# Patient Record
Sex: Female | Born: 1945 | Race: White | Hispanic: No | Marital: Married | Smoking: Never smoker
Health system: Southern US, Community
[De-identification: ages and names within clinical notes are randomized; demographics above are authoritative.]

## PROBLEM LIST (undated history)

## (undated) DIAGNOSIS — F32A Depression, unspecified: Secondary | ICD-10-CM

## (undated) DIAGNOSIS — IMO0002 Reserved for concepts with insufficient information to code with codable children: Secondary | ICD-10-CM

## (undated) DIAGNOSIS — N3289 Other specified disorders of bladder: Secondary | ICD-10-CM

## (undated) DIAGNOSIS — G4730 Sleep apnea, unspecified: Secondary | ICD-10-CM

## (undated) DIAGNOSIS — E7800 Pure hypercholesterolemia, unspecified: Secondary | ICD-10-CM

## (undated) DIAGNOSIS — K219 Gastro-esophageal reflux disease without esophagitis: Secondary | ICD-10-CM

## (undated) DIAGNOSIS — F419 Anxiety disorder, unspecified: Secondary | ICD-10-CM

## (undated) DIAGNOSIS — G5700 Lesion of sciatic nerve, unspecified lower limb: Secondary | ICD-10-CM

## (undated) DIAGNOSIS — M1990 Unspecified osteoarthritis, unspecified site: Secondary | ICD-10-CM

## (undated) DIAGNOSIS — I10 Essential (primary) hypertension: Secondary | ICD-10-CM

## (undated) DIAGNOSIS — Z87442 Personal history of urinary calculi: Secondary | ICD-10-CM

## (undated) HISTORY — DX: Reserved for concepts with insufficient information to code with codable children: IMO0002

## (undated) HISTORY — DX: Gastro-esophageal reflux disease without esophagitis: K21.9

## (undated) HISTORY — PX: UPPER GI ENDOSCOPY: SHX6162

## (undated) HISTORY — PX: ABDOMINAL HYSTERECTOMY: SHX81

## (undated) HISTORY — PX: COLONOSCOPY: SHX174

## (undated) HISTORY — PX: TONSILLECTOMY: SUR1361

## (undated) HISTORY — PX: BREAST BIOPSY: SHX20

## (undated) HISTORY — DX: Lesion of sciatic nerve, unspecified lower limb: G57.00

## (undated) HISTORY — PX: TUBAL LIGATION: SHX77

---

## 1998-01-27 HISTORY — PX: OTHER SURGICAL HISTORY: SHX169

## 1998-08-21 ENCOUNTER — Inpatient Hospital Stay (HOSPITAL_COMMUNITY): Admission: RE | Admit: 1998-08-21 | Discharge: 1998-08-23 | Payer: Self-pay | Admitting: Obstetrics and Gynecology

## 1999-12-10 ENCOUNTER — Other Ambulatory Visit: Admission: RE | Admit: 1999-12-10 | Discharge: 1999-12-10 | Payer: Self-pay | Admitting: Obstetrics and Gynecology

## 2000-12-11 ENCOUNTER — Other Ambulatory Visit: Admission: RE | Admit: 2000-12-11 | Discharge: 2000-12-11 | Payer: Self-pay | Admitting: Obstetrics and Gynecology

## 2003-10-27 ENCOUNTER — Ambulatory Visit (HOSPITAL_COMMUNITY): Admission: RE | Admit: 2003-10-27 | Discharge: 2003-10-27 | Payer: Self-pay | Admitting: Family Medicine

## 2010-10-29 ENCOUNTER — Ambulatory Visit: Payer: Self-pay | Admitting: Gastroenterology

## 2010-10-30 ENCOUNTER — Encounter: Payer: Self-pay | Admitting: Gastroenterology

## 2010-10-30 ENCOUNTER — Other Ambulatory Visit: Payer: Self-pay | Admitting: Gastroenterology

## 2010-10-30 ENCOUNTER — Ambulatory Visit (INDEPENDENT_AMBULATORY_CARE_PROVIDER_SITE_OTHER): Payer: BC Managed Care – PPO | Admitting: Gastroenterology

## 2010-10-30 VITALS — BP 125/75 | HR 70 | Temp 97.0°F | Ht 60.0 in | Wt 225.6 lb

## 2010-10-30 DIAGNOSIS — Z860100 Personal history of colon polyps, unspecified: Secondary | ICD-10-CM

## 2010-10-30 DIAGNOSIS — K5900 Constipation, unspecified: Secondary | ICD-10-CM | POA: Insufficient documentation

## 2010-10-30 DIAGNOSIS — K219 Gastro-esophageal reflux disease without esophagitis: Secondary | ICD-10-CM

## 2010-10-30 MED ORDER — POLYETHYLENE GLYCOL 3350 GRAN: 17.0000 g | GRANULES | Freq: Every day | Status: DC | PRN

## 2010-10-30 NOTE — Progress Notes (Signed)
Cc to PCP 

## 2010-10-30 NOTE — Patient Instructions (Addendum)
We have scheduled you for an upper endoscopy and colonoscopy with Dr. Jena Gauss. Please see separate instructions. Miralax prn constipation.

## 2010-10-30 NOTE — Assessment & Plan Note (Signed)
Intermittent constipation. May take MiraLax 17 g daily as needed. Prescription sent to [facility].

## 2010-10-30 NOTE — Assessment & Plan Note (Signed)
Chronic GERD. No alarm symptoms. She's never had an EGD. She is to have one to rule out Barrett's esophagus.  I have discussed the risks, alternatives, benefits with regards to but not limited to the risk of reaction to medication, bleeding, infection, perforation and the patient is agreeable to proceed. Written consent to be obtained.  Continue omeprazole once daily. May use prn TUMS. May need to change PPI if abnormal EGD.

## 2010-10-30 NOTE — Progress Notes (Signed)
Primary Care Physician:  Criss Rosales, MD  Primary Gastroenterologist:  Roetta Sessions, MD   Chief Complaint  Patient presents with  . Colonoscopy    HPI:  LAHARI SUTTLES is a 65 y.o. female here to schedule colonoscopy. She states it's been more than 12 years since her last colonoscopy with Dr. Jena Gauss. She thinks she had polyps.  Unfortunately her records are not available. She has a history of chronic heartburn. Some breakthrough symptoms on omeprazole. Nexium worked better but cannot afford. No prior EGD. GERD for over 10 years. Brother with bad GERD. Uses TUMS with it. More trouble with bladder than anything, frequency, spasms, nocturnal urination.  No dysphagia.   Current Outpatient Prescriptions  Medication Sig Dispense Refill  . albuterol (PROVENTIL HFA;VENTOLIN HFA) 108 (90 BASE) MCG/ACT inhaler Inhale 2 puffs into the lungs every 6 (six) hours as needed.        Marland Kitchen aspirin 81 MG tablet Take 81 mg by mouth daily.        Marland Kitchen atorvastatin (LIPITOR) 40 MG tablet Take 40 mg by mouth daily.        . celecoxib (CELEBREX) 200 MG capsule Take 200 mg by mouth daily.       . cyclobenzaprine (FLEXERIL) 10 MG tablet Take 10 mg by mouth 3 (three) times daily as needed.       Marland Kitchen estrogens, conjugated, (PREMARIN) 0.625 MG tablet Take 0.625 mg by mouth daily. Takes twice weekly      . Fluticasone-Salmeterol (ADVAIR) 100-50 MCG/DOSE AEPB Inhale 1 puff into the lungs every 12 (twelve) hours.        Marland Kitchen omeprazole (PRILOSEC) 20 MG capsule Take 20 mg by mouth daily.        Marland Kitchen tolterodine (DETROL LA) 4 MG 24 hr capsule Take 4 mg by mouth daily.        Marland Kitchen venlafaxine (EFFEXOR-XR) 150 MG 24 hr capsule Take 150 mg by mouth daily.          Allergies as of 10/30/2010 - Review Complete 10/30/2010  Allergen Reaction Noted  . Penicillins Rash 10/30/2010    Past Medical History  Diagnosis Date  . GERD (gastroesophageal reflux disease)   . DDD (degenerative disc disease)   . Sciatic nerve disease   . Asthma       Past Surgical History  Procedure Date  . Colonoscopy     12 years ago, Dr. Jena Gauss, polyp  . Tonsillectomy   . Complete hysterectomy 2000    fibroids  . Breast biopsy     "precancer"    Family History  Problem Relation Age of Onset  . Colon cancer Maternal Grandmother   . Breast cancer Mother   . Breast cancer Maternal Aunt   . Ovarian cancer Neg Hx   . Heart disease      father, brother  . Lung cancer Maternal Grandfather   . Inflammatory bowel disease Son     ?crohn's, [facility] on Remicade  . GI problems Brother     Bowel perforation    History   Social History  . Marital Status: Single    Spouse Name: N/A    Number of Children: 3  . Years of Education: N/A   Occupational History  . retired     Geologist, engineering   Social History Main Topics  . Smoking status: Never Smoker   . Smokeless tobacco: Not on file  . Alcohol Use: No  . Drug Use: No  . Sexually Active:  Not on file   Other Topics Concern  . Not on file   Social History Narrative  . No narrative on file      ROS:  General: Negative for anorexia, weight loss, fever, chills, fatigue, weakness. Eyes: Negative for vision changes.  ENT: Negative for hoarseness, difficulty swallowing , nasal congestion. CV: Negative for chest pain, angina, palpitations, dyspnea on exertion, peripheral edema.  Respiratory: Negative for dyspnea at rest, dyspnea on exertion, cough, sputum, wheezing.  GI: See history of present illness. GU:  Negative for dysuria, hematuria, urinary incontinence, urinary frequency, nocturnal urination.  MS: Negative for joint pain, low back pain.  Derm: Negative for rash or itching.  Neuro: Negative for weakness, abnormal sensation, seizure, frequent headaches, memory loss, confusion.  Psych: Negative for anxiety, depression, suicidal ideation, hallucinations.  Endo: Negative for unusual weight change.  Heme: Negative for bruising or bleeding. Allergy: Negative for rash or  hives.    Physical Examination:  BP 125/75  Pulse 70  Temp(Src) 97 F (36.1 C) (Temporal)  Ht 5' (1.524 m)  Wt 225 lb 9.6 oz (102.331 kg)  BMI 44.06 kg/m2   General: Well-nourished, well-developed in no acute distress.  Head: Normocephalic, atraumatic.   Eyes: Conjunctiva pink, no icterus. Mouth: Oropharyngeal mucosa moist and pink , no lesions erythema or exudate. Neck: Supple without thyromegaly, masses, or lymphadenopathy.  Lungs: Clear to auscultation bilaterally.  Heart: Regular rate and rhythm, no murmurs rubs or gallops.  Abdomen: Bowel sounds are normal, nontender, nondistended, no hepatosplenomegaly or masses, no abdominal bruits or    hernia , no rebound or guarding.   Rectal: Not performed. Deferred to time of colonoscopy. Extremities: No lower extremity edema. No clubbing or deformities.  Neuro: Alert and oriented x 4 , grossly normal neurologically.  Skin: Warm and dry, no rash or jaundice.   Psych: Alert and cooperative, normal mood and affect.

## 2010-10-30 NOTE — Assessment & Plan Note (Signed)
Due for surveillance colonoscopy.  I have discussed the risks, alternatives, benefits with regards to but not limited to the risk of reaction to medication, bleeding, infection, perforation and the patient is agreeable to proceed. Written consent to be obtained.  

## 2010-11-22 MED ORDER — SODIUM CHLORIDE 0.45 % IV SOLN: Freq: Once | INTRAVENOUS | Status: DC

## 2010-11-25 ENCOUNTER — Encounter (HOSPITAL_COMMUNITY): Payer: Self-pay

## 2010-11-25 ENCOUNTER — Ambulatory Visit (HOSPITAL_COMMUNITY)
Admission: RE | Admit: 2010-11-25 | Discharge: 2010-11-25 | Disposition: A | Discharge status: Home / Self Care | Payer: BC Managed Care – PPO | Source: Ambulatory Visit | Attending: Internal Medicine | Admitting: Internal Medicine

## 2010-11-25 ENCOUNTER — Encounter (HOSPITAL_COMMUNITY)
Admission: RE | Disposition: A | Discharge status: Home / Self Care | Payer: Self-pay | Source: Ambulatory Visit | Attending: Internal Medicine

## 2010-11-25 DIAGNOSIS — K219 Gastro-esophageal reflux disease without esophagitis: Secondary | ICD-10-CM

## 2010-11-25 DIAGNOSIS — Z860100 Personal history of colon polyps, unspecified: Secondary | ICD-10-CM | POA: Insufficient documentation

## 2010-11-25 DIAGNOSIS — Z1211 Encounter for screening for malignant neoplasm of colon: Secondary | ICD-10-CM

## 2010-11-25 HISTORY — DX: Anxiety disorder, unspecified: F41.9

## 2010-11-25 HISTORY — DX: Other specified disorders of bladder: N32.89

## 2010-11-25 HISTORY — DX: Pure hypercholesterolemia, unspecified: E78.00

## 2010-11-25 HISTORY — DX: Sleep apnea, unspecified: G47.30

## 2010-11-25 SURGERY — COLONOSCOPY WITH ESOPHAGOGASTRODUODENOSCOPY (EGD)
Anesthesia: Moderate Sedation

## 2010-11-25 MED ORDER — STERILE WATER FOR IRRIGATION IR SOLN
Status: DC | PRN
  Administered 2010-11-25: 08:00:00

## 2010-11-25 MED ORDER — MIDAZOLAM HCL 5 MG/5ML IJ SOLN
INTRAMUSCULAR | Status: DC | PRN
  Administered 2010-11-25: 1 mg via INTRAVENOUS
  Administered 2010-11-25 (×2): 2 mg via INTRAVENOUS

## 2010-11-25 MED ORDER — MEPERIDINE HCL 100 MG/ML IJ SOLN
INTRAMUSCULAR | Status: DC | PRN
  Administered 2010-11-25 (×3): 25 mg

## 2010-11-25 MED ORDER — BUTAMBEN-TETRACAINE-BENZOCAINE 2-2-14 % EX AERO
INHALATION_SPRAY | CUTANEOUS | Status: DC | PRN
  Administered 2010-11-25: 2 via TOPICAL

## 2010-11-25 MED ORDER — MIDAZOLAM HCL 5 MG/5ML IJ SOLN
INTRAMUSCULAR | Status: AC
  Filled 2010-11-25: qty 10

## 2010-11-25 MED ORDER — LIDOCAINE HCL 2 % EX GEL
CUTANEOUS | Status: DC | PRN
  Administered 2010-11-25: 1 via TOPICAL

## 2010-11-25 MED ORDER — MEPERIDINE HCL 100 MG/ML IJ SOLN
INTRAMUSCULAR | Status: AC
  Filled 2010-11-25: qty 2

## 2010-11-25 MED ORDER — LIDOCAINE HCL 2 % EX GEL
CUTANEOUS | Status: AC
  Filled 2010-11-25: qty 30

## 2010-11-25 NOTE — H&P (Signed)
  I have seen & examined the patient prior to the procedure(s) today and reviewed the history and physical/consultation.  There have been no changes.  After consideration of the risks, benefits, alternatives and imponderables, the patient has consented to the procedure(s).   

## 2010-12-26 NOTE — Progress Notes (Signed)
REVIEWED.  

## 2013-11-07 ENCOUNTER — Encounter: Payer: Self-pay | Admitting: Internal Medicine

## 2016-07-28 NOTE — Patient Instructions (Signed)
Your procedure is scheduled on: 08/04/2016  Report to [facility] at  900  AM.  Call this number if you have problems the morning of surgery: [phone]   Do not eat food or drink liquids :After Midnight.      Take these medicines the morning of surgery with A SIP OF WATER: celexa, flexaril, prilosec, detrol LA, effexor.   Do not wear jewelry, make-up or nail polish.  Do not wear lotions, powders, or perfumes. You may wear deodorant.  Do not shave 48 hours prior to surgery.  Do not bring valuables to the hospital.  Contacts, dentures or bridgework may not be worn into surgery.  Leave suitcase in the car. After surgery it may be brought to your room.  For patients admitted to the hospital, checkout time is 11:00 AM the day of discharge.   Patients discharged the day of surgery will not be allowed to drive home.  :     Please read over the following fact sheets that you were given: Coughing and Deep Breathing, Surgical Site Infection Prevention, Anesthesia Post-op Instructions and Care and Recovery After Surgery    Cataract A cataract is a clouding of the lens of the eye. When a lens becomes cloudy, vision is reduced based on the degree and nature of the clouding. Many cataracts reduce vision to some degree. Some cataracts make people more near-sighted as they develop. Other cataracts increase glare. Cataracts that are ignored and become worse can sometimes look white. The white color can be seen through the pupil. CAUSES   Aging. However, cataracts may occur at any age, even in newborns.   Certain drugs.   Trauma to the eye.   Certain diseases such as diabetes.   Specific eye diseases such as chronic inflammation inside the eye or a sudden attack of a rare form of glaucoma.   Inherited or acquired medical problems.  SYMPTOMS   Gradual, progressive drop in vision in the affected eye.   Severe, rapid visual loss. This most often happens when trauma is the cause.  DIAGNOSIS  To  detect a cataract, an eye doctor examines the lens. Cataracts are best diagnosed with an exam of the eyes with the pupils enlarged (dilated) by drops.  TREATMENT  For an early cataract, vision may improve by using different eyeglasses or stronger lighting. If that does not help your vision, surgery is the only effective treatment. A cataract needs to be surgically removed when vision loss interferes with your everyday activities, such as driving, reading, or watching TV. A cataract may also have to be removed if it prevents examination or treatment of another eye problem. Surgery removes the cloudy lens and usually replaces it with a substitute lens (intraocular lens, IOL).  At a time when both you and your doctor agree, the cataract will be surgically removed. If you have cataracts in both eyes, only one is usually removed at a time. This allows the operated eye to heal and be out of danger from any possible problems after surgery (such as infection or poor wound healing). In rare cases, a cataract may be doing damage to your eye. In these cases, your caregiver may advise surgical removal right away. The vast majority of people who have cataract surgery have better vision afterward. HOME CARE INSTRUCTIONS  If you are not planning surgery, you may be asked to do the following:  Use different eyeglasses.   Use stronger or brighter lighting.   Ask your eye doctor about  reducing your medicine dose or changing medicines if it is thought that a medicine caused your cataract. Changing medicines does not make the cataract go away on its own.   Become familiar with your surroundings. Poor vision can lead to injury. Avoid bumping into things on the affected side. You are at a higher risk for tripping or falling.   Exercise extreme care when driving or operating machinery.   Wear sunglasses if you are sensitive to bright light or experiencing problems with glare.  SEEK IMMEDIATE MEDICAL CARE IF:   You have  a worsening or sudden vision loss.   You notice redness, swelling, or increasing pain in the eye.   You have a fever.  Document Released: 01/13/2005 Document Revised: 01/02/2011 Document Reviewed: 09/06/2010 [facility] Patient Information 2012 [place].PATIENT INSTRUCTIONS POST-ANESTHESIA  IMMEDIATELY FOLLOWING SURGERY:  Do not drive or operate machinery for the first twenty four hours after surgery.  Do not make any important decisions for twenty four hours after surgery or while taking narcotic pain medications or sedatives.  If you develop intractable nausea and vomiting or a severe headache please notify your doctor immediately.  FOLLOW-UP:  Please make an appointment with your surgeon as instructed. You do not need to follow up with anesthesia unless specifically instructed to do so.  WOUND CARE INSTRUCTIONS (if applicable):  Keep a dry clean dressing on the anesthesia/puncture wound site if there is drainage.  Once the wound has quit draining you may leave it open to air.  Generally you should leave the bandage intact for twenty four hours unless there is drainage.  If the epidural site drains for more than 36-48 hours please call the anesthesia department.  QUESTIONS?:  Please feel free to call your physician or the hospital operator if you have any questions, and they will be happy to assist you.

## 2016-07-29 ENCOUNTER — Encounter (HOSPITAL_COMMUNITY)
Admission: RE | Admit: 2016-07-29 | Discharge: 2016-07-29 | Disposition: A | Discharge status: Home / Self Care | Payer: Medicare Other | Source: Ambulatory Visit | Attending: Ophthalmology | Admitting: Ophthalmology

## 2016-07-29 ENCOUNTER — Encounter (HOSPITAL_COMMUNITY): Payer: Self-pay

## 2016-07-29 DIAGNOSIS — E7800 Pure hypercholesterolemia, unspecified: Secondary | ICD-10-CM | POA: Insufficient documentation

## 2016-07-29 DIAGNOSIS — N3289 Other specified disorders of bladder: Secondary | ICD-10-CM | POA: Diagnosis not present

## 2016-07-29 DIAGNOSIS — K5900 Constipation, unspecified: Secondary | ICD-10-CM | POA: Insufficient documentation

## 2016-07-29 DIAGNOSIS — Z01812 Encounter for preprocedural laboratory examination: Secondary | ICD-10-CM | POA: Insufficient documentation

## 2016-07-29 DIAGNOSIS — F419 Anxiety disorder, unspecified: Secondary | ICD-10-CM | POA: Insufficient documentation

## 2016-07-29 DIAGNOSIS — Z01810 Encounter for preprocedural cardiovascular examination: Secondary | ICD-10-CM | POA: Insufficient documentation

## 2016-07-29 DIAGNOSIS — Z86010 Personal history of colonic polyps: Secondary | ICD-10-CM | POA: Insufficient documentation

## 2016-07-29 DIAGNOSIS — K219 Gastro-esophageal reflux disease without esophagitis: Secondary | ICD-10-CM | POA: Insufficient documentation

## 2016-07-29 DIAGNOSIS — I252 Old myocardial infarction: Secondary | ICD-10-CM | POA: Diagnosis not present

## 2016-07-29 LAB — CBC
HCT: 44.9 % (ref 36.0–46.0)
Hemoglobin: 14.8 g/dL (ref 12.0–15.0)
MCH: 32 pg (ref 26.0–34.0)
MCHC: 33 g/dL (ref 30.0–36.0)
MCV: 97 fL (ref 78.0–100.0)
PLATELETS: 233 10*3/uL (ref 150–400)
RBC: 4.63 MIL/uL (ref 3.87–5.11)
RDW: 13.8 % (ref 11.5–15.5)
WBC: 6.4 10*3/uL (ref 4.0–10.5)

## 2016-07-29 LAB — BASIC METABOLIC PANEL
Anion gap: 12 (ref 5–15)
BUN: 16 mg/dL (ref 6–20)
CALCIUM: 10.2 mg/dL (ref 8.9–10.3)
CO2: 24 mmol/L (ref 22–32)
CREATININE: 0.57 mg/dL (ref 0.44–1.00)
Chloride: 105 mmol/L (ref 101–111)
GFR calc Af Amer: >60 mL/min (ref >60)
Glucose, Bld: 94 mg/dL (ref 65–99)
POTASSIUM: 3.7 mmol/L (ref 3.5–5.1)
SODIUM: 141 mmol/L (ref 135–145)

## 2016-08-04 ENCOUNTER — Ambulatory Visit (HOSPITAL_COMMUNITY): Payer: Medicare Other | Admitting: Anesthesiology

## 2016-08-04 ENCOUNTER — Ambulatory Visit (HOSPITAL_COMMUNITY)
Admission: RE | Admit: 2016-08-04 | Discharge: 2016-08-04 | Disposition: A | Discharge status: Home / Self Care | Payer: Medicare Other | Source: Ambulatory Visit | Attending: Ophthalmology | Admitting: Ophthalmology

## 2016-08-04 ENCOUNTER — Encounter (HOSPITAL_COMMUNITY)
Admission: RE | Disposition: A | Discharge status: Home / Self Care | Payer: Self-pay | Source: Ambulatory Visit | Attending: Ophthalmology

## 2016-08-04 ENCOUNTER — Encounter (HOSPITAL_COMMUNITY): Payer: Self-pay

## 2016-08-04 DIAGNOSIS — K219 Gastro-esophageal reflux disease without esophagitis: Secondary | ICD-10-CM | POA: Insufficient documentation

## 2016-08-04 DIAGNOSIS — F17200 Nicotine dependence, unspecified, uncomplicated: Secondary | ICD-10-CM | POA: Diagnosis not present

## 2016-08-04 DIAGNOSIS — G4730 Sleep apnea, unspecified: Secondary | ICD-10-CM | POA: Insufficient documentation

## 2016-08-04 DIAGNOSIS — H25042 Posterior subcapsular polar age-related cataract, left eye: Secondary | ICD-10-CM | POA: Diagnosis present

## 2016-08-04 DIAGNOSIS — F419 Anxiety disorder, unspecified: Secondary | ICD-10-CM | POA: Insufficient documentation

## 2016-08-04 DIAGNOSIS — J45909 Unspecified asthma, uncomplicated: Secondary | ICD-10-CM | POA: Insufficient documentation

## 2016-08-04 DIAGNOSIS — M1990 Unspecified osteoarthritis, unspecified site: Secondary | ICD-10-CM | POA: Insufficient documentation

## 2016-08-04 HISTORY — PX: CATARACT EXTRACTION W/PHACO: SHX586

## 2016-08-04 SURGERY — PHACOEMULSIFICATION, CATARACT, WITH IOL INSERTION
Anesthesia: Monitor Anesthesia Care | Site: Eye | Laterality: Left

## 2016-08-04 MED ORDER — LIDOCAINE HCL 3.5 % OP GEL
1.0000 "application " | Freq: Once | OPHTHALMIC | Status: AC
  Administered 2016-08-04: 1 via OPHTHALMIC

## 2016-08-04 MED ORDER — NEOMYCIN-POLYMYXIN-DEXAMETH 3.5-10000-0.1 OP SUSP
OPHTHALMIC | Status: DC | PRN
  Administered 2016-08-04: 2 [drp] via OPHTHALMIC

## 2016-08-04 MED ORDER — PHENYLEPHRINE HCL 2.5 % OP SOLN
1.0000 [drp] | OPHTHALMIC | Status: AC
Start: 2016-08-04 — End: 2016-08-04
  Administered 2016-08-04 (×3): 1 [drp] via OPHTHALMIC

## 2016-08-04 MED ORDER — PROVISC 10 MG/ML IO SOLN
INTRAOCULAR | Status: DC | PRN
  Administered 2016-08-04: 0.85 mL via INTRAOCULAR

## 2016-08-04 MED ORDER — FENTANYL CITRATE (PF) 100 MCG/2ML IJ SOLN
25.0000 ug | Freq: Once | INTRAMUSCULAR | Status: AC
  Administered 2016-08-04: 25 ug via INTRAVENOUS
  Filled 2016-08-04: qty 2

## 2016-08-04 MED ORDER — LIDOCAINE HCL (PF) 1 % IJ SOLN
INTRAMUSCULAR | Status: DC | PRN
  Administered 2016-08-04: .7 mL

## 2016-08-04 MED ORDER — CYCLOPENTOLATE-PHENYLEPHRINE 0.2-1 % OP SOLN
1.0000 [drp] | OPHTHALMIC | Status: AC
  Administered 2016-08-04 (×3): 1 [drp] via OPHTHALMIC

## 2016-08-04 MED ORDER — MIDAZOLAM HCL 2 MG/2ML IJ SOLN
1.0000 mg | INTRAMUSCULAR | Status: AC
  Administered 2016-08-04 (×2): 2 mg via INTRAVENOUS
  Filled 2016-08-04 (×2): qty 2

## 2016-08-04 MED ORDER — LACTATED RINGERS IV SOLN
INTRAVENOUS | Status: DC
  Administered 2016-08-04: 10:00:00 via INTRAVENOUS

## 2016-08-04 MED ORDER — TETRACAINE HCL 0.5 % OP SOLN
1.0000 [drp] | OPHTHALMIC | Status: AC
Start: 2016-08-04 — End: 2016-08-04
  Administered 2016-08-04 (×3): 1 [drp] via OPHTHALMIC

## 2016-08-04 MED ORDER — EPINEPHRINE PF 1 MG/ML IJ SOLN
INTRAOCULAR | Status: DC | PRN
  Administered 2016-08-04: 500 mL

## 2016-08-04 MED ORDER — BSS IO SOLN
INTRAOCULAR | Status: DC | PRN
  Administered 2016-08-04: 15 mL

## 2016-08-04 MED ORDER — POVIDONE-IODINE 5 % OP SOLN
OPHTHALMIC | Status: DC | PRN
  Administered 2016-08-04: 1 via OPHTHALMIC

## 2016-08-04 SURGICAL SUPPLY — 10 items

## 2016-08-04 NOTE — Discharge Instructions (Signed)

## 2016-08-04 NOTE — Anesthesia Preprocedure Evaluation (Signed)
Anesthesia Evaluation  Patient identified by MRN, date of birth, ID band Patient awake    Reviewed: Allergy & Precautions, NPO status , Patient's Chart, lab work & pertinent test results  Airway Mallampati: II  TM Distance: >3 FB Neck ROM: Full    Dental  (+) Teeth Intact   Pulmonary asthma , sleep apnea , Current Smoker,    breath sounds clear to auscultation       Cardiovascular negative cardio ROS   Rhythm:Regular Rate:Normal     Neuro/Psych PSYCHIATRIC DISORDERS Anxiety  Neuromuscular disease    GI/Hepatic GERD  Controlled and Medicated,  Endo/Other  Morbid obesity  Renal/GU      Musculoskeletal  (+) Arthritis ,   Abdominal   Peds  Hematology   Anesthesia Other Findings   Reproductive/Obstetrics                             Anesthesia Physical Anesthesia Plan  ASA: III  Anesthesia Plan: MAC   Post-op Pain Management:    Induction: Intravenous  PONV Risk Score and Plan:   Airway Management Planned: Nasal Cannula  Additional Equipment:   Intra-op Plan:   Post-operative Plan:   Informed Consent: I have reviewed the patients History and Physical, chart, labs and discussed the procedure including the risks, benefits and alternatives for the proposed anesthesia with the patient or authorized representative who has indicated his/her understanding and acceptance.     Plan Discussed with:   Anesthesia Plan Comments:         Anesthesia Quick Evaluation  

## 2016-08-04 NOTE — Op Note (Signed)
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  Assistants: None  Anesthesia: Topical with MAC  Indications: Painless, progressive loss of vision with compromise of daily activities.  Surgery: Cataract Extraction with Intraocular lens Implant Left Eye  Discription: The patient had dilating drops and viscous lidocaine placed into the Left eye in the pre-op holding area. After transfer to the operating room, a time out was performed. The patient was then prepped and draped. Beginning with a 39m blade a paracentesis port was made at the surgeon's 2 o'clock position. The anterior chamber was then filled with 1% non-preserved lidocaine. This was followed by filling the anterior chamber with Provisc.  A 2.434mkeratome blade was used to make a clear corneal incision at the temporal limbus.  A bent cystatome needle was used to create a continuous tear capsulotomy. Hydrodissection was performed with balanced salt solution on a Fine canula. The lens nucleus was then removed using the phacoemulsification handpiece. Residual cortex was removed with the I&A handpiece. The anterior chamber and capsular bag were refilled with Provisc. A posterior chamber intraocular lens was placed into the capsular bag with it's injector. The implant was positioned with the Kuglan hook. The Provisc was then removed from the anterior chamber and capsular bag with the I&A handpiece. Stromal hydration of the main incision and paracentesis port was performed with BSS on a Fine canula. The wounds were tested for leak which was negative. The patient tolerated the procedure well. There were no operative complications. The patient was then transferred to the recovery room in stable condition.  Complications: None  Specimen: None  EBL: None  Prosthetic device: Abbott Technis, PCB00,  power 24.0, SN [id number]

## 2016-08-04 NOTE — Transfer of Care (Signed)
Immediate Anesthesia Transfer of Care Note  Patient: Barbara [name]  Procedure(s) Performed: Procedure(s) (LRB): CATARACT EXTRACTION PHACO AND INTRAOCULAR LENS PLACEMENT (IOC) (Left)  Patient Location: Shortstay  Anesthesia Type: MAC  Level of Consciousness: awake  Airway & Oxygen Therapy: Patient Spontanous Breathing   Post-op Assessment: Report given to PACU RN, Post -op Vital signs reviewed and stable and Patient moving all extremities  Post vital signs: Reviewed and stable  Complications: No apparent anesthesia complications

## 2016-08-04 NOTE — Anesthesia Procedure Notes (Signed)
Procedure Name: MAC Date/Time: 08/04/2016 10:23 AM Performed by: Vista Deck Pre-anesthesia Checklist: Patient identified, Emergency Drugs available, Suction available, Timeout performed and Patient being monitored Patient Re-evaluated:Patient Re-evaluated prior to inductionOxygen Delivery Method: Nasal Cannula

## 2016-08-04 NOTE — H&P (Signed)
I have reviewed the H&P, the patient was re-examined, and I have identified no interval changes in medical condition and plan of care since the history and physical of record  

## 2016-08-04 NOTE — Anesthesia Postprocedure Evaluation (Signed)
Anesthesia Post Note  Patient: Barbara [name]  Procedure(s) Performed: Procedure(s) (LRB): CATARACT EXTRACTION PHACO AND INTRAOCULAR LENS PLACEMENT (IOC) (Left)  Patient location during evaluation: Short Stay Anesthesia Type: MAC Level of consciousness: awake and alert Pain management: pain level controlled Vital Signs Assessment: post-procedure vital signs reviewed and stable Respiratory status: spontaneous breathing Cardiovascular status: stable Postop Assessment: no signs of nausea or vomiting Anesthetic complications: no     Last Vitals:  Vitals:   08/04/16 1010 08/04/16 1040  BP: 135/71 133/67  Pulse:  63  Resp: (!) 60 (!) 62  Temp:  37.1 C    Last Pain:  Vitals:   08/04/16 1040  TempSrc: Oral                 Bethannie Iglehart

## 2016-08-05 ENCOUNTER — Encounter (HOSPITAL_COMMUNITY): Payer: Self-pay | Admitting: Ophthalmology

## 2016-08-13 ENCOUNTER — Encounter (HOSPITAL_COMMUNITY): Payer: Self-pay

## 2016-08-14 ENCOUNTER — Encounter (HOSPITAL_COMMUNITY)
Admission: RE | Admit: 2016-08-14 | Discharge: 2016-08-14 | Disposition: A | Discharge status: Home / Self Care | Payer: Medicare Other | Source: Ambulatory Visit | Attending: Ophthalmology | Admitting: Ophthalmology

## 2016-08-15 ENCOUNTER — Encounter (HOSPITAL_COMMUNITY): Payer: Self-pay | Admitting: *Deleted

## 2016-08-18 ENCOUNTER — Ambulatory Visit (HOSPITAL_COMMUNITY): Payer: Medicare Other | Admitting: Anesthesiology

## 2016-08-18 ENCOUNTER — Ambulatory Visit (HOSPITAL_COMMUNITY)
Admission: RE | Admit: 2016-08-18 | Discharge: 2016-08-18 | Disposition: A | Discharge status: Home / Self Care | Payer: Medicare Other | Source: Ambulatory Visit | Attending: Ophthalmology | Admitting: Ophthalmology

## 2016-08-18 ENCOUNTER — Encounter (HOSPITAL_COMMUNITY)
Admission: RE | Disposition: A | Discharge status: Home / Self Care | Payer: Self-pay | Source: Ambulatory Visit | Attending: Ophthalmology

## 2016-08-18 ENCOUNTER — Encounter (HOSPITAL_COMMUNITY): Payer: Self-pay | Admitting: *Deleted

## 2016-08-18 DIAGNOSIS — J45909 Unspecified asthma, uncomplicated: Secondary | ICD-10-CM | POA: Insufficient documentation

## 2016-08-18 DIAGNOSIS — M1990 Unspecified osteoarthritis, unspecified site: Secondary | ICD-10-CM | POA: Diagnosis not present

## 2016-08-18 DIAGNOSIS — Z880 Allergy status to penicillin: Secondary | ICD-10-CM | POA: Insufficient documentation

## 2016-08-18 DIAGNOSIS — K219 Gastro-esophageal reflux disease without esophagitis: Secondary | ICD-10-CM | POA: Diagnosis not present

## 2016-08-18 DIAGNOSIS — H25811 Combined forms of age-related cataract, right eye: Secondary | ICD-10-CM | POA: Insufficient documentation

## 2016-08-18 DIAGNOSIS — G4730 Sleep apnea, unspecified: Secondary | ICD-10-CM | POA: Diagnosis not present

## 2016-08-18 DIAGNOSIS — F419 Anxiety disorder, unspecified: Secondary | ICD-10-CM | POA: Insufficient documentation

## 2016-08-18 HISTORY — PX: CATARACT EXTRACTION W/PHACO: SHX586

## 2016-08-18 SURGERY — PHACOEMULSIFICATION, CATARACT, WITH IOL INSERTION
Anesthesia: Monitor Anesthesia Care | Site: Eye | Laterality: Right

## 2016-08-18 MED ORDER — PHENYLEPHRINE HCL 2.5 % OP SOLN
1.0000 [drp] | OPHTHALMIC | Status: AC
  Administered 2016-08-18 (×3): 1 [drp] via OPHTHALMIC

## 2016-08-18 MED ORDER — MIDAZOLAM HCL 2 MG/2ML IJ SOLN
1.0000 mg | INTRAMUSCULAR | Status: AC
  Administered 2016-08-18: 2 mg via INTRAVENOUS
  Filled 2016-08-18: qty 2

## 2016-08-18 MED ORDER — FENTANYL CITRATE (PF) 100 MCG/2ML IJ SOLN
25.0000 ug | Freq: Once | INTRAMUSCULAR | Status: AC
  Administered 2016-08-18: 25 ug via INTRAVENOUS
  Filled 2016-08-18: qty 2

## 2016-08-18 MED ORDER — LIDOCAINE HCL (PF) 1 % IJ SOLN
INTRAMUSCULAR | Status: DC | PRN
  Administered 2016-08-18: .5 mL

## 2016-08-18 MED ORDER — BSS IO SOLN
INTRAOCULAR | Status: DC | PRN
  Administered 2016-08-18: 15 mL via INTRAOCULAR

## 2016-08-18 MED ORDER — CYCLOPENTOLATE-PHENYLEPHRINE 0.2-1 % OP SOLN
1.0000 [drp] | OPHTHALMIC | Status: AC
  Administered 2016-08-18 (×3): 1 [drp] via OPHTHALMIC

## 2016-08-18 MED ORDER — LIDOCAINE HCL 3.5 % OP GEL
1.0000 "application " | Freq: Once | OPHTHALMIC | Status: AC
  Administered 2016-08-18: 1 via OPHTHALMIC

## 2016-08-18 MED ORDER — EPINEPHRINE PF 1 MG/ML IJ SOLN
INTRAMUSCULAR | Status: AC
  Filled 2016-08-18: qty 1

## 2016-08-18 MED ORDER — TETRACAINE HCL 0.5 % OP SOLN
1.0000 [drp] | OPHTHALMIC | Status: AC
  Administered 2016-08-18 (×3): 1 [drp] via OPHTHALMIC

## 2016-08-18 MED ORDER — NEOMYCIN-POLYMYXIN-DEXAMETH 3.5-10000-0.1 OP SUSP
OPHTHALMIC | Status: DC | PRN
  Administered 2016-08-18: 2 [drp] via OPHTHALMIC

## 2016-08-18 MED ORDER — LIDOCAINE HCL (PF) 1 % IJ SOLN
INTRAMUSCULAR | Status: AC
  Filled 2016-08-18: qty 2

## 2016-08-18 MED ORDER — EPINEPHRINE PF 1 MG/ML IJ SOLN
INTRAOCULAR | Status: DC | PRN
  Administered 2016-08-18: 500 mL

## 2016-08-18 MED ORDER — LACTATED RINGERS IV SOLN
INTRAVENOUS | Status: DC
  Administered 2016-08-18: 1000 mL via INTRAVENOUS

## 2016-08-18 MED ORDER — POVIDONE-IODINE 5 % OP SOLN
OPHTHALMIC | Status: DC | PRN
  Administered 2016-08-18: 1 via OPHTHALMIC

## 2016-08-18 MED ORDER — PROVISC 10 MG/ML IO SOLN
INTRAOCULAR | Status: DC | PRN
  Administered 2016-08-18: 0.85 mL via INTRAOCULAR

## 2016-08-18 SURGICAL SUPPLY — 18 items
CAPSULAR TENSION RING-AMO (OPHTHALMIC RELATED) IMPLANT
CLOTH BEACON ORANGE TIMEOUT ST (SAFETY) ×3 IMPLANT
EYE SHIELD UNIVERSAL CLEAR (GAUZE/BANDAGES/DRESSINGS) ×3 IMPLANT
GLOVE BIOGEL PI IND STRL 6.5 (GLOVE) ×1 IMPLANT
GLOVE BIOGEL PI IND STRL 7.0 (GLOVE) ×1 IMPLANT
GLOVE BIOGEL PI INDICATOR 6.5 (GLOVE) ×2
GLOVE BIOGEL PI INDICATOR 7.0 (GLOVE) ×2
PAD ARMBOARD 7.5X6 YLW CONV (MISCELLANEOUS) ×3 IMPLANT
PROC W SPEC LENS (INTRAOCULAR LENS)
PROCESS W SPEC LENS (INTRAOCULAR LENS) IMPLANT
RETRACTOR IRIS SIGHTPATH (OPHTHALMIC RELATED) IMPLANT
RING MALYGIN (MISCELLANEOUS) IMPLANT
SIGHTPATH CAT PROC W REG LENS (Ophthalmic Related) ×3 IMPLANT
SYRINGE LUER LOK 1CC (MISCELLANEOUS) ×3 IMPLANT
TAPE SURG TRANSPORE 1 IN (GAUZE/BANDAGES/DRESSINGS) ×1 IMPLANT
TAPE SURGICAL TRANSPORE 1 IN (GAUZE/BANDAGES/DRESSINGS) ×2
VISCOELASTIC ADDITIONAL (OPHTHALMIC RELATED) IMPLANT
WATER STERILE IRR 250ML POUR (IV SOLUTION) ×3 IMPLANT

## 2016-08-18 NOTE — H&P (Signed)
I have reviewed the H&P, the patient was re-examined, and I have identified no interval changes in medical condition and plan of care since the history and physical of record  

## 2016-08-18 NOTE — Op Note (Signed)
Date of Admission: 08/18/2016  Date of Surgery: 08/18/2016  Pre-Op Dx: Cataract Right  Eye  Post-Op Dx: Senile Combined Cataract  Right  Eye,  Dx Code X54.008  Surgeon: Tonny Branch, M.D.  Assistants: None  Anesthesia: Topical with MAC  Indications: Painless, progressive loss of vision with compromise of daily activities.  Surgery: Cataract Extraction with Intraocular lens Implant Right Eye  Discription: The patient had dilating drops and viscous lidocaine placed into the Right eye in the pre-op holding area. After transfer to the operating room, a time out was performed. The patient was then prepped and draped. Beginning with a 32m blade a paracentesis port was made at the surgeon's 2 o'clock position. The anterior chamber was then filled with 1% non-preserved lidocaine. This was followed by filling the anterior chamber with Provisc.  A 2.459mkeratome blade was used to make a clear corneal incision at the temporal limbus.  A bent cystatome needle was used to create a continuous tear capsulotomy. Hydrodissection was performed with balanced salt solution on a Fine canula. The lens nucleus was then removed using the phacoemulsification handpiece. Residual cortex was removed with the I&A handpiece. The anterior chamber and capsular bag were refilled with Provisc. A posterior chamber intraocular lens was placed into the capsular bag with it's injector. The implant was positioned with the Kuglan hook. The Provisc was then removed from the anterior chamber and capsular bag with the I&A handpiece. Stromal hydration of the main incision and paracentesis port was performed with BSS on a Fine canula. The wounds were tested for leak which was negative. The patient tolerated the procedure well. There were no operative complications. The patient was then transferred to the recovery room in stable condition.  Complications: None  Specimen: None  EBL: None  Prosthetic device: Abbott Technis, PCB00, power  24.0, SN [id number]

## 2016-08-18 NOTE — Anesthesia Postprocedure Evaluation (Signed)
Anesthesia Post Note  Patient: Barbara [name]  Procedure(s) Performed: Procedure(s) (LRB): CATARACT EXTRACTION PHACO AND INTRAOCULAR LENS PLACEMENT RIGHT EYE (Right)  Patient location during evaluation: Short Stay Anesthesia Type: MAC Level of consciousness: awake and alert and oriented Pain management: pain level controlled Vital Signs Assessment: post-procedure vital signs reviewed and stable Respiratory status: spontaneous breathing Cardiovascular status: stable Postop Assessment: no signs of nausea or vomiting Anesthetic complications: no     Last Vitals:  Vitals:   08/18/16 1115 08/18/16 1120  BP: (!) 105/59   Resp: (!) 25 16  Temp:      Last Pain:  Vitals:   08/18/16 1049  TempSrc: Oral                 ADAMS, AMY A

## 2016-08-18 NOTE — Transfer of Care (Signed)
Immediate Anesthesia Transfer of Care Note  Patient: Barbara [name]  Procedure(s) Performed: Procedure(s) with comments: CATARACT EXTRACTION PHACO AND INTRAOCULAR LENS PLACEMENT RIGHT EYE (Right) - CDE: 8.30   Patient Location: Short Stay  Anesthesia Type:MAC  Level of Consciousness: awake, alert  and oriented  Airway & Oxygen Therapy: Patient Spontanous Breathing  Post-op Assessment: Report given to RN and Post -op Vital signs reviewed and stable  Post vital signs: Reviewed and stable  Last Vitals:  Vitals:   08/18/16 1115 08/18/16 1120  BP: (!) 105/59   Resp: (!) 25 16  Temp:      Last Pain:  Vitals:   08/18/16 1049  TempSrc: Oral      Patients Stated Pain Goal: 6 (12/12/50 0802)  Complications: No apparent anesthesia complications

## 2016-08-18 NOTE — Discharge Instructions (Signed)
Anesthesia, Adult, Care After °These instructions provide you with information about caring for yourself after your procedure. Your health care provider may also give you more specific instructions. Your treatment has been planned according to current medical practices, but problems sometimes occur. Call your health care provider if you have any problems or questions after your procedure. °What can I expect after the procedure? °After the procedure, it is common to have: °· Vomiting. °· A sore throat. °· Mental slowness. ° °It is common to feel: °· Nauseous. °· Cold or shivery. °· Sleepy. °· Tired. °· Sore or achy, even in parts of your body where you did not have surgery. ° °Follow these instructions at home: °For at least 24 hours after the procedure: °· Do not: °? Participate in activities where you could fall or become injured. °? Drive. °? Use heavy machinery. °? Drink alcohol. °? Take sleeping pills or medicines that cause drowsiness. °? Make important decisions or sign legal documents. °? Take care of children on your own. °· Rest. °Eating and drinking °· If you vomit, drink water, juice, or soup when you can drink without vomiting. °· Drink enough fluid to keep your urine clear or pale yellow. °· Make sure you have little or no nausea before eating solid foods. °· Follow the diet recommended by your health care provider. °General instructions °· Have a responsible adult stay with you until you are awake and alert. °· Return to your normal activities as told by your health care provider. Ask your health care provider what activities are safe for you. °· Take over-the-counter and prescription medicines only as told by your health care provider. °· If you smoke, do not smoke without supervision. °· Keep all follow-up visits as told by your health care provider. This is important. °Contact a health care provider if: °· You continue to have nausea or vomiting at home, and medicines are not helpful. °· You cannot  drink fluids or start eating again. °· You cannot urinate after 8-12 hours. °· You develop a skin rash. °· You have fever. °· You have increasing redness at the site of your procedure. °Get help right away if: °· You have difficulty breathing. °· You have chest pain. °· You have unexpected bleeding. °· You feel that you are having a life-threatening or urgent problem. °This information is not intended to replace advice given to you by your health care provider. Make sure you discuss any questions you have with your health care provider. °Document Released: 04/21/2000 Document Revised: 06/18/2015 Document Reviewed: 12/28/2014 °Elsevier Interactive Patient Education © 2018 [employer] ° °

## 2016-08-18 NOTE — Anesthesia Procedure Notes (Signed)
Procedure Name: MAC Date/Time: 08/18/2016 11:21 AM Performed by: Andree Elk, Mickelle Goupil A Pre-anesthesia Checklist: Patient identified, Timeout performed, Emergency Drugs available, Suction available and Patient being monitored Oxygen Delivery Method: Nasal cannula

## 2016-08-18 NOTE — Anesthesia Preprocedure Evaluation (Signed)
Anesthesia Evaluation  Patient identified by MRN, date of birth, ID band Patient awake    Reviewed: Allergy & Precautions, NPO status , Patient's Chart, lab work & pertinent test results  Airway Mallampati: II  TM Distance: >3 FB Neck ROM: Full    Dental  (+) Teeth Intact   Pulmonary asthma , sleep apnea , Current Smoker,    breath sounds clear to auscultation       Cardiovascular negative cardio ROS   Rhythm:Regular Rate:Normal     Neuro/Psych PSYCHIATRIC DISORDERS Anxiety  Neuromuscular disease    GI/Hepatic GERD  Controlled and Medicated,  Endo/Other  Morbid obesity  Renal/GU      Musculoskeletal  (+) Arthritis ,   Abdominal   Peds  Hematology   Anesthesia Other Findings   Reproductive/Obstetrics                             Anesthesia Physical Anesthesia Plan  ASA: III  Anesthesia Plan: MAC   Post-op Pain Management:    Induction: Intravenous  PONV Risk Score and Plan:   Airway Management Planned: Nasal Cannula  Additional Equipment:   Intra-op Plan:   Post-operative Plan:   Informed Consent: I have reviewed the patients History and Physical, chart, labs and discussed the procedure including the risks, benefits and alternatives for the proposed anesthesia with the patient or authorized representative who has indicated his/her understanding and acceptance.     Plan Discussed with:   Anesthesia Plan Comments:         Anesthesia Quick Evaluation

## 2016-08-19 ENCOUNTER — Encounter (HOSPITAL_COMMUNITY): Payer: Self-pay | Admitting: Ophthalmology

## 2017-02-04 ENCOUNTER — Ambulatory Visit (HOSPITAL_COMMUNITY): Payer: Medicare Other | Attending: Orthopedic Surgery

## 2017-02-04 ENCOUNTER — Encounter (HOSPITAL_COMMUNITY): Payer: Self-pay

## 2017-02-04 ENCOUNTER — Other Ambulatory Visit: Payer: Self-pay

## 2017-02-04 DIAGNOSIS — M25675 Stiffness of left foot, not elsewhere classified: Secondary | ICD-10-CM

## 2017-02-04 DIAGNOSIS — R29898 Other symptoms and signs involving the musculoskeletal system: Secondary | ICD-10-CM | POA: Diagnosis present

## 2017-02-04 DIAGNOSIS — M79672 Pain in left foot: Secondary | ICD-10-CM | POA: Insufficient documentation

## 2017-02-04 NOTE — Therapy (Signed)
[facility] [name] [place], [place], [address] Phone: [phone]   Fax:  [phone]  Physical Therapy Evaluation  Patient Details  Name: Barbara [name] MRN: [id number] Date of Birth: 10-May-1945 Referring Provider: Toni Arthurs   Encounter Date: 02/04/2017  PT End of Session - 02/04/17 0926    Visit Number  1    Number of Visits  19    Date for PT Re-Evaluation  02/25/17    Authorization Type  [employer]    Authorization Time Period  02/04/17 - 03/18/17    PT Start Time  0821    PT Stop Time  0908    PT Time Calculation (min)  47 min    Activity Tolerance  Patient tolerated treatment well    Behavior During Therapy  [facility] for tasks assessed/performed       Past Medical History:  Diagnosis Date  . Anxiety   . Asthma   . Bladder spasms   . DDD (degenerative disc disease)   . GERD (gastroesophageal reflux disease)   . Hypercholesteremia   . Sciatic nerve disease   . Sleep apnea     Past Surgical History:  Procedure Laterality Date  . ABDOMINAL HYSTERECTOMY    . BREAST BIOPSY     "precancer"-left  . CATARACT EXTRACTION W/PHACO Left 08/04/2016   Procedure: CATARACT EXTRACTION PHACO AND INTRAOCULAR LENS PLACEMENT (IOC);  Surgeon: Gemma Payor, MD;  Location: [facility];  Service: Ophthalmology;  Laterality: Left;  CDE: 5.48  . CATARACT EXTRACTION W/PHACO Right 08/18/2016   Procedure: CATARACT EXTRACTION PHACO AND INTRAOCULAR LENS PLACEMENT RIGHT EYE;  Surgeon: Gemma Payor, MD;  Location: [facility];  Service: Ophthalmology;  Laterality: Right;  CDE: 8.30   . COLONOSCOPY     12 years ago, Dr. Jena Gauss, polyp  . complete hysterectomy  2000   fibroids  . TONSILLECTOMY    . TUBAL LIGATION      There were no vitals filed for this visit.   Subjective Assessment - 02/04/17 0828    Subjective  Patient denies any injuries to her foot, and reports he pain starting getting worse in September around the back of her heel. She  reports when she was walking around hospital for her husband's stem cell appointment she started having severe foot pain after ~ 5 minutes of walking and could not put weight on it for ~ 2 day. She reports this happened again on December 1st after walking about 200 yards total to go to her brothers house. She reports after 200 yards she had to walk very slowly putting only a small amount of weight on her left foot. When she arrived to his house she felt sick from the pain and had to stay off it for about 1.5 days. She states when it gets that painful she feels sick to her stomach and rates it a 10/10. She reports going to her doctor and having x-rays taken. They informed her she has calcifications on her heel and that the doctor said her bone spur on the back/bottom of her heal might be causing the pain.  She denies numbness and tingling in feet and denies falls. She describes the 10/10 pain as shooting sharp pain in the bottom of her heel and indicates the area of tibial nerve innervation.  She reports when her pain is bad it wakes her up at night but she can go back to sleep after several minutes.    Limitations  Walking;Standing;House hold  activities    How long can you sit comfortably?  unlimited    How long can you stand comfortably?  30 minutes at most    How long can you walk comfortably?  5 minutes, 200 yards    Patient Stated Goals  waling further with no pain, at least 10 minues pain free    Currently in Pain?  No/denies    Multiple Pain Sites  No         [facility] PT Assessment - 02/04/17 0001      Assessment   Medical Diagnosis  Heel/Achilles Pain    Referring Provider  Toni ArthursHewitt, John    Onset Date/Surgical Date  09/27/16 approximate    Hand Dominance  Left    Next MD Visit  2 months from now    Prior Therapy  shoulder and low back pain, ~ 2 years ago      Precautions   Precautions  None      Restrictions   Weight Bearing Restrictions  No      Balance Screen   Has the patient  fallen in the past 6 months  No    Has the patient had a decrease in activity level because of a fear of falling?   Yes    Is the patient reluctant to leave their home because of a fear of falling?   No      Home Environment   Living Environment  Private residence    Living Arrangements  Spouse/significant other    Available Help at Discharge  Family    Type of Home  House    Home Access  Stairs to enter    Entrance Stairs-Number of Steps  5    Entrance Stairs-Rails  Right    Home Layout  Two level    Alternate Level Stairs-Number of Steps  3    Alternate Level Stairs-Rails  None    Home Equipment  None      Prior Function   Level of Independence  Independent;Independent with basic ADLs    Vocation  Retired    Leisure  throw the ball with dog, walking her dog, swimming in summer time      Cognition   Overall Cognitive Status  Within Functional Limits for tasks assessed      Observation/Other Assessments   Focus on Therapeutic Outcomes (FOTO)   52% limited      Sensation   Light Touch  Appears Intact      Functional Tests   Functional tests  Squat;Single leg stance;Other      Squat   Comments  patient with decreased depth and early heel ris, feet externally rotated with pes cavus in standing      Single Leg Stance   Comments  RLE: 3-5 seconds; LLE: 2-3 seconds      Other:   Other/ Comments  patient walking with feet externally rotated, denies pain with/without shoes on, feet pronated bilaterally heel/toe walking      Posture/Postural Control   Posture/Postural Control  No significant limitations      AROM   Right Ankle Dorsiflexion  10    Right Ankle Plantar Flexion  40    Right Ankle Inversion  30    Right Ankle Eversion  22    Left Ankle Dorsiflexion  3    Left Ankle Plantar Flexion  38    Left Ankle Inversion  28    Left Ankle Eversion  18  Strength   Overall Strength Comments  painfree with resistance throughout    Right Hip Flexion  5/5    Right Hip  Extension  4+/5    Right Hip ABduction  4/5    Left Hip Flexion  5/5    Left Hip Extension  4/5    Left Hip ABduction  4/5    Right Knee Flexion  5/5    Right Knee Extension  5/5    Left Knee Flexion  5/5    Left Knee Extension  5/5    Right Ankle Dorsiflexion  5/5    Right Ankle Plantar Flexion  5/5    Right Ankle Inversion  5/5    Right Ankle Eversion  5/5    Left Ankle Dorsiflexion  5/5    Left Ankle Plantar Flexion  5/5    Left Ankle Inversion  5/5    Left Ankle Eversion  5/5      Flexibility   Soft Tissue Assessment /Muscle Length  yes      Palpation   Palpation comment  tenderness to palpation with visible/audible wincing secondary to pain with palpation of the tarsel tunnel around teh tibial nerve      Special Tests    Special Tests  Foot Alignment;Ankle/Foot Special Tests    Ankle/Foot Special Tests   Thompson's Test;Provocative Tinel's Test;Tinel's Test - post tibialis      Thompson's Test   Findings  Negative    Side  Left      Tinel's test - Post Tibialis    Findings  Positive    Side  Left    Comments  along tibial nerve tract      Provocative Tinel's test    Findings  Positive    Side  Left    Comments  along tibial nerve tract; may be related to flexor hallicus longus as well      Ambulation/Gait   Ambulation Distance (Feet)  638 Feet    Ambulation Surface  Level    Gait velocity  1.06 m/s    Stairs  Yes    Stairs Assistance  7: Independent    Stair Management Technique  One rail Right;Alternating pattern;Forwards    Number of Stairs  8    Height of Stairs  6    Gait Comments  patient wearing heel cups       Objective measurements completed on examination: See above findings.    PT Education - 02/04/17 0925    Education provided  Yes    Education Details  Exam findings and benefit of therapy. Discussed POC and likely interventions as well as encouraged patient to continue icing for 10-20 minutes at a time, with protective barrier for skin if  using a gel pack. Educated patient on signs and symptoms of CHF due to family history and resent onset of BLE swelling. She reports her PCP is aware of the swelling and instructed her to monitor it. I educated her to monitor her weight and swelling and if she notices an increase of 6-10 pounds in one day with and increase in swelling or SOB to contact her PCP and make them aware of this.     Person(s) Educated  Patient    Methods  Explanation    Comprehension  Verbalized understanding       PT Short Term Goals - 02/04/17 0931      PT SHORT TERM GOAL #1   Title  Patient will be independent with HEP and  be abel to demonstrate appropriate form/technique to be able to manage independently when appropriate for discharge.    Time  2    Period  Weeks    Status  New    Target Date  02/18/17      PT SHORT TERM GOAL #2   Title  Patient will improve MMT for limited muscle groups by 1/2 grade or more to demonstrate improved functional strength to support lower chain and decrease pain.    Time  3    Period  Weeks    Status  New    Target Date  02/25/17      PT SHORT TERM GOAL #3   Title  Patient will report being able to perform 10 minutes of walking with her dog without an onset/increase in pain to improve mobiliyt and QOL.    Time  3    Period  Weeks    Status  New        PT Long Term Goals - 02/04/17 0935      PT LONG TERM GOAL #1   Title  Patient will improve MMT for limited muscle groups by 1 grade or more to demonstrate improved functional strength to support lower chain and decrease pain.    Time  6    Period  Weeks    Status  New    Target Date  03/18/17      PT LONG TERM GOAL #2   Title  Patient will report being able to perform 15 minutes of upright weight bearing activity such as: cooking, cleaning, walking her dog without an onset/increase in pain to improve mobiliyt and QOL.    Time  6    Period  Weeks    Status  New      PT LONG TERM GOAL #3   Title  Patient will have  no reports of sharp/shooting pain radiating into plantar surface of foot with weight bearing activities with a maximum pain of 2/10 to demonstrate decreased tibial nerve irritation and improved activity tolerance to weight bearing activities.    Time  6    Period  Weeks    Status  New        Plan - 02/04/17 0939    Clinical Impression Statement  Ms. Laramee presents to physical therapy for initial evaluation of hell/foot pain. She presents with proximal hip muscle weakness, hypomobility of ankle joint, postural/alignment impairments of foot/ankle, and pain. She has negative findings with Maisie Fus test and resisted isometrics to lower extremity muscles. She describes pain radiating into the plantar surface of her foot along the tibial nerve innervation and has a positive tinnels test. Ms. Wanless is presenting with signs and symptoms of possible tibial nerve irritation along the tarsal tunnel and will benefit from skilled PT services to address current impairments, achieve goals, and improve QOL.    Clinical Presentation  Stable    Clinical Presentation due to:  FOTO, 3MWT< MMT, tinnel's test, clinical judgement    Clinical Decision Making  Low    Rehab Potential  Good    Clinical Impairments Affecting Rehab Potential  (+) motivated, (+) supportive spouse, (+) positive prior experience with therapy    PT Frequency  3x / week    PT Duration  6 weeks    PT Treatment/Interventions  ADLs/Self Care Home Management;Cryotherapy;Moist Heat;Gait training;Stair training;Functional mobility training;Therapeutic activities;Therapeutic exercise;Balance training;Neuromuscular re-education;Patient/family education;Manual techniques;Passive range of motion;Taping    PT Next Visit Plan  Review eval and goals. Initiate  HEP: see below. Test resisted flexor hallicus of left foot. Initiate heel raises, bridges, clamshell, side step with theraband, gastroc/soleus stretch. Initiate tibial nerve glides and joint mobilization  for DF of left ankle.     PT Home Exercise Plan  Initiate next session: towel curl with toes, ankle inversion/eversion with towel, gastroc/soleus stretch    Consulted and Agree with Plan of Care  Patient       Patient will benefit from skilled therapeutic intervention in order to improve the following deficits and impairments:  Decreased activity tolerance, Decreased balance, Abnormal gait, Decreased range of motion, Decreased strength, Hypomobility, Impaired sensation, Pain, Difficulty walking, Increased edema, Postural dysfunction  Visit Diagnosis: Pain in left foot  Stiffness of left foot, not elsewhere classified  Other symptoms and signs involving the musculoskeletal system     Problem List Patient Active Problem List   Diagnosis Date Noted  . Hx of colonic polyps 10/30/2010  . GERD (gastroesophageal reflux disease) 10/30/2010  . Constipation 10/30/2010    Valentino Saxon, PT, DPT Physical Therapist with [facility]  02/04/2017 10:07 AM    [facility] [address] [place], [place], [address] Phone: [phone]   Fax:  [phone]  Name: SAPIR LAVEY MRN: [id number] Date of Birth: Dec 19, 1945

## 2017-02-10 ENCOUNTER — Encounter (HOSPITAL_COMMUNITY): Payer: Self-pay

## 2017-02-10 ENCOUNTER — Ambulatory Visit (HOSPITAL_COMMUNITY): Payer: Medicare Other

## 2017-02-10 ENCOUNTER — Other Ambulatory Visit: Payer: Self-pay

## 2017-02-10 DIAGNOSIS — M79672 Pain in left foot: Secondary | ICD-10-CM

## 2017-02-10 DIAGNOSIS — M25675 Stiffness of left foot, not elsewhere classified: Secondary | ICD-10-CM

## 2017-02-10 DIAGNOSIS — R29898 Other symptoms and signs involving the musculoskeletal system: Secondary | ICD-10-CM

## 2017-02-10 NOTE — Patient Instructions (Signed)
   Towel Crunch: 2 sets of 10 reptitions  Place a towel on the floor. Crunch your toes around the towel and attempt to pick it up with your toes.    TOWEL SLIDES - INVERSION: 2x 1 minute  While seated, use a towel and slide it with your foot across the floor in an inward direction.    Be sure to keep your heel in contact with the floor the entire time.    TOWEL SLIDES - EVERSION: 2x 1 minute  While seated, use a towel and slide it with your foot across the floor in an outward direction.    Be sure to keep your heel in contact with the floor the entire time.     STANDING CALF STRETCH  - GASTROCNEMIUS: 1-2 time 30 seconds  Start by standing in front of a wall or other sturdy object. Step forward with one foot and maintain your toes on both feet to be pointed straight forward. Keep the leg behind you with a straight knee during the stretch.   Lean forward towards the wall and support yourself with your arms as you allow your front knee to bend until a gentle stretch is felt along the back of your leg that is most behind you.   Move closer or further away from the wall to control the stretch of the back leg. Also you can adjust the bend of the front knee to control the stretch as well.     STANDING CALF STRETCH  - SOLEUS: 1-2 time 30 seconds  Start by standing in front of a wall or other sturdy object. Step forward with one foot and maintain your toes on both feet to be pointed straight forward. Keep the leg behind you with a bent knee during the stretch.   Lean forward towards the wall and support yourself with your arms as you allow your front knee to bend until a gentle stretch is felt along the back of your leg that is most behind you.   Move closer or further away from the wall to control the stretch of the back leg. Also you can adjust the bend of the front knee to control the stretch as well.

## 2017-02-10 NOTE — Therapy (Signed)
[facility] [address] [place], [place], [address] Phone: [phone]   Fax:  [phone]  Physical Therapy Treatment  Patient Details  Name: Barbara [name] MRN: [id number] Date of Birth: 05-Jan-1946 Referring Provider: Toni Arthurs   Encounter Date: 02/10/2017  PT End of Session - 02/10/17 0859    Visit Number  2    Number of Visits  19    Date for PT Re-Evaluation  02/25/17    Authorization Type  [employer] Medicare    Authorization Time Period  02/04/17 - 03/18/17    PT Start Time  0901    PT Stop Time  0947    PT Time Calculation (min)  46 min    Activity Tolerance  Patient tolerated treatment well    Behavior During Therapy  [facility] for tasks assessed/performed       Past Medical History:  Diagnosis Date  . Anxiety   . Asthma   . Bladder spasms   . DDD (degenerative disc disease)   . GERD (gastroesophageal reflux disease)   . Hypercholesteremia   . Sciatic nerve disease   . Sleep apnea     Past Surgical History:  Procedure Laterality Date  . ABDOMINAL HYSTERECTOMY    . BREAST BIOPSY     "precancer"-left  . CATARACT EXTRACTION W/PHACO Left 08/04/2016   Procedure: CATARACT EXTRACTION PHACO AND INTRAOCULAR LENS PLACEMENT (IOC);  Surgeon: Gemma Payor, MD;  Location: [facility];  Service: Ophthalmology;  Laterality: Left;  CDE: 5.48  . CATARACT EXTRACTION W/PHACO Right 08/18/2016   Procedure: CATARACT EXTRACTION PHACO AND INTRAOCULAR LENS PLACEMENT RIGHT EYE;  Surgeon: Gemma Payor, MD;  Location: [facility];  Service: Ophthalmology;  Laterality: Right;  CDE: 8.30   . COLONOSCOPY     12 years ago, Dr. Jena Gauss, polyp  . complete hysterectomy  2000   fibroids  . TONSILLECTOMY    . TUBAL LIGATION      There were no vitals filed for this visit.  Subjective Assessment - 02/10/17 0907    Subjective  Patient reports he doctor instructed her to drink more water to help with swelling in her legs. She reprots she only has pain while  weight bearing.     Limitations  Walking;Standing;House hold activities    How long can you sit comfortably?  unlimited    How long can you stand comfortably?  30 minutes at most    How long can you walk comfortably?  5 minutes, 200 yards    Patient Stated Goals  waling further with no pain, at least 10 minues pain free    Currently in Pain?  No/denies        [facility] Adult PT Treatment/Exercise - 02/10/17 0001      Exercises   Exercises  Ankle      Modalities   Modalities  Cryotherapy      Cryotherapy   Number Minutes Cryotherapy  4 Minutes    Cryotherapy Location  Ankle foot, plantar surface    Type of Cryotherapy  Ice massage      Manual Therapy   Manual Therapy  Joint mobilization;Neural Stretch    Manual therapy comments  performed seperate from all other interventions    Joint Mobilization  AP grade III to TCJ for dorsiflexion on left, 3x 45 seconds, Grade III medial/lateral glide for inversion eversion to subtalar joint    Neural Stretch  1x 10 reps for tibila nerve stretch      Ankle Exercises:  Standing   Heel Raises  15 reps;Limitations    Heel Raises Limitations  2 sets: 1st with ball for posterior tib facilitation, 2nd set with towel under great toe for windlass maneuver      Ankle Exercises: Seated   Towel Crunch  2 reps;Limitations    Towel Crunch Limitations  2 sets 15 reps     Towel Inversion/Eversion  Limitations    Towel Inversion/Eversion Limitations  2x 1 minute each direction    Other Seated Ankle Exercises  Rockerboard: DF/PF 10 reps both directions; inversion/eversion 10 reps each       PT Education - 02/10/17 1215    Education provided  Yes    Education Details  Educated on exercise form/technique throughout sesion. Educated to pay attention to pain in left foot following tibial nerve glides. Educated on frequency and repetition for HEP.    Person(s) Educated  Patient    Methods  Explanation;Demonstration;Handout    Comprehension  Verbalized  understanding;Returned demonstration       PT Short Term Goals - 02/04/17 0931      PT SHORT TERM GOAL #1   Title  Patient will be independent with HEP and be abel to demonstrate appropriate form/technique to be able to manage independently when appropriate for discharge.    Time  2    Period  Weeks    Status  New    Target Date  02/18/17      PT SHORT TERM GOAL #2   Title  Patient will improve MMT for limited muscle groups by 1/2 grade or more to demonstrate improved functional strength to support lower chain and decrease pain.    Time  3    Period  Weeks    Status  New    Target Date  02/25/17      PT SHORT TERM GOAL #3   Title  Patient will report being able to perform 10 minutes of walking with her dog without an onset/increase in pain to improve mobiliyt and QOL.    Time  3    Period  Weeks    Status  New        PT Long Term Goals - 02/04/17 0935      PT LONG TERM GOAL #1   Title  Patient will improve MMT for limited muscle groups by 1 grade or more to demonstrate improved functional strength to support lower chain and decrease pain.    Time  6    Period  Weeks    Status  New    Target Date  03/18/17      PT LONG TERM GOAL #2   Title  Patient will report being able to perform 15 minutes of upright weight bearing activity such as: cooking, cleaning, walking her dog without an onset/increase in pain to improve mobiliyt and QOL.    Time  6    Period  Weeks    Status  New      PT LONG TERM GOAL #3   Title  Patient will have no reports of sharp/shooting pain radiating into plantar surface of foot with weight bearing activities with a maximum pain of 2/10 to demonstrate decreased tibial nerve irritation and improved activity tolerance to weight bearing activities.    Time  6    Period  Weeks    Status  New        Plan - 02/10/17 1218    Clinical Impression Statement  Patient initiated treatment today for left  foot/heel pain. She is limited in weight bearing by  medial/plantar heel pain and performed primarily seated exercises. She tolerated bilateral heel raises today and will be able to advance exercises as pain improves. Nest session she will benefit from proximal hip strengthening. She will benefit from continued skilled PT services to address current impairments to decrease pain and improve functional mobility.    Rehab Potential  Good    Clinical Impairments Affecting Rehab Potential  (+) motivated, (+) supportive spouse, (+) positive prior experience with therapy    PT Frequency  3x / week    PT Duration  6 weeks    PT Treatment/Interventions  ADLs/Self Care Home Management;Cryotherapy;Moist Heat;Gait training;Stair training;Functional mobility training;Therapeutic activities;Therapeutic exercise;Balance training;Neuromuscular re-education;Patient/family education;Manual techniques;Passive range of motion;Taping    PT Next Visit Plan  Continue heel raises, bridges, clamshell, side step with theraband, gastroc/soleus stretch. Initiate tibial nerve glides and joint mobilization for DF of left ankle. Initiate BAPS training and proximal hip flexor strengthening. Side step, bridge, clamshell, step down/up.    PT Home Exercise Plan  02/10/17 - towel curl with toes, ankle inversion/eversion with towel, gastroc/soleus stretch;     Consulted and Agree with Plan of Care  Patient       Patient will benefit from skilled therapeutic intervention in order to improve the following deficits and impairments:  Decreased activity tolerance, Decreased balance, Abnormal gait, Decreased range of motion, Decreased strength, Hypomobility, Impaired sensation, Pain, Difficulty walking, Increased edema, Postural dysfunction  Visit Diagnosis: Pain in left foot  Stiffness of left foot, not elsewhere classified  Other symptoms and signs involving the musculoskeletal system     Problem List Patient Active Problem List   Diagnosis Date Noted  . Hx of colonic polyps  10/30/2010  . GERD (gastroesophageal reflux disease) 10/30/2010  . Constipation 10/30/2010    Valentino Saxonachel Quinn-Brown, PT, DPT Physical Therapist with [facility]  02/10/2017 12:25 PM    [facility] [address] , [place], [address] Phone: [phone]   Fax:  [phone]  Name: Barbara [name] MRN: [id number] Date of Birth: [birth year]

## 2017-02-13 ENCOUNTER — Other Ambulatory Visit: Payer: Self-pay

## 2017-02-13 ENCOUNTER — Ambulatory Visit (HOSPITAL_COMMUNITY): Payer: Medicare Other | Admitting: Physical Therapy

## 2017-02-13 ENCOUNTER — Encounter (HOSPITAL_COMMUNITY): Payer: Self-pay | Admitting: Physical Therapy

## 2017-02-13 DIAGNOSIS — R29898 Other symptoms and signs involving the musculoskeletal system: Secondary | ICD-10-CM

## 2017-02-13 DIAGNOSIS — M25675 Stiffness of left foot, not elsewhere classified: Secondary | ICD-10-CM

## 2017-02-13 DIAGNOSIS — M79672 Pain in left foot: Secondary | ICD-10-CM | POA: Diagnosis not present

## 2017-02-13 NOTE — Therapy (Signed)
[facility] [address] [place] [place], [place], [address] Phone: [phone]   Fax:  [phone]  Physical Therapy Treatment  Patient Details  Name: Barbara [name] MRN: [id number] Date of Birth: 01-Mar-1945 Referring Provider: Toni ArthursHewitt, John   Encounter Date: 02/13/2017  PT End of Session - 02/13/17 1258    Visit Number  3    Number of Visits  19    Date for PT Re-Evaluation  02/25/17    Authorization Type  [employer] Medicare    Authorization Time Period  02/04/17 - 03/18/17    PT Start Time  0830 Patient arrived late to therapy today    PT Stop Time  0901    PT Time Calculation (min)  31 min    Activity Tolerance  Patient tolerated treatment well    Behavior During Therapy  [facility] for tasks assessed/performed       Past Medical History:  Diagnosis Date  . Anxiety   . Asthma   . Bladder spasms   . DDD (degenerative disc disease)   . GERD (gastroesophageal reflux disease)   . Hypercholesteremia   . Sciatic nerve disease   . Sleep apnea     Past Surgical History:  Procedure Laterality Date  . ABDOMINAL HYSTERECTOMY    . BREAST BIOPSY     "precancer"-left  . CATARACT EXTRACTION W/PHACO Left 08/04/2016   Procedure: CATARACT EXTRACTION PHACO AND INTRAOCULAR LENS PLACEMENT (IOC);  Surgeon: Gemma PayorHunt, Kerry, MD;  Location: [facility];  Service: Ophthalmology;  Laterality: Left;  CDE: 5.48  . CATARACT EXTRACTION W/PHACO Right 08/18/2016   Procedure: CATARACT EXTRACTION PHACO AND INTRAOCULAR LENS PLACEMENT RIGHT EYE;  Surgeon: Gemma PayorHunt, Kerry, MD;  Location: [facility];  Service: Ophthalmology;  Laterality: Right;  CDE: 8.30   . COLONOSCOPY     12 years ago, Dr. Jena Gaussourk, polyp  . complete hysterectomy  2000   fibroids  . TONSILLECTOMY    . TUBAL LIGATION      There were no vitals filed for this visit.  Subjective Assessment - 02/13/17 0845    Subjective  Patient reported again that her doctor instructed her to drink more water to help with swelling  in her legs. She reprots she only has pain while weight bearing and that her pain is a 5/10 this session.    Limitations  Walking;Standing;House hold activities    How long can you sit comfortably?  unlimited    How long can you stand comfortably?  30 minutes at most    How long can you walk comfortably?  5 minutes, 200 yards    Patient Stated Goals  waling further with no pain, at least 10 minues pain free    Currently in Pain?  Yes    Pain Score  5     Pain Location  Ankle    Pain Orientation  Left    Pain Descriptors / Indicators  Aching    Pain Onset  More than a month ago                      [facility] Adult [facility] Treatment/Exercise - 02/13/17 0001      Manual Therapy   Manual Therapy  Joint mobilization;Neural Stretch    Manual therapy comments  performed seperate from all other interventions    Joint Mobilization  AP grade III to TCJ for dorsiflexion on left, 3x 45 seconds    Neural Stretch  1x 10 reps for tibial nerve stretch  Ankle Exercises: Standing   Heel Raises  15 reps;Limitations    Heel Raises Limitations  2 sets: 1st with ball for posterior tib facilitation, 2nd set with towel under great toe for windlass maneuver    Other Standing Ankle Exercises  Sidestepping with red theraband 10 feet x 2 each direction Strengthening hips to assist ankle VCs toes forward    Other Standing Ankle Exercises  Hip flexion march with single upper extremity support 2 x 10 each lower extremity       Ankle Exercises: Seated   Towel Crunch  2 reps;Limitations    Towel Inversion/Eversion  Limitations    Towel Inversion/Eversion Limitations  2x 1 minute each direction    Other Seated Ankle Exercises  Rockerboard: DF/PF 10 reps both directions; inversion/eversion 10 reps each      Additional Ankle Exercises DO NOT USE   Towel Crunch Limitations  2 sets 15 reps       Ankle Exercises: Supine   Other Supine Ankle Exercises  Bridges with both legs x 10             PT  Education - 02/13/17 1257    Education provided  Yes    Education Details  Educated patient on exercise form/technique throughout session educated patient on purpose of exercises throughout session.     Person(s) Educated  Patient    Methods  Explanation    Comprehension  Verbalized understanding;Returned demonstration       PT Short Term Goals - 02/04/17 0931      PT SHORT TERM GOAL #1   Title  Patient will be independent with HEP and be abel to demonstrate appropriate form/technique to be able to manage independently when appropriate for discharge.    Time  2    Period  Weeks    Status  New    Target Date  02/18/17      PT SHORT TERM GOAL #2   Title  Patient will improve MMT for limited muscle groups by 1/2 grade or more to demonstrate improved functional strength to support lower chain and decrease pain.    Time  3    Period  Weeks    Status  New    Target Date  02/25/17      PT SHORT TERM GOAL #3   Title  Patient will report being able to perform 10 minutes of walking with her dog without an onset/increase in pain to improve mobiliyt and QOL.    Time  3    Period  Weeks    Status  New        PT Long Term Goals - 02/04/17 0935      PT LONG TERM GOAL #1   Title  Patient will improve MMT for limited muscle groups by 1 grade or more to demonstrate improved functional strength to support lower chain and decrease pain.    Time  6    Period  Weeks    Status  New    Target Date  03/18/17      PT LONG TERM GOAL #2   Title  Patient will report being able to perform 15 minutes of upright weight bearing activity such as: cooking, cleaning, walking her dog without an onset/increase in pain to improve mobiliyt and QOL.    Time  6    Period  Weeks    Status  New      PT LONG TERM GOAL #3   Title  Patient will  have no reports of sharp/shooting pain radiating into plantar surface of foot with weight bearing activities with a maximum pain of 2/10 to demonstrate decreased tibial  nerve irritation and improved activity tolerance to weight bearing activities.    Time  6    Period  Weeks    Status  New            Plan - 02/13/17 1312    Clinical Impression Statement  This session initially focused on ankle mobility. Therapist performed posterior glides to talocrural joint as listed above in order to assist with increasing dorsiflexion. Patient also performed supine stretch of tibial nerve.  Patient performed supine bridges to strengthen glutes and help improve mechanics at the ankle. Then patient performed seated ankle exercises for ankle strengthening and mobility.  Finally patient progressed to performing standing strengthening for the lower extremities and to improve functional mobility. Patient required cueing for proper technique on sidestepping in order to keep toes facing forward. Patient would continue to benefit from skilled physical therapy in order to address patient's current deficits in strength, mobility and overall functional mobility.     Rehab Potential  Good    Clinical Impairments Affecting Rehab Potential  (+) motivated, (+) supportive spouse, (+) positive prior experience with therapy    PT Frequency  3x / week    PT Duration  6 weeks    PT Treatment/Interventions  ADLs/Self Care Home Management;Cryotherapy;Moist Heat;Gait training;Stair training;Functional mobility training;Therapeutic activities;Therapeutic exercise;Balance training;Neuromuscular re-education;Patient/family education;Manual techniques;Passive range of motion;Taping    PT Next Visit Plan  Continue heel raises, bridges, clamshell, side step with theraband, gastroc/soleus stretch. Continue tibial nerve stretch and move to glides and joint mobilization for DF of left ankle. Initiate BAPS training and continue proximal hip flexor strengthening. Side step, bridge, clamshell, step down/up.    PT Home Exercise Plan  02/10/17 - towel curl with toes, ankle inversion/eversion with towel,  gastroc/soleus stretch;     Consulted and Agree with Plan of Care  Patient       Patient will benefit from skilled therapeutic intervention in order to improve the following deficits and impairments:  Decreased activity tolerance, Decreased balance, Abnormal gait, Decreased range of motion, Decreased strength, Hypomobility, Impaired sensation, Pain, Difficulty walking, Increased edema, Postural dysfunction  Visit Diagnosis: Pain in left foot  Stiffness of left foot, not elsewhere classified  Other symptoms and signs involving the musculoskeletal system     Problem List Patient Active Problem List   Diagnosis Date Noted  . Hx of colonic polyps 10/30/2010  . GERD (gastroesophageal reflux disease) 10/30/2010  . Constipation 10/30/2010    Verne Carrow PT, DPT 5:03 PM, 02/13/17 [phone]  [facility] [address] [place], [place], [address] Phone: [phone]   Fax:  [phone]  Name: Barbara [name] MRN: [id number] Date of Birth: 1945/10/27

## 2017-02-17 ENCOUNTER — Ambulatory Visit (HOSPITAL_COMMUNITY): Payer: Medicare Other

## 2017-02-17 ENCOUNTER — Encounter (HOSPITAL_COMMUNITY): Payer: Self-pay

## 2017-02-17 ENCOUNTER — Other Ambulatory Visit: Payer: Self-pay

## 2017-02-17 DIAGNOSIS — M25675 Stiffness of left foot, not elsewhere classified: Secondary | ICD-10-CM

## 2017-02-17 DIAGNOSIS — M79672 Pain in left foot: Secondary | ICD-10-CM

## 2017-02-17 DIAGNOSIS — R29898 Other symptoms and signs involving the musculoskeletal system: Secondary | ICD-10-CM

## 2017-02-17 NOTE — Patient Instructions (Signed)
   ELASTIC BAND LATERAL WALKS: 1-2 sets of 10-15 steps each direction  **Toes forward and pick your feet up**  With an elastic band around your thighs, take steps to the side while keeping your feet spread apart. Keep your knees bent the entire time.

## 2017-02-17 NOTE — Therapy (Signed)
[facility] [address] [place], [place], [address] Phone: [phone]   Fax:  [phone]  Physical Therapy Treatment  Patient Details  Name: Barbara [name] MRN: [id number] Date of Birth: [birth year] Referring Provider: Toni Arthurs   Encounter Date: 02/17/2017  PT End of Session - 02/17/17 0820    Visit Number  4    Number of Visits  19    Date for PT Re-Evaluation  02/25/17    Authorization Type  [employer] Medicare    Authorization Time Period  02/04/17 - 03/18/17    PT Start Time  0818    PT Stop Time  0900    PT Time Calculation (min)  42 min    Activity Tolerance  Patient tolerated treatment well    Behavior During Therapy  [facility] for tasks assessed/performed       Past Medical History:  Diagnosis Date  . Anxiety   . Asthma   . Bladder spasms   . DDD (degenerative disc disease)   . GERD (gastroesophageal reflux disease)   . Hypercholesteremia   . Sciatic nerve disease   . Sleep apnea     Past Surgical History:  Procedure Laterality Date  . ABDOMINAL HYSTERECTOMY    . BREAST BIOPSY     "precancer"-left  . CATARACT EXTRACTION W/PHACO Left 08/04/2016   Procedure: CATARACT EXTRACTION PHACO AND INTRAOCULAR LENS PLACEMENT (IOC);  Surgeon: Gemma Payor, MD;  Location: [facility];  Service: Ophthalmology;  Laterality: Left;  CDE: 5.48  . CATARACT EXTRACTION W/PHACO Right 08/18/2016   Procedure: CATARACT EXTRACTION PHACO AND INTRAOCULAR LENS PLACEMENT RIGHT EYE;  Surgeon: Gemma Payor, MD;  Location: [facility];  Service: Ophthalmology;  Laterality: Right;  CDE: 8.30   . COLONOSCOPY     12 years ago, Dr. Jena Gauss, polyp  . complete hysterectomy  2000   fibroids  . TONSILLECTOMY    . TUBAL LIGATION      There were no vitals filed for this visit.  Subjective Assessment - 02/17/17 0841    Subjective  Patient reports she had a good weekend overall. She states on Sunday she spent the entire day on her feet and she had a lot more pain  sunday night than she had been having. Today she reports only haing 2/10 pain. She reports her HEP is going well and she has been able to walk on her treadmill for 10 mins each day except Sunday.    Limitations  Walking;Standing;House hold activities    How long can you sit comfortably?  unlimited    How long can you stand comfortably?  30 minutes at most    How long can you walk comfortably?  5 minutes, 200 yards    Patient Stated Goals  waling further with no pain, at least 10 minues pain free    Currently in Pain?  Yes    Pain Score  2     Pain Location  Foot    Pain Orientation  Left    Pain Descriptors / Indicators  Aching;Tender    Pain Onset  More than a month ago    Pain Frequency  Constant    Multiple Pain Sites  No        [facility] Adult PT Treatment/Exercise - 02/17/17 0001      Manual Therapy   Manual Therapy  Joint mobilization;Neural Stretch    Manual therapy comments  performed seperate from all other interventions    Joint Mobilization  AP grade  III to TCJ for dorsiflexion on left, 3x 45 seconds    Neural Stretch  1x 10 reps for tibial nerve stretch      Ankle Exercises: Seated   Towel Crunch  2 reps;Limitations 1 minute each    Towel Inversion/Eversion Limitations  2x 1 minute each direction    BAPS  Level 2;Sitting;10 reps;Limitations    BAPS Limitations  10 reps counterclockwise and closckwise each    Other Seated Ankle Exercises  15 reps with red theraband: PF/inversion/eversion      Ankle Exercises: Standing   Heel Raises  20 reps;Limitations    Heel Raises Limitations  2 sets: 1st with ball for posterior tib facilitation, 2nd set with towel under great toe for windlass maneuver    Other Standing Ankle Exercises  Sidestepping with red theraband 15 feet x 3 each direction cues to lift feet and deter toing out      Ankle Exercises: Stretches   Gastroc Stretch  3 reps;30 seconds;Limitations    Gastroc Stretch Limitations  slant board        PT Education -  02/17/17 0840    Education provided  Yes    Education Details  Educated on exercise form throughout session. Updated HEP.    Person(s) Educated  Patient    Methods  Explanation;Handout    Comprehension  Verbalized understanding;Returned demonstration       PT Short Term Goals - 02/04/17 0931      PT SHORT TERM GOAL #1   Title  Patient will be independent with HEP and be abel to demonstrate appropriate form/technique to be able to manage independently when appropriate for discharge.    Time  2    Period  Weeks    Status  New    Target Date  02/18/17      PT SHORT TERM GOAL #2   Title  Patient will improve MMT for limited muscle groups by 1/2 grade or more to demonstrate improved functional strength to support lower chain and decrease pain.    Time  3    Period  Weeks    Status  New    Target Date  02/25/17      PT SHORT TERM GOAL #3   Title  Patient will report being able to perform 10 minutes of walking with her dog without an onset/increase in pain to improve mobiliyt and QOL.    Time  3    Period  Weeks    Status  New        PT Long Term Goals - 02/04/17 0935      PT LONG TERM GOAL #1   Title  Patient will improve MMT for limited muscle groups by 1 grade or more to demonstrate improved functional strength to support lower chain and decrease pain.    Time  6    Period  Weeks    Status  New    Target Date  03/18/17      PT LONG TERM GOAL #2   Title  Patient will report being able to perform 15 minutes of upright weight bearing activity such as: cooking, cleaning, walking her dog without an onset/increase in pain to improve mobiliyt and QOL.    Time  6    Period  Weeks    Status  New      PT LONG TERM GOAL #3   Title  Patient will have no reports of sharp/shooting pain radiating into plantar surface of foot with weight  bearing activities with a maximum pain of 2/10 to demonstrate decreased tibial nerve irritation and improved activity tolerance to weight bearing  activities.    Time  6    Period  Weeks    Status  New        Plan - 02/17/17 0846    Clinical Impression Statement  Patient is progressing well in therapy and has had a good response to tibial nerve glides each session. She was able to advance ankle/foot exercises with BAPS board for proprioceptive training on level 2. She also has progressed proximal hip strengthening. She remains limited in weight bearing by medial/plantar heel pain with extended activity but reports overall improved activity tolerance with being able to walk more frequently on her treadmill. She will benefit from continued skilled PT services to address current impairments to decrease pain and improve functional mobility.    Rehab Potential  Good    Clinical Impairments Affecting Rehab Potential  (+) motivated, (+) supportive spouse, (+) positive prior experience with therapy    PT Frequency  3x / week    PT Duration  6 weeks    PT Treatment/Interventions  ADLs/Self Care Home Management;Cryotherapy;Moist Heat;Gait training;Stair training;Functional mobility training;Therapeutic activities;Therapeutic exercise;Balance training;Neuromuscular re-education;Patient/family education;Manual techniques;Passive range of motion;Taping    PT Next Visit Plan  Continue heel raises, bridges, clamshell, side step with theraband, gastroc/soleus stretch. Continue tibial nerve stretch and move to glides and joint mobilization for DF of left ankle. Initiate BAPS training and continue proximal hip flexor strengthening. Side step, bridge, clamshell, step down/up.    PT Home Exercise Plan  02/10/17 - towel curl with toes, ankle inversion/eversion with towel, gastroc/soleus stretch; 02/17/17 - side step with theraband    Consulted and Agree with Plan of Care  Patient       Patient will benefit from skilled therapeutic intervention in order to improve the following deficits and impairments:  Decreased activity tolerance, Decreased balance, Abnormal  gait, Decreased range of motion, Decreased strength, Hypomobility, Impaired sensation, Pain, Difficulty walking, Increased edema, Postural dysfunction  Visit Diagnosis: Pain in left foot  Stiffness of left foot, not elsewhere classified  Other symptoms and signs involving the musculoskeletal system     Problem List Patient Active Problem List   Diagnosis Date Noted  . Hx of colonic polyps 10/30/2010  . GERD (gastroesophageal reflux disease) 10/30/2010  . Constipation 10/30/2010    Valentino Saxon, PT, DPT Physical Therapist with [facility]  02/17/2017 9:04 AM    [facility] [address] [place], [place], [address] Phone: [phone]   Fax:  [phone]  Name: DARCEY CARDY MRN: [id number] Date of Birth: February 24, 1945

## 2017-02-18 ENCOUNTER — Encounter (HOSPITAL_COMMUNITY): Payer: Medicare Other

## 2017-02-20 ENCOUNTER — Ambulatory Visit (HOSPITAL_COMMUNITY): Payer: Medicare Other | Admitting: Physical Therapy

## 2017-02-20 ENCOUNTER — Other Ambulatory Visit: Payer: Self-pay

## 2017-02-20 ENCOUNTER — Encounter (HOSPITAL_COMMUNITY): Payer: Self-pay | Admitting: Physical Therapy

## 2017-02-20 DIAGNOSIS — M79672 Pain in left foot: Secondary | ICD-10-CM | POA: Diagnosis not present

## 2017-02-20 DIAGNOSIS — M25675 Stiffness of left foot, not elsewhere classified: Secondary | ICD-10-CM

## 2017-02-20 DIAGNOSIS — R29898 Other symptoms and signs involving the musculoskeletal system: Secondary | ICD-10-CM

## 2017-02-20 NOTE — Therapy (Signed)
[facility] [address] [place], [place], [address] Phone: [phone]   Fax:  [phone]  Physical Therapy Treatment  Patient Details  Name: Barbara [name] MRN: [id number] Date of Birth: 1945/04/03 Referring Provider: Toni Arthurs   Encounter Date: 02/20/2017  PT End of Session - 02/20/17 0915    Visit Number  5    Number of Visits  19    Date for PT Re-Evaluation  02/25/17    Authorization Type  [employer] Medicare    Authorization Time Period  02/04/17 - 03/18/17    PT Start Time  0819    PT Stop Time  0902    PT Time Calculation (min)  43 min    Equipment Utilized During Treatment  Gait belt Gait belt during balance exercise    Activity Tolerance  Patient tolerated treatment well    Behavior During Therapy  [facility] for tasks assessed/performed       Past Medical History:  Diagnosis Date  . Anxiety   . Asthma   . Bladder spasms   . DDD (degenerative disc disease)   . GERD (gastroesophageal reflux disease)   . Hypercholesteremia   . Sciatic nerve disease   . Sleep apnea     Past Surgical History:  Procedure Laterality Date  . ABDOMINAL HYSTERECTOMY    . BREAST BIOPSY     "precancer"-left  . CATARACT EXTRACTION W/PHACO Left 08/04/2016   Procedure: CATARACT EXTRACTION PHACO AND INTRAOCULAR LENS PLACEMENT (IOC);  Surgeon: Gemma Payor, MD;  Location: [facility];  Service: Ophthalmology;  Laterality: Left;  CDE: 5.48  . CATARACT EXTRACTION W/PHACO Right 08/18/2016   Procedure: CATARACT EXTRACTION PHACO AND INTRAOCULAR LENS PLACEMENT RIGHT EYE;  Surgeon: Gemma Payor, MD;  Location: [facility];  Service: Ophthalmology;  Laterality: Right;  CDE: 8.30   . COLONOSCOPY     12 years ago, Dr. Jena Gauss, polyp  . complete hysterectomy  2000   fibroids  . TONSILLECTOMY    . TUBAL LIGATION      There were no vitals filed for this visit.  Subjective Assessment - 02/20/17 0836    Subjective  Patient reports she had a good weekend overall.  She states on SUnday she spent the entire day on her feet and she had a lot more pain sunday night than she had been having. Today she reports only haing 2/10 pain. She reports her HEP is going well and she has been able to walk on her treadmill for 10 mins each day except Sunday.    Limitations  Walking;Standing;House hold activities    How long can you sit comfortably?  unlimited    How long can you stand comfortably?  30 minutes at most     How long can you walk comfortably?  10 minutes currently (was 5 minutes, 200 yards)    Patient Stated Goals  waling further with no pain, at least 10 minues pain free    Currently in Pain?  No/denies    Multiple Pain Sites  No                      [facility] Adult PT Treatment/Exercise - 02/20/17 0001      Manual Therapy   Manual Therapy  Joint mobilization;Neural Stretch    Manual therapy comments  performed separately from all other skilled interventions    Joint Mobilization  AP grade III glide to talocrural joint to improve dorsiflexion on the left foot; 5 x  45 seconds    Neural Stretch  1x10 reps tibial nerve glide with therapist assist       Ankle Exercises: Seated   Towel Crunch  2 reps;Limitations 2 x 1 minute    Towel Inversion/Eversion  Limitations    Towel Inversion/Eversion Limitations  2x1 minute each     BAPS  Sitting;Level 2;10 reps;Limitations    BAPS Limitations  10 x counterclockwise; 10 x  clockwise    Other Seated Ankle Exercises  15 reps with red theraband: plantarflexion, inversion, eversion       Ankle Exercises: Standing   Heel Raises  20 reps;Limitations    Heel Raises Limitations  2 sets: 1st with small ball for posterior tibialis facilitation, second set with towel under great toe for windlass maneuver    Other Standing Ankle Exercises  Sidestepping with red theraband 15 feet x 2 each direction Strengthening hips to assist ankle VCs toes forward    Other Standing Ankle Exercises  Tandem stance on compliant foam  for ankle stability strengthening 3x each lower extremity      Ankle Exercises: Stretches   Gastroc Stretch  3 reps;30 seconds;Limitations    Gastroc Stretch Limitations  slant board      Ankle Exercises: Supine   Other Supine Ankle Exercises  Bridges with both legs x 10             PT Education - 02/20/17 0837    Education provided  Yes    Education Details  Patient was educated on purpose and technique of interventions throughout.    Person(s) Educated  Patient    Methods  Explanation;Handout    Comprehension  Verbalized understanding;Returned demonstration       PT Short Term Goals - 02/04/17 0931      PT SHORT TERM GOAL #1   Title  Patient will be independent with HEP and be abel to demonstrate appropriate form/technique to be able to manage independently when appropriate for discharge.    Time  2    Period  Weeks    Status  New    Target Date  02/18/17      PT SHORT TERM GOAL #2   Title  Patient will improve MMT for limited muscle groups by 1/2 grade or more to demonstrate improved functional strength to support lower chain and decrease pain.    Time  3    Period  Weeks    Status  New    Target Date  02/25/17      PT SHORT TERM GOAL #3   Title  Patient will report being able to perform 10 minutes of walking with her dog without an onset/increase in pain to improve mobiliyt and QOL.    Time  3    Period  Weeks    Status  New        PT Long Term Goals - 02/04/17 0935      PT LONG TERM GOAL #1   Title  Patient will improve MMT for limited muscle groups by 1 grade or more to demonstrate improved functional strength to support lower chain and decrease pain.    Time  6    Period  Weeks    Status  New    Target Date  03/18/17      PT LONG TERM GOAL #2   Title  Patient will report being able to perform 15 minutes of upright weight bearing activity such as: cooking, cleaning, walking her dog without an onset/increase  in pain to improve mobiliyt and QOL.     Time  6    Period  Weeks    Status  New      PT LONG TERM GOAL #3   Title  Patient will have no reports of sharp/shooting pain radiating into plantar surface of foot with weight bearing activities with a maximum pain of 2/10 to demonstrate decreased tibial nerve irritation and improved activity tolerance to weight bearing activities.    Time  6    Period  Weeks    Status  New            Plan - 02/20/17 0916    Clinical Impression Statement  Patient is continuing to progress well with therapy. Patient reported no pain today. Continued manual therapy with joint mobilization to left ankle and with nerve glides to tibial nerve. Continued progression of ankle strengthening and mobility exercises as well as hip strengthening and mobility exercises. This session incorporated tandem balance on foam surface to challenge patient's ankle muscle stabilization. Patient tolerated well. Patient continued to require cueing to keep toes facing forward with sidestepping exercises this session. Patient also demonstrated some difficulty wth performing the seated BAPS exercises today. Patient would benefit from continued skilled physical therapy in order to continue addressing patient's deficits in ankle mobility, strength, hip strength, and overall functional mobility.     Rehab Potential  Good    Clinical Impairments Affecting Rehab Potential  (+) motivated, (+) supportive spouse, (+) positive prior experience with therapy    PT Frequency  3x / week    PT Duration  6 weeks    PT Treatment/Interventions  ADLs/Self Care Home Management;Cryotherapy;Moist Heat;Gait training;Stair training;Functional mobility training;Therapeutic activities;Therapeutic exercise;Balance training;Neuromuscular re-education;Patient/family education;Manual techniques;Passive range of motion;Taping    PT Next Visit Plan  Continue heel raises, bridges, clamshell, side step with theraband, gastroc/soleus stretch. Continue tibial nerve  stretch and move to glides and joint mobilization for DF of left ankle. Continue BAPS training and continue proximal hip flexor strengthening. Side step, bridge, clamshell, consider adding step down/up.    PT Home Exercise Plan  02/10/17 - towel curl with toes, ankle inversion/eversion with towel, gastroc/soleus stretch; 02/17/17 - side step with theraband    Consulted and Agree with Plan of Care  Patient       Patient will benefit from skilled therapeutic intervention in order to improve the following deficits and impairments:  Decreased activity tolerance, Decreased balance, Abnormal gait, Decreased range of motion, Decreased strength, Hypomobility, Impaired sensation, Pain, Difficulty walking, Increased edema, Postural dysfunction  Visit Diagnosis: Pain in left foot  Stiffness of left foot, not elsewhere classified  Other symptoms and signs involving the musculoskeletal system     Problem List Patient Active Problem List   Diagnosis Date Noted  . Hx of colonic polyps 10/30/2010  . GERD (gastroesophageal reflux disease) 10/30/2010  . Constipation 10/30/2010    Verne CarrowMacy Jorma Tassinari PT, DPT 9:26 AM, 02/20/17 [phone]  [facility] [address] [place], [place], [address] Phone: [phone]   Fax:  [phone]  Name: Barbara [name] MRN: [id number] Date of Birth: 03/22/1945

## 2017-02-24 ENCOUNTER — Encounter (HOSPITAL_COMMUNITY): Payer: Medicare Other

## 2017-02-25 ENCOUNTER — Ambulatory Visit (HOSPITAL_COMMUNITY): Payer: Medicare Other

## 2017-02-26 ENCOUNTER — Ambulatory Visit (HOSPITAL_COMMUNITY): Payer: Medicare Other

## 2017-02-26 ENCOUNTER — Other Ambulatory Visit: Payer: Self-pay

## 2017-02-26 DIAGNOSIS — M25675 Stiffness of left foot, not elsewhere classified: Secondary | ICD-10-CM

## 2017-02-26 DIAGNOSIS — R29898 Other symptoms and signs involving the musculoskeletal system: Secondary | ICD-10-CM

## 2017-02-26 DIAGNOSIS — M79672 Pain in left foot: Secondary | ICD-10-CM | POA: Diagnosis not present

## 2017-02-26 NOTE — Therapy (Signed)
[place], [place], [address] Phone: [phone]   Fax:  [phone]  Physical Therapy Treatment/Re-Assessment  Patient Details  Name: ABIGAIL TEALL MRN: [id number] Date of Birth: 1945/07/17 Referring Provider: Wylene Simmer   Encounter Date: 02/26/2017  PT End of Session - 02/26/17 0820    Visit Number  6    Number of Visits  19    Date for PT Re-Evaluation  03/18/17    Authorization Type  [employer]    Authorization Time Period  02/04/17 - 03/18/17    PT Start Time  0820    PT Stop Time  0900    PT Time Calculation (min)  40 min    Equipment Utilized During Treatment  Gait belt Gait belt during balance exercise    Activity Tolerance  Patient tolerated treatment well    Behavior During Therapy  WFL for tasks assessed/performed       Past Medical History:  Diagnosis Date  . Anxiety   . Asthma   . Bladder spasms   . DDD (degenerative disc disease)   . GERD (gastroesophageal reflux disease)   . Hypercholesteremia   . Sciatic nerve disease   . Sleep apnea     Past Surgical History:  Procedure Laterality Date  . ABDOMINAL HYSTERECTOMY    . BREAST BIOPSY     "precancer"-left  . CATARACT EXTRACTION W/PHACO Left 08/04/2016   Procedure: CATARACT EXTRACTION PHACO AND INTRAOCULAR LENS PLACEMENT (IOC);  Surgeon: Tonny Branch, MD;  Location: [facility];  Service: Ophthalmology;  Laterality: Left;  CDE: 5.48  . CATARACT EXTRACTION W/PHACO Right 08/18/2016   Procedure: CATARACT EXTRACTION PHACO AND INTRAOCULAR LENS PLACEMENT RIGHT EYE;  Surgeon: Tonny Branch, MD;  Location: [facility];  Service: Ophthalmology;  Laterality: Right;  CDE: 8.30   . COLONOSCOPY     12 years ago, Dr. Gala Romney, polyp  . complete hysterectomy  2000   fibroids  . TONSILLECTOMY    . TUBAL LIGATION      There were no vitals filed for this visit.  Subjective Assessment - 02/26/17 0822    Subjective  Patietn went to [facility]  yesterday for husband and reports her foot began hurting immediately while walking up-hill. She reports the highest her pain reached was a 4/10 and it lingered there for the remainder of the day. By late evening and this mornign she is only experiencing a 2/10. She reports good compliance with her HEP.    Limitations  Walking;Standing;House hold activities    How long can you sit comfortably?  unlimited    How long can you stand comfortably?  at least 15 minutes    How long can you walk comfortably?  about 2 minutes    Patient Stated Goals  waling further with no pain, at least 10 minues pain free    Currently in Pain?  Yes    Pain Score  2     Pain Location  Foot    Pain Orientation  Left    Pain Descriptors / Indicators  Aching;Tender    Pain Onset  More than a month ago    Pain Frequency  Constant    Aggravating Factors   walking, uphill    Pain Relieving Factors  rest    Effect of Pain on Daily Activities  difficulty with walking or standing for extended periods       [facility] PT Assessment - 02/26/17 0001  Assessment   Medical Diagnosis  Heel/Achilles Pain    Referring Provider  Wylene Simmer    Onset Date/Surgical Date  09/27/16    Hand Dominance  Left    Next MD Visit  about a month, unsure    Prior Therapy  shoulder and low back pain, ~ 2 years ago      Observation/Other Assessments   Focus on Therapeutic Outcomes (FOTO)   -- 52% lmited on 02/04/17      Squat   Comments  patient continues to have decreased depth, bilatearl knee valgus, and externally rotated       Single Leg Stance   Comments  RLE: 5 seconds; LLE: 10 seconds      AROM   Right Ankle Dorsiflexion  14    Right Ankle Plantar Flexion  50    Right Ankle Inversion  35    Right Ankle Eversion  25    Left Ankle Dorsiflexion  5    Left Ankle Plantar Flexion  50    Left Ankle Inversion  32    Left Ankle Eversion  22      Strength   Right Hip Flexion  5/5    Right Hip Extension  5/5    Right Hip ABduction   4+/5    Left Hip Flexion  5/5    Left Hip Extension  4+/5    Left Hip ABduction  4+/5    Right Knee Flexion  5/5    Right Knee Extension  5/5    Left Knee Flexion  5/5    Left Knee Extension  5/5    Right Ankle Dorsiflexion  5/5    Right Ankle Plantar Flexion  5/5    Right Ankle Inversion  5/5    Right Ankle Eversion  5/5    Left Ankle Dorsiflexion  5/5    Left Ankle Plantar Flexion  5/5    Left Ankle Inversion  5/5    Left Ankle Eversion  5/5        [facility] Adult PT Treatment/Exercise - 02/26/17 0001      Knee/Hip Exercises: Standing   Lateral Step Up  Both;1 set;15 reps;Step Height: 4"      Manual Therapy   Manual Therapy  Joint mobilization;Neural Stretch    Manual therapy comments  performed separately from all other skilled interventions    Joint Mobilization  AP grade III glide to talocrural joint to improve dorsiflexion on the left f      Ankle Exercises: Stretches   Gastroc Stretch  3 reps;30 seconds;Limitations    Gastroc Stretch Limitations  slant board      Ankle Exercises: Standing   Heel Raises  20 reps;Limitations    Heel Raises Limitations  2 sets: with small ball for posterior tibialis facilitation      Ankle Exercises: Seated   BAPS  Sitting;Level 2;10 reps;Limitations    BAPS Limitations  10 x counterclockwise; 10 x  clockwise    Other Seated Ankle Exercises  Seated Tibial Nerve glide: 10 reps with 3-5 second holds in knee flexion/dorsiflexion position        PT Education - 02/26/17 0848    Education provided  Yes    Education Details  Educatdon exercise and form throughout. Discussed progress towards goals and updated HEP with tibial nerve glide.    Person(s) Educated  Patient    Methods  Explanation;Handout    Comprehension  Verbalized understanding       PT Short Term  Goals - 02/26/17 0903      PT SHORT TERM GOAL #1   Title  Patient will be independent with HEP and be abel to demonstrate appropriate form/technique to be able to manage  independently when appropriate for discharge.    Baseline  02/26/17 - consistent compliance    Time  2    Period  Weeks    Status  Achieved      PT SHORT TERM GOAL #2   Title  Patient will improve MMT for limited muscle groups by 1/2 grade or more to demonstrate improved functional strength to support lower chain and decrease pain.    Baseline  02/26/17 - all improved by 1/2 grade for limited groups    Time  3    Period  Weeks    Status  Achieved      PT SHORT TERM GOAL #3   Title  Patient will report being able to perform 10 minutes of walking with her dog without an onset/increase in pain to improve mobiliyt and QOL.    Baseline  02/26/17 - patient walking on treadmill daily for 10 minutes with no incline and no increase in left foot pain    Time  3    Period  Weeks    Status  Achieved        PT Long Term Goals - 02/26/17 4944      PT LONG TERM GOAL #1   Title  Patient will improve MMT for limited muscle groups by 1 grade or more to demonstrate improved functional strength to support lower chain and decrease pain.    Baseline  02/26/17 - 1/2 grade improvement for groups tested    Time  6    Period  Weeks    Status  On-going      PT LONG TERM GOAL #2   Title  Patient will report being able to perform 15 minutes of upright weight bearing activity such as: cooking, cleaning, walking her dog without an onset/increase in pain to improve mobiliyt and QOL.    Baseline  02/26/17 - walkign for 10 mins daily on treadmill    Time  6    Period  Weeks    Status  On-going      PT LONG TERM GOAL #3   Title  Patient will have no reports of sharp/shooting pain radiating into plantar surface of foot with weight bearing activities with a maximum pain of 2/10 to demonstrate decreased tibial nerve irritation and improved activity tolerance to weight bearing activities.    Baseline  02/26/17 - pain primarily in posterior heel sometimes continues to move forward and plantar heel surface    Time  6     Period  Weeks    Status  On-going         Plan - 02/26/17 0854    Clinical Impression Statement  Re-assessment performed today and patient has met 3/3 short term goals and is progressing well towards long-term goals. She continues to have pain with activity however it has been trending down in severity and maximum pain being 4/10 rather than 6-7 /10 with patient being unable to bear weight due to pain. She has been able to advance proprioceptive exercises for her left foot/ankle and bilateral hip strengthening. She will continue to benefit from skilled physical therapy to address patient's deficits in ankle mobility, strength, hip strength, and overall functional mobility    Rehab Potential  Good    Clinical Impairments Affecting Rehab Potential  (+)  motivated, (+) supportive spouse, (+) positive prior experience with therapy    PT Frequency  3x / week    PT Duration  6 weeks    PT Treatment/Interventions  ADLs/Self Care Home Management;Cryotherapy;Moist Heat;Gait training;Stair training;Functional mobility training;Therapeutic activities;Therapeutic exercise;Balance training;Neuromuscular re-education;Patient/family education;Manual techniques;Passive range of motion;Taping    PT Next Visit Plan  progress BAPS level 2 to st anding, advance to step down on compliant surface for ankle proprioceptive training. Continue heel raises, bridges, clamshell, side step with theraband, gastroc/soleus stretch. Continue tibial nerve stretch and move to glides and joint mobilization for DF of left ankle. Continue BAPS training and continue proximal hip flexor strengthening. Side step, bridge, clamshell, consider adding step down/up.    PT Home Exercise Plan  02/10/17 - towel curl with toes, ankle inversion/eversion with towel, gastroc/soleus stretch; 02/17/17 - side step with theraband; 02/26/17 - tibial nerve glide    Consulted and Agree with Plan of Care  Patient       Patient will benefit from skilled  therapeutic intervention in order to improve the following deficits and impairments:  Decreased activity tolerance, Decreased balance, Abnormal gait, Decreased range of motion, Decreased strength, Hypomobility, Impaired sensation, Pain, Difficulty walking, Increased edema, Postural dysfunction  Visit Diagnosis: Pain in left foot  Stiffness of left foot, not elsewhere classified  Other symptoms and signs involving the musculoskeletal system     Problem List Patient Active Problem List   Diagnosis Date Noted  . Hx of colonic polyps 10/30/2010  . GERD (gastroesophageal reflux disease) 10/30/2010  . Constipation 10/30/2010    Kipp Brood, PT, DPT Physical Therapist with [facility]  02/26/2017 9:15 AM    [place] [address] [place], [place], [address] Phone: [phone]   Fax:  [phone]  Name: IFEOMA VALLIN MRN: [id number] Date of Birth: Oct 15, 1945

## 2017-02-26 NOTE — Patient Instructions (Addendum)
     Sitting Tibial Nerve Glide: 10 repetitions, hold position for 3-5 seconds  Start with the involved leg straight and toes pointed away from your head. Pull your knee and toes toward your chest.  Repeat the movement the prescribed amount.

## 2017-02-27 ENCOUNTER — Encounter (HOSPITAL_COMMUNITY): Payer: Self-pay | Admitting: Physical Therapy

## 2017-02-27 ENCOUNTER — Ambulatory Visit (HOSPITAL_COMMUNITY): Payer: Medicare Other | Attending: Orthopedic Surgery | Admitting: Physical Therapy

## 2017-02-27 ENCOUNTER — Other Ambulatory Visit: Payer: Self-pay

## 2017-02-27 DIAGNOSIS — R29898 Other symptoms and signs involving the musculoskeletal system: Secondary | ICD-10-CM | POA: Diagnosis present

## 2017-02-27 DIAGNOSIS — M79672 Pain in left foot: Secondary | ICD-10-CM | POA: Diagnosis not present

## 2017-02-27 DIAGNOSIS — M25675 Stiffness of left foot, not elsewhere classified: Secondary | ICD-10-CM | POA: Diagnosis present

## 2017-02-27 NOTE — Therapy (Addendum)
[facility] [address] [place], [place], [address] Phone: [phone]   Fax:  [phone]  Physical Therapy Treatment  Patient Details  Name: Barbara [name] MRN: [id number] Date of Birth: 1945-10-05 Referring Provider: Toni ArthursHewitt, John   Encounter Date: 02/27/2017  PT End of Session - 02/27/17 1058    Visit Number  7    Number of Visits  19    Date for PT Re-Evaluation  03/18/17    Authorization Type  [employer] Medicare    Authorization Time Period  02/04/17 - 03/18/17    PT Start Time  0819    PT Stop Time  0900    PT Time Calculation (min)  41 min    Equipment Utilized During Treatment  Gait belt Gait belt during balance exercise    Activity Tolerance  Patient tolerated treatment well    Behavior During Therapy  [facility] for tasks assessed/performed       Past Medical History:  Diagnosis Date  . Anxiety   . Asthma   . Bladder spasms   . DDD (degenerative disc disease)   . GERD (gastroesophageal reflux disease)   . Hypercholesteremia   . Sciatic nerve disease   . Sleep apnea     Past Surgical History:  Procedure Laterality Date  . ABDOMINAL HYSTERECTOMY    . BREAST BIOPSY     "precancer"-left  . CATARACT EXTRACTION W/PHACO Left 08/04/2016   Procedure: CATARACT EXTRACTION PHACO AND INTRAOCULAR LENS PLACEMENT (IOC);  Surgeon: Gemma PayorHunt, Kerry, MD;  Location: [facility];  Service: Ophthalmology;  Laterality: Left;  CDE: 5.48  . CATARACT EXTRACTION W/PHACO Right 08/18/2016   Procedure: CATARACT EXTRACTION PHACO AND INTRAOCULAR LENS PLACEMENT RIGHT EYE;  Surgeon: Gemma PayorHunt, Kerry, MD;  Location: [facility];  Service: Ophthalmology;  Laterality: Right;  CDE: 8.30   . COLONOSCOPY     12 years ago, Dr. Jena Gaussourk, polyp  . complete hysterectomy  2000   fibroids  . TONSILLECTOMY    . TUBAL LIGATION      There were no vitals filed for this visit.  Subjective Assessment - 02/27/17 1054    Subjective  Patient reported that she isn't having very much  pain today, maybe a 2/10 pain and she stated that she feels like therapy is helping her a lot.     Limitations  Walking;Standing;House hold activities    How long can you sit comfortably?  unlimited    How long can you stand comfortably?  at least 15 minutes    How long can you walk comfortably?  about 2 minutes    Patient Stated Goals  waling further with no pain, at least 10 minues pain free    Currently in Pain?  Yes    Pain Score  2     Pain Location  Ankle    Pain Orientation  Left    Pain Descriptors / Indicators  Aching    Pain Onset  More than a month ago    Multiple Pain Sites  No                      [facility] Adult PT Treatment/Exercise - 02/27/17 0001      Knee/Hip Exercises: Standing   Forward Step Up  Left;Right;1 set;10 reps;Hand Hold: 2 Step up onto BOSU    Step Down  Left;Right;1 set;10 reps;Hand Hold: 2 BOSU step down      Manual Therapy   Manual Therapy  Joint mobilization  Manual therapy comments  performed separately from all other skilled interventions    Joint Mobilization  AP grade III glide to talocrural joint to improve dorsiflexion on the left 3 x 45 seconds      Ankle Exercises: Supine   Other Supine Ankle Exercises  Bridges with both legs x 15; holding for 10 seconds    Other Supine Ankle Exercises  Clamshells with red theraband around both lower extremities 2 x 10       Ankle Exercises: Stretches   Gastroc Stretch  3 reps;30 seconds;Limitations    Gastroc Stretch Limitations  slant board      Ankle Exercises: Standing   BAPS  Level 2;Standing;10 reps;Limitations Forward/back; left/right; Clockwise; counterclockwise    Heel Raises  20 reps  2 sets 10 reps    Heel Raises Limitations  2 sets: with small ball for posterior tibialis facilitation    Other Standing Ankle Exercises  Sidestepping with red theraband 15 feet x 3 each direction      Ankle Exercises: Seated   Other Seated Ankle Exercises  Seated Tibial Nerve glide: 10 reps with  3-5 second holds in knee flexion/dorsiflexion position    Other Seated Ankle Exercises  Ankle strengthening 15 reps with red theraband: plantarflexion, dorsiflexion, inversion, eversion              PT Education - 02/27/17 1057    Education provided  Yes    Education Details  Patient was educated on exercise technique and form throughout session, added step ups and downs on BOSU this session and patient was educated with demonstration and verbal cues on this.     Person(s) Educated  Patient    Methods  Explanation;Demonstration    Comprehension  Verbalized understanding;Returned demonstration;Verbal cues required       PT Short Term Goals - 02/26/17 0903      PT SHORT TERM GOAL #1   Title  Patient will be independent with HEP and be abel to demonstrate appropriate form/technique to be able to manage independently when appropriate for discharge.    Baseline  02/26/17 - consistent compliance    Time  2    Period  Weeks    Status  Achieved      PT SHORT TERM GOAL #2   Title  Patient will improve MMT for limited muscle groups by 1/2 grade or more to demonstrate improved functional strength to support lower chain and decrease pain.    Baseline  02/26/17 - all improved by 1/2 grade for limited groups    Time  3    Period  Weeks    Status  Achieved      PT SHORT TERM GOAL #3   Title  Patient will report being able to perform 10 minutes of walking with her dog without an onset/increase in pain to improve mobiliyt and QOL.    Baseline  02/26/17 - patient walking on treadmill daily for 10 minutes with no incline and no increase in left foot pain    Time  3    Period  Weeks    Status  Achieved        PT Long Term Goals - 02/26/17 6045      PT LONG TERM GOAL #1   Title  Patient will improve MMT for limited muscle groups by 1 grade or more to demonstrate improved functional strength to support lower chain and decrease pain.    Baseline  02/26/17 - 1/2 grade improvement for groups  tested    Time  6    Period  Weeks    Status  On-going      PT LONG TERM GOAL #2   Title  Patient will report being able to perform 15 minutes of upright weight bearing activity such as: cooking, cleaning, walking her dog without an onset/increase in pain to improve mobiliyt and QOL.    Baseline  02/26/17 - walkign for 10 mins daily on treadmill    Time  6    Period  Weeks    Status  On-going      PT LONG TERM GOAL #3   Title  Patient will have no reports of sharp/shooting pain radiating into plantar surface of foot with weight bearing activities with a maximum pain of 2/10 to demonstrate decreased tibial nerve irritation and improved activity tolerance to weight bearing activities.    Baseline  02/26/17 - pain primarily in posterior heel sometimes continues to move forward and plantar heel surface    Time  6    Period  Weeks    Status  On-going            Plan - 02/27/17 1059    Clinical Impression Statement  This session continued to progress patient with mobility and strengthening exercises for the left ankle. This session patient was able to progress with BAPS board in standing level 2 to improve ankle mobility. Patient reported some discomfort in knee with ankle circles after, but the discomfort did not last. Plan to discontinue rotations in standing for next session. In addition added step ups and downs on BOSU this session to further challenge patient on a compliant surface with functional activities. Patient tolerated this well with bilateral upper extremity support in parallel bars. Patient did not report an increase in pain at the end of the session. Patient would continue to benefit from skilled physical therapy in order to address ankle mobility, strength, and lower extremity strength particularly with functional activities.     Rehab Potential  Good    Clinical Impairments Affecting Rehab Potential  (+) motivated, (+) supportive spouse, (+) positive prior experience with  therapy    PT Frequency  3x / week    PT Duration  6 weeks    PT Treatment/Interventions  ADLs/Self Care Home Management;Cryotherapy;Moist Heat;Gait training;Stair training;Functional mobility training;Therapeutic activities;Therapeutic exercise;Balance training;Neuromuscular re-education;Patient/family education;Manual techniques;Passive range of motion;Taping    PT Next Visit Plan  Continue BAPS level 2 in standing but discontinue ankle rotations to avoid knee discomfort, continue with step ups and step downs on compliant surface for ankle proprioceptive training. Continue heel raises, bridges, clamshell, side step with theraband, gastroc/soleus stretch. Continue tibial nerve stretch and move to glides and joint mobilization for DF of left ankle. Continue BAPS training and continue proximal hip flexor strengthening.    PT Home Exercise Plan  02/10/17 - towel curl with toes, ankle inversion/eversion with towel, gastroc/soleus stretch; 02/17/17 - side step with theraband; 02/26/17 - tibial nerve glide    Consulted and Agree with Plan of Care  Patient       Patient will benefit from skilled therapeutic intervention in order to improve the following deficits and impairments:  Decreased activity tolerance, Decreased balance, Abnormal gait, Decreased range of motion, Decreased strength, Hypomobility, Impaired sensation, Pain, Difficulty walking, Increased edema, Postural dysfunction  Visit Diagnosis: Pain in left foot  Stiffness of left foot, not elsewhere classified  Other symptoms and signs involving the musculoskeletal system     Problem List  Patient Active Problem List   Diagnosis Date Noted  . Hx of colonic polyps 10/30/2010  . GERD (gastroesophageal reflux disease) 10/30/2010  . Constipation 10/30/2010   Barbara [name] PT, DPT 11:06 AM, 02/27/17 [phone]  [facility] [address] [place], [place], [address] Phone: [phone]   Fax:   [phone]  Name: Barbara [name] MRN: [id number] Date of Birth: 19-Aug-1945

## 2017-03-02 ENCOUNTER — Encounter (HOSPITAL_COMMUNITY): Payer: Medicare Other

## 2017-03-04 ENCOUNTER — Encounter (HOSPITAL_COMMUNITY): Payer: Self-pay | Admitting: Physical Therapy

## 2017-03-04 ENCOUNTER — Other Ambulatory Visit: Payer: Self-pay

## 2017-03-04 ENCOUNTER — Ambulatory Visit (HOSPITAL_COMMUNITY): Payer: Medicare Other | Admitting: Physical Therapy

## 2017-03-04 DIAGNOSIS — M25675 Stiffness of left foot, not elsewhere classified: Secondary | ICD-10-CM

## 2017-03-04 DIAGNOSIS — M79672 Pain in left foot: Secondary | ICD-10-CM

## 2017-03-04 DIAGNOSIS — R29898 Other symptoms and signs involving the musculoskeletal system: Secondary | ICD-10-CM

## 2017-03-04 NOTE — Therapy (Signed)
[facility] [address] , [place], [address] Phone: [phone]   Fax:  [phone]  Physical Therapy Treatment  Patient Details  Name: Barbara [name] MRN: [id number] Date of Birth: Nov 16, 1945 Referring Provider: Toni ArthursHewitt, John   Encounter Date: 03/04/2017  PT End of Session - 03/04/17 0827    Visit Number  8    Number of Visits  19    Date for PT Re-Evaluation  03/18/17    Authorization Type  [employer] Medicare    Authorization Time Period  02/04/17 - 03/18/17    PT Start Time  0820    PT Stop Time  0859    PT Time Calculation (min)  39 min    Equipment Utilized During Treatment  Gait belt Gait belt during balance exercise    Activity Tolerance  Patient tolerated treatment well    Behavior During Therapy  WFL for tasks assessed/performed       Past Medical History:  Diagnosis Date  . Anxiety   . Asthma   . Bladder spasms   . DDD (degenerative disc disease)   . GERD (gastroesophageal reflux disease)   . Hypercholesteremia   . Sciatic nerve disease   . Sleep apnea     Past Surgical History:  Procedure Laterality Date  . ABDOMINAL HYSTERECTOMY    . BREAST BIOPSY     "precancer"-left  . CATARACT EXTRACTION W/PHACO Left 08/04/2016   Procedure: CATARACT EXTRACTION PHACO AND INTRAOCULAR LENS PLACEMENT (IOC);  Surgeon: Gemma PayorHunt, Kerry, MD;  Location: [facility];  Service: Ophthalmology;  Laterality: Left;  CDE: 5.48  . CATARACT EXTRACTION W/PHACO Right 08/18/2016   Procedure: CATARACT EXTRACTION PHACO AND INTRAOCULAR LENS PLACEMENT RIGHT EYE;  Surgeon: Gemma PayorHunt, Kerry, MD;  Location: [facility];  Service: Ophthalmology;  Laterality: Right;  CDE: 8.30   . COLONOSCOPY     12 years ago, Dr. Jena Gaussourk, polyp  . complete hysterectomy  2000   fibroids  . TONSILLECTOMY    . TUBAL LIGATION      There were no vitals filed for this visit.  Subjective Assessment - 03/04/17 0823    Subjective  Patient reported that she is having 2/10 pain in  the back of her left heel that is burning.     Limitations  Walking;Standing;House hold activities    How long can you sit comfortably?  unlimited    How long can you stand comfortably?  at least 15 minutes    How long can you walk comfortably?  about 2 minutes    Patient Stated Goals  waling further with no pain, at least 10 minues pain free    Pain Score  2     Pain Location  Heel    Pain Orientation  Left    Pain Descriptors / Indicators  Burning    Pain Onset  More than a month ago    Multiple Pain Sites  No                      [facility] Adult PT Treatment/Exercise - 03/04/17 0001      Knee/Hip Exercises: Standing   Forward Lunges  Left;1 set;15 reps;Right No upper extremity support    Lateral Step Up  Both;1 set;15 reps On BOSU    Forward Step Up  Left;Right;1 set;10 reps;Hand Hold: 2 On BOSU    Step Down  Left;Right;1 set;10 reps;Hand Hold: 2 On BOSU      Manual Therapy   Manual  Therapy  Joint mobilization    Manual therapy comments  performed separately from all other skilled interventions    Joint Mobilization  AP grade III glide to talocrural joint to improve dorsiflexion on the left 1 x 30 seconds discontinued due to increase in patient's pain      Ankle Exercises: Supine   Other Supine Ankle Exercises  Bridges with both legs x 15; holding for 10 seconds    Other Supine Ankle Exercises  Clamshells with green theraband around both lower extremities thighs 2 x 10       Ankle Exercises: Seated   Other Seated Ankle Exercises  Seated Tibial Nerve glide: 2x 10 reps with 3-5 second holds in knee flexion/dorsiflexion position    Other Seated Ankle Exercises  Ankle strengthening 15 reps with red theraband: plantarflexion, dorsiflexion, inversion, eversion       Ankle Exercises: Standing   BAPS  Level 2;Standing;10 reps;Limitations 10 repetitions DF/PF; 10 repetitions Left/right. left LE    Heel Raises  20 reps 2 sets 10 repetitions    Heel Raises Limitations  2  sets: with small ball for posterior tibialis facilitation    Other Standing Ankle Exercises  Sidestepping with green theraband 15 feet x 3 each direction    Other Standing Ankle Exercises  Tandem balance on foam 3x30 each lower extremity; SLS on each lower extremity x 5 trials on foam      Ankle Exercises: Stretches   Gastroc Stretch  3 reps;30 seconds;Limitations    Gastroc Stretch Limitations  slant board             PT Education - 03/04/17 [phone]    Education provided  Yes    Education Details  Patient was educated on technique and purpose of interventions throughout session.     Person(s) Educated  Patient    Methods  Explanation;Demonstration    Comprehension  Verbalized understanding;Returned demonstration;Verbal cues required       PT Short Term Goals - 02/26/17 0903      PT SHORT TERM GOAL #1   Title  Patient will be independent with HEP and be abel to demonstrate appropriate form/technique to be able to manage independently when appropriate for discharge.    Baseline  02/26/17 - consistent compliance    Time  2    Period  Weeks    Status  Achieved      PT SHORT TERM GOAL #2   Title  Patient will improve MMT for limited muscle groups by 1/2 grade or more to demonstrate improved functional strength to support lower chain and decrease pain.    Baseline  02/26/17 - all improved by 1/2 grade for limited groups    Time  3    Period  Weeks    Status  Achieved      PT SHORT TERM GOAL #3   Title  Patient will report being able to perform 10 minutes of walking with her dog without an onset/increase in pain to improve mobiliyt and QOL.    Baseline  02/26/17 - patient walking on treadmill daily for 10 minutes with no incline and no increase in left foot pain    Time  3    Period  Weeks    Status  Achieved        PT Long Term Goals - 02/26/17 9604      PT LONG TERM GOAL #1   Title  Patient will improve MMT for limited muscle groups by 1 grade or  more to demonstrate  improved functional strength to support lower chain and decrease pain.    Baseline  02/26/17 - 1/2 grade improvement for groups tested    Time  6    Period  Weeks    Status  On-going      PT LONG TERM GOAL #2   Title  Patient will report being able to perform 15 minutes of upright weight bearing activity such as: cooking, cleaning, walking her dog without an onset/increase in pain to improve mobiliyt and QOL.    Baseline  02/26/17 - walkign for 10 mins daily on treadmill    Time  6    Period  Weeks    Status  On-going      PT LONG TERM GOAL #3   Title  Patient will have no reports of sharp/shooting pain radiating into plantar surface of foot with weight bearing activities with a maximum pain of 2/10 to demonstrate decreased tibial nerve irritation and improved activity tolerance to weight bearing activities.    Baseline  02/26/17 - pain primarily in posterior heel sometimes continues to move forward and plantar heel surface    Time  6    Period  Weeks    Status  On-going            Plan - 03/04/17 1121    Clinical Impression Statement  This session continued to progress patient with ankle mobility, strengthening, and functional mobility activities. This session manual therapy was discontinued after 30 seconds due to an increase in patient's symptoms. Patient demonstrated increased difficulty with BAPS in standing this session with dorsiflexion plantarflexion and left and right movements. This session progressed patient to performing lateral step ups on BOSU which patient tolerated well. This session also included standing balance exercises on foam to challenge patient's muscular ankle stabilizers. Patient would continue to benefit from skilled physical therapy in order to progress toward goals and continue improving ankle mobility, strength, and overall functional mobility.     Rehab Potential  Good    Clinical Impairments Affecting Rehab Potential  (+) motivated, (+) supportive spouse,  (+) positive prior experience with therapy    PT Frequency  3x / week    PT Duration  6 weeks    PT Treatment/Interventions  ADLs/Self Care Home Management;Cryotherapy;Moist Heat;Gait training;Stair training;Functional mobility training;Therapeutic activities;Therapeutic exercise;Balance training;Neuromuscular re-education;Patient/family education;Manual techniques;Passive range of motion;Taping    PT Next Visit Plan  Continue BAPS level 2 in standing but discontinue ankle rotations to avoid knee discomfort, continue with step ups and step downs on compliant surface for ankle proprioceptive training. Continue heel raises, bridges, clamshell, side step with theraband, gastroc/soleus stretch. Continue tibial nerve stretch and move to glides and joint mobilization for DF of left ankle. Continue BAPS training and continue proximal hip flexor strengthening.    PT Home Exercise Plan  02/10/17 - towel curl with toes, ankle inversion/eversion with towel, gastroc/soleus stretch; 02/17/17 - side step with theraband; 02/26/17 - tibial nerve glide    Consulted and Agree with Plan of Care  Patient       Patient will benefit from skilled therapeutic intervention in order to improve the following deficits and impairments:  Decreased activity tolerance, Decreased balance, Abnormal gait, Decreased range of motion, Decreased strength, Hypomobility, Impaired sensation, Pain, Difficulty walking, Increased edema, Postural dysfunction  Visit Diagnosis: Pain in left foot  Stiffness of left foot, not elsewhere classified  Other symptoms and signs involving the musculoskeletal system     Problem List Patient  Active Problem List   Diagnosis Date Noted  . Hx of colonic polyps 10/30/2010  . GERD (gastroesophageal reflux disease) 10/30/2010  . Constipation 10/30/2010   Barbara [name] PT, DPT 11:24 AM, 03/04/17 [phone]  [facility] [address] [place], [place],  [address] Phone: [phone]   Fax:  [phone]  Name: Barbara [name] MRN: [id number] Date of Birth: 15-Feb-1945

## 2017-03-06 ENCOUNTER — Telehealth (HOSPITAL_COMMUNITY): Payer: Self-pay | Admitting: Family Medicine

## 2017-03-06 ENCOUNTER — Ambulatory Visit (HOSPITAL_COMMUNITY): Payer: Medicare Other | Admitting: Physical Therapy

## 2017-03-06 NOTE — Telephone Encounter (Signed)
2/pt left a message to cx today but no reason was given8/19

## 2017-04-14 ENCOUNTER — Encounter (HOSPITAL_COMMUNITY): Payer: Self-pay

## 2017-04-14 ENCOUNTER — Ambulatory Visit (HOSPITAL_COMMUNITY): Payer: Medicare Other | Attending: Orthopedic Surgery

## 2017-04-14 ENCOUNTER — Other Ambulatory Visit: Payer: Self-pay

## 2017-04-14 DIAGNOSIS — R29898 Other symptoms and signs involving the musculoskeletal system: Secondary | ICD-10-CM | POA: Diagnosis not present

## 2017-04-14 DIAGNOSIS — G8929 Other chronic pain: Secondary | ICD-10-CM | POA: Diagnosis present

## 2017-04-14 DIAGNOSIS — M25612 Stiffness of left shoulder, not elsewhere classified: Secondary | ICD-10-CM | POA: Diagnosis present

## 2017-04-14 DIAGNOSIS — M25512 Pain in left shoulder: Secondary | ICD-10-CM | POA: Diagnosis present

## 2017-04-14 NOTE — Patient Instructions (Signed)
Complete the following exercises 3-5 times a day. Complete each exercise for 1-2 minutes.    SHOULDER: Flexion On Table   Place hands on table, elbows straight. Move hips away from body. Press hands down into table. Hold ___ seconds. ___ reps per set, ___ sets per day, ___ days per week  Abduction (Passive) *Palm down  With arm out to side, resting on table, lower head toward arm, keeping trunk away from table. Hold ____ seconds. Repeat ____ times. Do ____ sessions per day.  Copyright  VHI. All rights reserved.     Internal Rotation (Assistive)   Seated with elbow bent at right angle and held against side, slide arm on table surface in an inward arc. Repeat ____ times. Do ____ sessions per day. Activity: Use this motion to brush crumbs off the table.  Copyright  VHI. All rights reserved.

## 2017-04-14 NOTE — Therapy (Signed)
[name] [address] [place], [place], [address] Phone: [phone]   Fax:  [phone]  Occupational Therapy Evaluation  Patient Details  Name: Barbara [name] MRN: [id number] Date of Birth: 01-01-1946 Referring Provider: Dominica Severin, MD   Encounter Date: 04/14/2017  OT End of Session - 04/14/17 0918    Visit Number  1    Number of Visits  16    Date for OT Re-Evaluation  06/13/17 mini reassess; 05/12/17    Authorization Type  [employer] Medicare $20 co-pay no visit limit. Based on medical necessity.    OT Start Time  0820    OT Stop Time  0900    OT Time Calculation (min)  40 min    Activity Tolerance  Patient tolerated treatment well;Patient limited by pain    Behavior During Therapy  [facility] for tasks assessed/performed       Past Medical History:  Diagnosis Date  . Anxiety   . Asthma   . Bladder spasms   . DDD (degenerative disc disease)   . GERD (gastroesophageal reflux disease)   . Hypercholesteremia   . Sciatic nerve disease   . Sleep apnea     Past Surgical History:  Procedure Laterality Date  . ABDOMINAL HYSTERECTOMY    . BREAST BIOPSY     "precancer"-left  . CATARACT EXTRACTION W/PHACO Left 08/04/2016   Procedure: CATARACT EXTRACTION PHACO AND INTRAOCULAR LENS PLACEMENT (IOC);  Surgeon: Gemma Payor, MD;  Location: [facility];  Service: Ophthalmology;  Laterality: Left;  CDE: 5.48  . CATARACT EXTRACTION W/PHACO Right 08/18/2016   Procedure: CATARACT EXTRACTION PHACO AND INTRAOCULAR LENS PLACEMENT RIGHT EYE;  Surgeon: Gemma Payor, MD;  Location: [facility];  Service: Ophthalmology;  Laterality: Right;  CDE: 8.30   . COLONOSCOPY     12 years ago, Dr. Jena Gauss, polyp  . complete hysterectomy  2000   fibroids  . TONSILLECTOMY    . TUBAL LIGATION      There were no vitals filed for this visit.  Subjective Assessment - 04/14/17 0911    Subjective   S: I can't do much with this arm.    Pertinent History  Patient is a 72 y/o  female S/P left frozen shoulder which has presented itself 6 months ago. Patient received a shot for pain at her last appointment with Dr. Amanda Pea. Dr. Amanda Pea has referred patient to occupational therapy for evaluation and treatment.     Patient Stated Goals  To be able to use her left arm as her dominant extremity.     Currently in Pain?  Yes    Pain Score  3     Pain Location  Shoulder    Pain Orientation  Left    Pain Descriptors / Indicators  Aching;Sore    Pain Type  Chronic pain    Pain Radiating Towards  Up to neck    Pain Onset  More than a month ago    Pain Frequency  Constant    Aggravating Factors   movement and use    Pain Relieving Factors  rest    Effect of Pain on Daily Activities  unable to complete all daily tasks with LUE as dominant. Severe effect    Multiple Pain Sites  No        [facility] OT Assessment - 04/14/17 0832      Assessment   Medical Diagnosis  Left Frozen Shoulder    Referring Provider  Dominica Severin, MD  Onset Date/Surgical Date  -- 6 months ago    Hand Dominance  Left    Next MD Visit  May 07, 2017    Prior Therapy  None for this condition      Precautions   Precautions  None    Precaution Comments  Gentle P/ROM. AA/ROM, A/ROM      Restrictions   Weight Bearing Restrictions  No      Balance Screen   Has the patient fallen in the past 6 months  No      Home  Environment   Family/patient expects to be discharged to:  Private residence      Prior Function   Level of Independence  Independent    Vocation  Retired    Leisure  Walking the dog      ADL   ADL comments  patient is able to wash dishes and pick up light weight clothing. Otherwise, patient is having extreme difficulty using her left UE as her dominant extremtiy.       Mobility   Mobility Status  Independent      Written Expression   Dominant Hand  Left      Cognition   Overall Cognitive Status  Within Functional Limits for tasks assessed      Observation/Other  Assessments   Focus on Therapeutic Outcomes (FOTO)   41/100      ROM / Strength   AROM / PROM / Strength  AROM;PROM;Strength      Palpation   Palpation comment  Max fascial restrictions in left upper arm, trapezius, and scapularis region.      AROM   Overall AROM Comments  Assessed seated. IR/er adducted    AROM Assessment Site  Shoulder    Right/Left Shoulder  Left    Left Shoulder Flexion  106 Degrees    Left Shoulder ABduction  79 Degrees    Left Shoulder Internal Rotation  90 Degrees    Left Shoulder External Rotation  65 Degrees      PROM   Overall PROM Comments  Assessed supine. IR/er adducted    PROM Assessment Site  Shoulder    Right/Left Shoulder  Left    Left Shoulder Flexion  106 Degrees    Left Shoulder ABduction  70 Degrees    Left Shoulder Internal Rotation  90 Degrees    Left Shoulder External Rotation  70 Degrees      Strength   Overall Strength Comments  Assessed seated. IR/er adducted.    Strength Assessment Site  Shoulder    Right/Left Shoulder  Left    Left Shoulder Flexion  3-/5    Left Shoulder ABduction  3-/5    Left Shoulder Internal Rotation  5/5    Left Shoulder External Rotation  4/5                      OT Education - 04/14/17 0917    Education provided  Yes    Education Details  Reviewed assessment findings, discussed goal fabrication, table slides for HEP.    Person(s) Educated  Patient    Methods  Explanation;Demonstration;Verbal cues;Handout    Comprehension  Returned demonstration;Verbalized understanding       OT Short Term Goals - 04/14/17 0925      OT SHORT TERM GOAL #1   Title  Patient will be educated and independent with HEP in order to increase functional use and mobility of her LUE.    Time  4  Period  Weeks    Status  New    Target Date  05/12/17      OT SHORT TERM GOAL #2   Title  Patient will increase P/ROM to [facility] to increase ability to complete dressing and bathing tasks with less difficulty.      Time  4    Period  Weeks    Status  New      OT SHORT TERM GOAL #3   Title  Patient will increase LUE strength to 3+/5 to increase ability to complete lightweight household tasks using LUE.    Time  4    Period  Weeks    Status  New      OT SHORT TERM GOAL #4   Title  Patient will decrease pain level to 5/10 in LUE during daily tasks.     Time  4    Period  Weeks    Status  New      OT SHORT TERM GOAL #5   Title  Patient will decrease fascial restrictions to mod amount in left UE to increase mobility needed for functional reaching tasks.     Time  4    Period  Weeks    Status  New        OT Long Term Goals - 04/14/17 0929      OT LONG TERM GOAL #1   Title  Patient will return to highest level of independence using LUE as dominant extremity.     Time  8    Period  Weeks    Status  New    Target Date  06/09/17      OT LONG TERM GOAL #2   Title  Patient will increase LUE A/ROM to [facility] to increase ability to complete hair washing and other reaching overhead activities.     Time  8    Period  Weeks    Status  New      OT LONG TERM GOAL #3   Title  Patient will increase LUE strength to 4+/5 to increase ability to return to normal lifting activities at home.    Time  8    Period  Weeks    Status  New      OT LONG TERM GOAL #4   Title  Patient will decrease pain level in LUE during daily task to approximately 2 or 3/10.    Time  8    Period  Weeks    Status  New      OT LONG TERM GOAL #5   Title  Patient will decrease fascial restrictions to min amount or less in LUE in order to increase functional mobility needed to complete overhead reaching tasks.     Time  8    Period  Weeks    Status  New            Plan - 04/14/17 1610    Clinical Impression Statement  A: Patient is a 72 y/o female S/P left frozen shoulder causing increase pain, fascial restrictions and decrease ROM and strength resulting in difficulty completing daily tasks with her LUE.      Occupational Profile and client history currently impacting functional performance  motivated to return to prior level of function, strong social support at home.     Occupational performance deficits (Please refer to evaluation for details):  ADL's;IADL's;Rest and Sleep;Leisure    Rehab Potential  Excellent    Current Impairments/barriers affecting progress:  Elevated  pain level with use and movement    OT Frequency  2x / week    OT Duration  8 weeks    OT Treatment/Interventions  Self-care/ADL training;Traction;Therapeutic exercise;Manual Therapy;Ultrasound;Therapeutic activities;DME and/or AE instruction;Cryotherapy;Electrical Stimulation;Moist Heat;Passive range of motion;Patient/family education    Plan  P: Patient will benefit from skilled OT services to increase functional performance of LUE as dominant extremity. Treatment Plan: P/ROM, AA/ROM, A/ROM, general shoulder and scapular strengthening. Myofascial release. Modalities PRN.    Clinical Decision Making  Several treatment options, min-mod task modification necessary    Consulted and Agree with Plan of Care  Patient       Patient will benefit from skilled therapeutic intervention in order to improve the following deficits and impairments:  Decreased activity tolerance, Decreased strength, Decreased range of motion, Pain, Impaired UE functional use, Increased fascial restrictions  Visit Diagnosis: Other symptoms and signs involving the musculoskeletal system - Plan: Ot plan of care cert/re-cert  Chronic left shoulder pain - Plan: Ot plan of care cert/re-cert  Stiffness of left shoulder, not elsewhere classified - Plan: Ot plan of care cert/re-cert    Problem List Patient Active Problem List   Diagnosis Date Noted  . Hx of colonic polyps 10/30/2010  . GERD (gastroesophageal reflux disease) 10/30/2010  . Constipation 10/30/2010   Limmie PatriciaLaura Essenmacher, OTR/L,CBIS  [phone]  04/14/2017, 9:34 AM  [facility] [address] [place], [place], [address] Phone: [phone]   Fax:  [phone]  Name: Haynes Hoehnheresa D Batts MRN: [id number] Date of Birth: 06-Apr-1945

## 2017-04-17 ENCOUNTER — Other Ambulatory Visit: Payer: Self-pay

## 2017-04-17 ENCOUNTER — Encounter (HOSPITAL_COMMUNITY): Payer: Self-pay

## 2017-04-17 ENCOUNTER — Ambulatory Visit (HOSPITAL_COMMUNITY): Payer: Medicare Other

## 2017-04-17 DIAGNOSIS — R29898 Other symptoms and signs involving the musculoskeletal system: Secondary | ICD-10-CM | POA: Diagnosis not present

## 2017-04-17 DIAGNOSIS — G8929 Other chronic pain: Secondary | ICD-10-CM

## 2017-04-17 DIAGNOSIS — M25612 Stiffness of left shoulder, not elsewhere classified: Secondary | ICD-10-CM

## 2017-04-17 DIAGNOSIS — M25512 Pain in left shoulder: Secondary | ICD-10-CM

## 2017-04-17 NOTE — Patient Instructions (Signed)
Repeat all exercises 10-15 times, 1-2 times per day.  1) Shoulder Protraction    Begin with elbows by your side, slowly "punch" straight out in front of you.      2) Shoulder Flexion  Standing:         Begin with arms at your side with thumbs pointed up, slowly raise both arms up and forward towards overhead.       3) Horizontal abduction/adduction   Standing:           Begin with arms straight out in front of you, bring out to the side in at "T" shape. Keep arms straight entire time.        4) Internal & External Rotation    *No band* -Stand with elbows at the side and elbows bent 90 degrees. Move your forearms away from your body, then bring back inward toward the body.     5) Shoulder Abduction   Standing:       Lying on your back begin with your arms flat on the table next to your side. Slowly move your arms out to the side so that they go overhead, in a jumping jack or snow angel movement.    6) X to V arms (cheerleader move):  Begin with arms straight down, crossed in front of body in an "X". Keeping arms crossed, lift arms straight up overhead. Then spread arms apart into a "V" shape.  Bring back together into x and lower down to starting position.    

## 2017-04-17 NOTE — Therapy (Signed)
[facility] [address] [place], [place], [address] Phone: [phone]   Fax:  [phone]  Occupational Therapy Treatment  Patient Details  Name: Barbara [name] MRN: [id number] Date of Birth: [birth year] Referring Provider: Dominica Severin, MD   Encounter Date: 04/17/2017  OT End of Session - 04/17/17 1005    Visit Number  2    Number of Visits  16    Date for OT Re-Evaluation  06/13/17 mini reassess; 05/12/17    Authorization Type  [employer] Medicare $20 co-pay no visit limit. Based on medical necessity.    OT Start Time  [phone]    OT Stop Time  0902    OT Time Calculation (min)  43 min    Activity Tolerance  Patient tolerated treatment well    Behavior During Therapy  [facility] for tasks assessed/performed       Past Medical History:  Diagnosis Date  . Anxiety   . Asthma   . Bladder spasms   . DDD (degenerative disc disease)   . GERD (gastroesophageal reflux disease)   . Hypercholesteremia   . Sciatic nerve disease   . Sleep apnea     Past Surgical History:  Procedure Laterality Date  . ABDOMINAL HYSTERECTOMY    . BREAST BIOPSY     "precancer"-left  . CATARACT EXTRACTION W/PHACO Left 08/04/2016   Procedure: CATARACT EXTRACTION PHACO AND INTRAOCULAR LENS PLACEMENT (IOC);  Surgeon: Gemma Payor, MD;  Location: [facility];  Service: Ophthalmology;  Laterality: Left;  CDE: 5.48  . CATARACT EXTRACTION W/PHACO Right 08/18/2016   Procedure: CATARACT EXTRACTION PHACO AND INTRAOCULAR LENS PLACEMENT RIGHT EYE;  Surgeon: Gemma Payor, MD;  Location: [facility];  Service: Ophthalmology;  Laterality: Right;  CDE: 8.30   . COLONOSCOPY     12 years ago, Dr. Jena Gauss, polyp  . complete hysterectomy  2000   fibroids  . TONSILLECTOMY    . TUBAL LIGATION      There were no vitals filed for this visit.  Subjective Assessment - 04/17/17 0836    Subjective   S: The exercise with my palm down on the table is probably the hardest one.     Currently in Pain?  Yes     Pain Score  4     Pain Location  Shoulder    Pain Orientation  Left    Pain Descriptors / Indicators  Aching;Sore    Pain Type  Chronic pain         [facility] OT Assessment - 04/17/17 9528      Assessment   Medical Diagnosis  Left Frozen Shoulder      Precautions   Precautions  None    Precaution Comments  Gentle P/ROM. AA/ROM, A/ROM               OT Treatments/Exercises (OP) - 04/17/17 4132      Exercises   Exercises  Shoulder      Shoulder Exercises: Supine   Protraction  PROM;AROM;10 reps    Horizontal ABduction  PROM;AROM;10 reps    External Rotation  PROM;AROM;10 reps    Internal Rotation  PROM;AROM;10 reps    Flexion  PROM;AROM;10 reps    ABduction  PROM;AROM;10 reps      Shoulder Exercises: Standing   Protraction  AROM;10 reps    Horizontal ABduction  AROM;10 reps    External Rotation  AROM;10 reps    Internal Rotation  AROM;10 reps    Flexion  AROM;10 reps  ABduction  AROM;10 reps      Shoulder Exercises: Pulleys   Flexion  1 minute      Shoulder Exercises: ROM/Strengthening   Wall Wash  1'    X to V Arms  10X      Manual Therapy   Manual Therapy  Myofascial release    Manual therapy comments  performed separately from all other skilled interventions    Myofascial Release  Myofascial release and manual stretching completed to left upper arm, trapezius, and scapularis region to decreased fascial restrictions and increase joint mobiility in a pain free zone.              OT Education - 04/17/17 1004    Education provided  Yes    Education Details  Pt provided with OT evaluation print out. Reviewed goals and plan of care. Upgraded HEP to A/ROM shoulder exercises. Pt is to stop previous exercises.     Person(s) Educated  Patient    Methods  Explanation;Demonstration;Verbal cues;Handout    Comprehension  Verbalized understanding;Returned demonstration       OT Short Term Goals - 04/17/17 1007      OT SHORT TERM GOAL #1   Title   Patient will be educated and independent with HEP in order to increase functional use and mobility of her LUE.    Time  4    Period  Weeks    Status  On-going      OT SHORT TERM GOAL #2   Title  Patient will [facility] to [facility] to increase ability to complete dressing and bathing tasks with less difficulty.     Time  4    Period  Weeks    Status  On-going      OT SHORT TERM GOAL #3   Title  Patient will increase LUE strength to 3+/5 to increase ability to complete lightweight household tasks using LUE.    Time  4    Period  Weeks    Status  On-going      OT SHORT TERM GOAL #4   Title  Patient will decrease pain level to 5/10 in LUE during daily tasks.     Time  4    Period  Weeks    Status  On-going      OT SHORT TERM GOAL #5   Title  Patient will decrease fascial restrictions to mod amount in left UE to increase mobility needed for functional reaching tasks.     Time  4    Period  Weeks    Status  On-going        OT Long Term Goals - 04/17/17 1007      OT LONG TERM GOAL #1   Title  Patient will return to highest level of independence using LUE as dominant extremity.     Time  8    Period  Weeks    Status  On-going      OT LONG TERM GOAL #2   Title  Patient will increase LUE A/ROM to [facility] to increase ability to complete hair washing and other reaching overhead activities.     Time  8    Period  Weeks    Status  On-going      OT LONG TERM GOAL #3   Title  Patient will increase LUE strength to 4+/5 to increase ability to return to normal lifting activities at home.    Time  8    Period  Weeks  Status  On-going      OT LONG TERM GOAL #4   Title  Patient will decrease pain level in LUE during daily task to approximately 2 or 3/10.    Time  8    Period  Weeks    Status  On-going      OT LONG TERM GOAL #5   Title  Patient will decrease fascial restrictions to min amount or less in LUE in order to increase functional mobility needed to complete overhead reaching  tasks.     Time  8    Period  Weeks    Status  On-going            Plan - 04/17/17 1005    Clinical Impression Statement  A: Initiated myofasical release, manual stretching, P/ROM, A/ROM and pulleys. Pt made tremendous improvement with her passive and active ROM this session. Progressed to A/ROM and upgrade HEP. patient did become fatigued towards end of session and more rest breaks were needed. VC for form and technique as needed.     Plan  P: Follow up on HEP. Continue with A/ROM. Add therapy ball circles.     Consulted and Agree with Plan of Care  Patient       Patient will benefit from skilled therapeutic intervention in order to improve the following deficits and impairments:  Decreased activity tolerance, Decreased strength, Decreased range of motion, Pain, Impaired UE functional use, Increased fascial restrictions  Visit Diagnosis: Other symptoms and signs involving the musculoskeletal system  Chronic left shoulder pain  Stiffness of left shoulder, not elsewhere classified    Problem List Patient Active Problem List   Diagnosis Date Noted  . Hx of colonic polyps 10/30/2010  . GERD (gastroesophageal reflux disease) 10/30/2010  . Constipation 10/30/2010   Limmie Patricia, OTR/L,CBIS  [phone]  04/17/2017, 10:08 AM  [facility] [address] [place], [place], [address] Phone: [phone]   Fax:  [phone]  Name: Barbara [name] MRN: [id number] Date of Birth: Nov 29, 1945

## 2017-04-22 ENCOUNTER — Telehealth (HOSPITAL_COMMUNITY): Payer: Self-pay

## 2017-04-22 NOTE — Telephone Encounter (Signed)
Patient is unable to take the Friday appt

## 2017-04-23 ENCOUNTER — Other Ambulatory Visit: Payer: Self-pay

## 2017-04-23 ENCOUNTER — Ambulatory Visit (HOSPITAL_COMMUNITY): Payer: Medicare Other

## 2017-04-23 ENCOUNTER — Encounter (HOSPITAL_COMMUNITY): Payer: Self-pay

## 2017-04-23 DIAGNOSIS — R29898 Other symptoms and signs involving the musculoskeletal system: Secondary | ICD-10-CM

## 2017-04-23 DIAGNOSIS — M25512 Pain in left shoulder: Secondary | ICD-10-CM

## 2017-04-23 DIAGNOSIS — G8929 Other chronic pain: Secondary | ICD-10-CM

## 2017-04-23 DIAGNOSIS — M25612 Stiffness of left shoulder, not elsewhere classified: Secondary | ICD-10-CM

## 2017-04-23 NOTE — Therapy (Signed)
[facility] [address] [place], [place], [address] Phone: [phone]   Fax:  [phone]  Occupational Therapy Treatment  Patient Details  Name: Barbara [name] MRN: [id number] Date of Birth: 06-27-1945 Referring Provider: Dominica Severin, MD   Encounter Date: 04/23/2017  OT End of Session - 04/23/17 0852    Visit Number  3    Number of Visits  16    Date for OT Re-Evaluation  06/13/17 mini reassess; 05/12/17    Authorization Type  [employer] Medicare $20 co-pay no visit limit. Based on medical necessity.    OT Start Time  0820    OT Stop Time  0900    OT Time Calculation (min)  40 min    Activity Tolerance  Patient tolerated treatment well    Behavior During Therapy  WFL for tasks assessed/performed       Past Medical History:  Diagnosis Date  . Anxiety   . Asthma   . Bladder spasms   . DDD (degenerative disc disease)   . GERD (gastroesophageal reflux disease)   . Hypercholesteremia   . Sciatic nerve disease   . Sleep apnea     Past Surgical History:  Procedure Laterality Date  . ABDOMINAL HYSTERECTOMY    . BREAST BIOPSY     "precancer"-left  . CATARACT EXTRACTION W/PHACO Left 08/04/2016   Procedure: CATARACT EXTRACTION PHACO AND INTRAOCULAR LENS PLACEMENT (IOC);  Surgeon: Gemma Payor, MD;  Location: [facility];  Service: Ophthalmology;  Laterality: Left;  CDE: 5.48  . CATARACT EXTRACTION W/PHACO Right 08/18/2016   Procedure: CATARACT EXTRACTION PHACO AND INTRAOCULAR LENS PLACEMENT RIGHT EYE;  Surgeon: Gemma Payor, MD;  Location: [facility];  Service: Ophthalmology;  Laterality: Right;  CDE: 8.30   . COLONOSCOPY     12 years ago, Dr. Jena Gauss, polyp  . complete hysterectomy  2000   fibroids  . TONSILLECTOMY    . TUBAL LIGATION      There were no vitals filed for this visit.  Subjective Assessment - 04/23/17 0831    Subjective   S: I bought a stationary bike.    Currently in Pain?  Yes    Pain Score  3     Pain Location  Shoulder     Pain Orientation  Left    Pain Descriptors / Indicators  Aching;Sore    Pain Type  Chronic pain    Pain Radiating Towards  up to neck    Pain Onset  More than a month ago    Pain Frequency  Intermittent    Aggravating Factors   movement and use    Pain Relieving Factors  rest    Effect of Pain on Daily Activities  Pain stops her. Severe effect    Multiple Pain Sites  No         [facility] OT Assessment - 04/23/17 0833      Assessment   Medical Diagnosis  Left Frozen Shoulder      Precautions   Precautions  None    Precaution Comments  Gentle P/ROM. AA/ROM, A/ROM               OT Treatments/Exercises (OP) - 04/23/17 1324      Exercises   Exercises  Shoulder      Shoulder Exercises: Supine   Protraction  PROM;5 reps;AROM;15 reps    Horizontal ABduction  PROM;5 reps;AROM;15 reps    External Rotation  PROM;5 reps;AROM;15 reps    Internal Rotation  PROM;5  reps;AROM;15 reps    Flexion  PROM;5 reps;AROM;15 reps    ABduction  PROM;5 reps;AROM;15 reps      Shoulder Exercises: Standing   Protraction  AROM;15 reps    Horizontal ABduction  AROM;15 reps    External Rotation  AROM;15 reps    Internal Rotation  AROM;15 reps    Flexion  AROM;15 reps    ABduction  AROM;15 reps    Extension  Theraband;10 reps    Theraband Level (Shoulder Extension)  Level 2 (Red)    Row  Theraband;10 reps    Theraband Level (Shoulder Row)  Level 2 (Red)    Retraction  Theraband;10 reps    Theraband Level (Shoulder Retraction)  Level 2 (Red)      Shoulder Exercises: Therapy Ball   Right/Left  5 reps      Shoulder Exercises: ROM/Strengthening   UBE (Upper Arm Bike)  Level 1 2' forward 2' reverse pace: 4.0-4.5    X to V Arms  12X    Proximal Shoulder Strengthening, Supine  10X with no rest breaks    Proximal Shoulder Strengthening, Seated  10X with no rest breaks      Manual Therapy   Manual Therapy  Myofascial release    Manual therapy comments  performed separately from all other  skilled interventions    Myofascial Release  Myofascial release and manual stretching completed to left upper arm, trapezius, and scapularis region to decreased fascial restrictions and increase joint mobiility in a pain free zone.                OT Short Term Goals - 04/17/17 1007      OT SHORT TERM GOAL #1   Title  Patient will be educated and independent with HEP in order to increase functional use and mobility of her LUE.    Time  4    Period  Weeks    Status  On-going      OT SHORT TERM GOAL #2   Title  Patient will increase P/ROM to [facility] to increase ability to complete dressing and bathing tasks with less difficulty.     Time  4    Period  Weeks    Status  On-going      OT SHORT TERM GOAL #3   Title  Patient will increase LUE strength to 3+/5 to increase ability to complete lightweight household tasks using LUE.    Time  4    Period  Weeks    Status  On-going      OT SHORT TERM GOAL #4   Title  Patient will decrease pain level to 5/10 in LUE during daily tasks.     Time  4    Period  Weeks    Status  On-going      OT SHORT TERM GOAL #5   Title  Patient will decrease fascial restrictions to mod amount in left UE to increase mobility needed for functional reaching tasks.     Time  4    Period  Weeks    Status  On-going        OT Long Term Goals - 04/17/17 1007      OT LONG TERM GOAL #1   Title  Patient will return to highest level of independence using LUE as dominant extremity.     Time  8    Period  Weeks    Status  On-going      OT LONG TERM GOAL #2  Title  Patient will increase LUE A/ROM to [facility] to increase ability to complete hair washing and other reaching overhead activities.     Time  8    Period  Weeks    Status  On-going      OT LONG TERM GOAL #3   Title  Patient will increase LUE strength to 4+/5 to increase ability to return to normal lifting activities at home.    Time  8    Period  Weeks    Status  On-going      OT LONG TERM GOAL #4    Title  Patient will decrease pain level in LUE during daily task to approximately 2 or 3/10.    Time  8    Period  Weeks    Status  On-going      OT LONG TERM GOAL #5   Title  Patient will decrease fascial restrictions to min amount or less in LUE in order to increase functional mobility needed to complete overhead reaching tasks.     Time  8    Period  Weeks    Status  On-going            Plan - 04/23/17 [id number]    Clinical Impression Statement  A: Increased repetitions to 12 for A/ROM supine and standing. Added proximal shoulder strengthening, UBE bike, and scapular strengthening with theraband. VC for form and technique.     Plan  P: Attempt 1# weight for supine strengthening.      Consulted and Agree with Plan of Care  Patient       Patient will benefit from skilled therapeutic intervention in order to improve the following deficits and impairments:  Decreased activity tolerance, Decreased strength, Decreased range of motion, Pain, Impaired UE functional use, Increased fascial restrictions  Visit Diagnosis: Other symptoms and signs involving the musculoskeletal system  Chronic left shoulder pain  Stiffness of left shoulder, not elsewhere classified    Problem List Patient Active Problem List   Diagnosis Date Noted  . Hx of colonic polyps 10/30/2010  . GERD (gastroesophageal reflux disease) 10/30/2010  . Constipation 10/30/2010   Limmie PatriciaLaura Dalma Panchal, OTR/L,CBIS  [phone]  04/23/2017, 8:58 AM  [facility] [address] [place], [place], [address] Phone: [phone]   Fax:  [phone]  Name: Barbara [name] MRN: [id number] Date of Birth: 1945-08-18

## 2017-04-29 ENCOUNTER — Encounter (HOSPITAL_COMMUNITY): Payer: Self-pay

## 2017-04-29 ENCOUNTER — Ambulatory Visit (HOSPITAL_COMMUNITY): Payer: Medicare Other | Attending: Orthopedic Surgery

## 2017-04-29 DIAGNOSIS — G8929 Other chronic pain: Secondary | ICD-10-CM | POA: Diagnosis present

## 2017-04-29 DIAGNOSIS — M25512 Pain in left shoulder: Secondary | ICD-10-CM | POA: Diagnosis present

## 2017-04-29 DIAGNOSIS — R29898 Other symptoms and signs involving the musculoskeletal system: Secondary | ICD-10-CM

## 2017-04-29 DIAGNOSIS — M25612 Stiffness of left shoulder, not elsewhere classified: Secondary | ICD-10-CM | POA: Diagnosis present

## 2017-04-29 NOTE — Therapy (Signed)
[facility] [address] [place], [place], [address] Phone: [phone]   Fax:  [phone]  Occupational Therapy Treatment  Patient Details  Name: Haynes Hoehnheresa D Seibert MRN: [id number] Date of Birth: November 10, 1945 Referring Provider: Dominica SeverinWilliam Gramig, MD   Encounter Date: 04/29/2017  OT End of Session - 04/29/17 0858    Visit Number  4    Number of Visits  16    Date for OT Re-Evaluation  06/13/17 mini reassess; 05/12/17    Authorization Type  [employer] Medicare $20 co-pay no visit limit. Based on medical necessity.    OT Start Time  0820    OT Stop Time  0900    OT Time Calculation (min)  40 min    Activity Tolerance  Patient tolerated treatment well    Behavior During Therapy  WFL for tasks assessed/performed       Past Medical History:  Diagnosis Date  . Anxiety   . Asthma   . Bladder spasms   . DDD (degenerative disc disease)   . GERD (gastroesophageal reflux disease)   . Hypercholesteremia   . Sciatic nerve disease   . Sleep apnea     Past Surgical History:  Procedure Laterality Date  . ABDOMINAL HYSTERECTOMY    . BREAST BIOPSY     "precancer"-left  . CATARACT EXTRACTION W/PHACO Left 08/04/2016   Procedure: CATARACT EXTRACTION PHACO AND INTRAOCULAR LENS PLACEMENT (IOC);  Surgeon: Gemma PayorHunt, Kerry, MD;  Location: [facility];  Service: Ophthalmology;  Laterality: Left;  CDE: 5.48  . CATARACT EXTRACTION W/PHACO Right 08/18/2016   Procedure: CATARACT EXTRACTION PHACO AND INTRAOCULAR LENS PLACEMENT RIGHT EYE;  Surgeon: Gemma PayorHunt, Kerry, MD;  Location: [facility];  Service: Ophthalmology;  Laterality: Right;  CDE: 8.30   . COLONOSCOPY     12 years ago, Dr. Jena Gaussourk, polyp  . complete hysterectomy  2000   fibroids  . TONSILLECTOMY    . TUBAL LIGATION      There were no vitals filed for this visit.  Subjective Assessment - 04/29/17 0841    Subjective   S: It only hurts if I move it a certain way.    Currently in Pain?  No/denies         [facility]  Assessment - 04/29/17 0842      Assessment   Medical Diagnosis  Left Frozen Shoulder      Precautions   Precautions  None    Precaution Comments  Gentle P/ROM. AA/ROM, A/ROM               OT Treatments/Exercises (OP) - 04/29/17 [id number]      Exercises   Exercises  Shoulder      Shoulder Exercises: Supine   Protraction  PROM;5 reps;Strengthening;12 reps    Protraction Weight (lbs)  1    Horizontal ABduction  PROM;5 reps;Strengthening;12 reps    Horizontal ABduction Weight (lbs)  1    External Rotation  PROM;5 reps;Strengthening;12 reps    External Rotation Weight (lbs)  1    Internal Rotation  PROM;5 reps;Strengthening;12 reps    Internal Rotation Weight (lbs)  1    Flexion  PROM;5 reps;Strengthening;12 reps    Shoulder Flexion Weight (lbs)  1    ABduction  PROM;5 reps;Strengthening;12 reps    Shoulder ABduction Weight (lbs)  1      Shoulder Exercises: Standing   Protraction  Strengthening;10 reps    Protraction Weight (lbs)  1    Horizontal ABduction  Strengthening  Horizontal ABduction Weight (lbs)  1    External Rotation  Strengthening;10 reps    External Rotation Weight (lbs)  1    Internal Rotation  Strengthening;10 reps    Internal Rotation Weight (lbs)  1    Flexion  Strengthening;10 reps    Shoulder Flexion Weight (lbs)  1    ABduction  Strengthening;10 reps    Shoulder ABduction Weight (lbs)  1    Extension  Theraband;10 reps    Theraband Level (Shoulder Extension)  Level 2 (Red)    Row  Theraband;10 reps    Theraband Level (Shoulder Row)  Level 2 (Red)    Retraction  Theraband;10 reps    Theraband Level (Shoulder Retraction)  Level 2 (Red)      Shoulder Exercises: ROM/Strengthening   UBE (Upper Arm Bike)  Level 1 2' forward 2' reverse pace: 3.5-4.0    X to V Arms  10X with 1#    Proximal Shoulder Strengthening, Supine  12X with 1# no rest breaks    Proximal Shoulder Strengthening, Seated  10X with 1# no rest breaks      Manual Therapy   Manual  Therapy  Myofascial release    Manual therapy comments  performed separately from all other skilled interventions    Myofascial Release  Myofascial release and manual stretching completed to left upper arm, trapezius, and scapularis region to decreased fascial restrictions and increase joint mobiility in a pain free zone.                OT Short Term Goals - 04/17/17 1007      OT SHORT TERM GOAL #1   Title  Patient will be educated and independent with HEP in order to increase functional use and mobility of her LUE.    Time  4    Period  Weeks    Status  On-going      OT SHORT TERM GOAL #2   Title  Patient will increase P/ROM to [facility] to increase ability to complete dressing and bathing tasks with less difficulty.     Time  4    Period  Weeks    Status  On-going      OT SHORT TERM GOAL #3   Title  Patient will increase LUE strength to 3+/5 to increase ability to complete lightweight household tasks using LUE.    Time  4    Period  Weeks    Status  On-going      OT SHORT TERM GOAL #4   Title  Patient will decrease pain level to 5/10 in LUE during daily tasks.     Time  4    Period  Weeks    Status  On-going      OT SHORT TERM GOAL #5   Title  Patient will decrease fascial restrictions to mod amount in left UE to increase mobility needed for functional reaching tasks.     Time  4    Period  Weeks    Status  On-going        OT Long Term Goals - 04/17/17 1007      OT LONG TERM GOAL #1   Title  Patient will return to highest level of independence using LUE as dominant extremity.     Time  8    Period  Weeks    Status  On-going      OT LONG TERM GOAL #2   Title  Patient will increase LUE A/ROM to  WFL to increase ability to complete hair washing and other reaching overhead activities.     Time  8    Period  Weeks    Status  On-going      OT LONG TERM GOAL #3   Title  Patient will increase LUE strength to 4+/5 to increase ability to return to normal lifting  activities at home.    Time  8    Period  Weeks    Status  On-going      OT LONG TERM GOAL #4   Title  Patient will decrease pain level in LUE during daily task to approximately 2 or 3/10.    Time  8    Period  Weeks    Status  On-going      OT LONG TERM GOAL #5   Title  Patient will decrease fascial restrictions to min amount or less in LUE in order to increase functional mobility needed to complete overhead reaching tasks.     Time  8    Period  Weeks    Status  On-going            Plan - 04/29/17 1610    Clinical Impression Statement  A: Patient was able to progress to 1# supine and standing to increase shoulder strengthening and stability. Encouraged patient to begin strengthening at home with a 1#, a water bottle, or can of soup. VC for form and technique.    Plan  P: Add therapy ball exercises for shoulder strengthening.    Consulted and Agree with Plan of Care  Patient       Patient will benefit from skilled therapeutic intervention in order to improve the following deficits and impairments:  Decreased activity tolerance, Decreased strength, Decreased range of motion, Pain, Impaired UE functional use, Increased fascial restrictions  Visit Diagnosis: Other symptoms and signs involving the musculoskeletal system  Chronic left shoulder pain  Stiffness of left shoulder, not elsewhere classified    Problem List Patient Active Problem List   Diagnosis Date Noted  . Hx of colonic polyps 10/30/2010  . GERD (gastroesophageal reflux disease) 10/30/2010  . Constipation 10/30/2010   Limmie Patricia, OTR/L,CBIS  [phone]  04/29/2017, 9:11 AM  [facility] [address] [place], [place], [address] Phone: [phone]   Fax:  [phone]  Name: EVONNA STOLTZ MRN: [id number] Date of Birth: 08-Dec-1945

## 2017-05-01 ENCOUNTER — Ambulatory Visit (HOSPITAL_COMMUNITY): Payer: Medicare Other | Admitting: Specialist

## 2017-05-01 ENCOUNTER — Encounter (HOSPITAL_COMMUNITY): Payer: Self-pay | Admitting: Specialist

## 2017-05-01 DIAGNOSIS — M25612 Stiffness of left shoulder, not elsewhere classified: Secondary | ICD-10-CM

## 2017-05-01 DIAGNOSIS — R29898 Other symptoms and signs involving the musculoskeletal system: Secondary | ICD-10-CM

## 2017-05-01 DIAGNOSIS — M7502 Adhesive capsulitis of left shoulder: Secondary | ICD-10-CM | POA: Insufficient documentation

## 2017-05-01 DIAGNOSIS — G8929 Other chronic pain: Secondary | ICD-10-CM

## 2017-05-01 DIAGNOSIS — M25512 Pain in left shoulder: Secondary | ICD-10-CM

## 2017-05-01 NOTE — Therapy (Signed)
[facility] [address] [place], [place], [address] Phone: [phone]   Fax:  [phone]  Occupational Therapy Treatment  Patient Details  Name: Barbara [name] MRN: [id number] Date of Birth: Nov 10, 1945 Referring Provider: Dominica SeverinWilliam Gramig, MD   Encounter Date: 05/01/2017  OT End of Session - 05/01/17 1422    Visit Number  5    Number of Visits  16    Date for OT Re-Evaluation  06/13/17 mini reassess on 4/16    Authorization Type  [facility] no visit limit. Based on medical necessity.    OT Start Time  [phone]    OT Stop Time  0946    OT Time Calculation (min)  41 min    Activity Tolerance  Patient tolerated treatment well    Behavior During Therapy  [facility]       Past Medical History:  Diagnosis Date  . Anxiety   . Asthma   . Bladder spasms   . DDD (degenerative disc disease)   . GERD (gastroesophageal reflux disease)   . Hypercholesteremia   . Sciatic nerve disease   . Sleep apnea     Past Surgical History:  Procedure Laterality Date  . ABDOMINAL HYSTERECTOMY    . BREAST BIOPSY     "precancer"-left  . CATARACT EXTRACTION W/PHACO Left 08/04/2016   Procedure: CATARACT EXTRACTION PHACO AND INTRAOCULAR LENS PLACEMENT (IOC);  Surgeon: Gemma PayorHunt, Kerry, MD;  Location: [facility];  Service: Ophthalmology;  Laterality: Left;  CDE: 5.48  . CATARACT EXTRACTION W/PHACO Right 08/18/2016   Procedure: CATARACT EXTRACTION PHACO AND INTRAOCULAR LENS PLACEMENT RIGHT EYE;  Surgeon: Gemma PayorHunt, Kerry, MD;  Location: [facility];  Service: Ophthalmology;  Laterality: Right;  CDE: 8.30   . COLONOSCOPY     12 years ago, Dr. Jena Gaussourk, polyp  . complete hysterectomy  2000   fibroids  . TONSILLECTOMY    . TUBAL LIGATION      There were no vitals filed for this visit.  Subjective Assessment - 05/01/17 1421    Subjective   S:  Its ok today.      Currently in Pain?  Yes    Pain Score  2     Pain Location  Shoulder    Pain  Orientation  Left    Pain Descriptors / Indicators  Aching    Pain Type  Acute pain    Pain Onset  More than a month ago    Pain Frequency  Intermittent         [facility] OT Assessment - 05/01/17 0001      Assessment   Medical Diagnosis  Left Frozen Shoulder      Precautions   Precautions  None    Precaution Comments  Gentle P/ROM. AA/ROM, A/ROM               OT Treatments/Exercises (OP) - 05/01/17 0001      Exercises   Exercises  Shoulder      Shoulder Exercises: Supine   Protraction  PROM;5 reps;Strengthening;15 reps    Protraction Weight (lbs)  1    Horizontal ABduction  PROM;5 reps;Strengthening;15 reps    Horizontal ABduction Weight (lbs)  1    External Rotation  PROM;5 reps;Strengthening;15 reps    External Rotation Weight (lbs)  1    Internal Rotation  PROM;5 reps;Strengthening;15 reps    Internal Rotation Weight (lbs)  1    Flexion  PROM;5 reps;Strengthening;15 reps    Shoulder  Flexion Weight (lbs)  1    ABduction  PROM;5 reps;Strengthening;15 reps    Shoulder ABduction Weight (lbs)  1      Shoulder Exercises: Standing   Protraction  Strengthening;12 reps    Protraction Weight (lbs)  1    Horizontal ABduction  Strengthening;12 reps    Horizontal ABduction Weight (lbs)  1    External Rotation  Strengthening;12 reps    External Rotation Weight (lbs)  1    Internal Rotation  Strengthening;12 reps    Internal Rotation Weight (lbs)  1    Flexion  Strengthening;12 reps    Shoulder Flexion Weight (lbs)  1    ABduction  Strengthening;12 reps    Shoulder ABduction Weight (lbs)  1    Extension  Theraband;15 reps    Theraband Level (Shoulder Extension)  Level 2 (Red)    Row  Theraband;15 reps    Theraband Level (Shoulder Row)  Level 2 (Red)    Retraction  Theraband;15 reps    Theraband Level (Shoulder Retraction)  Level 2 (Red)      Shoulder Exercises: ROM/Strengthening   UBE (Upper Arm Bike)  level 2.0 3' forward and 3' reverse at 4.5 pace     Other  ROM/Strengthening Exercises  therapy ball chest press, flexion, overhead press, overhead v 10 X each       Manual Therapy   Manual Therapy  Soft tissue mobilization    Manual therapy comments  performed separately from all other skilled interventions    Soft tissue mobilization  manual therapy and soft tissue mobilization to left upper arm, scapular region and shoulder region to decrease tightness and pain that is limiting end range mobility needed for ADL completion                OT Short Term Goals - 04/17/17 1007      OT SHORT TERM GOAL #1   Title  Patient will be educated and independent with HEP in order to increase functional use and mobility of her LUE.    Time  4    Period  Weeks    Status  On-going      OT SHORT TERM GOAL #2   Title  Patient will increase P/ROM to [facility] to increase ability to complete dressing and bathing tasks with less difficulty.     Time  4    Period  Weeks    Status  On-going      OT SHORT TERM GOAL #3   Title  Patient will increase LUE strength to 3+/5 to increase ability to complete lightweight household tasks using LUE.    Time  4    Period  Weeks    Status  On-going      OT SHORT TERM GOAL #4   Title  Patient will decrease pain level to 5/10 in LUE during daily tasks.     Time  4    Period  Weeks    Status  On-going      OT SHORT TERM GOAL #5   Title  Patient will decrease fascial restrictions to mod amount in left UE to increase mobility needed for functional reaching tasks.     Time  4    Period  Weeks    Status  On-going        OT Long Term Goals - 04/17/17 1007      OT LONG TERM GOAL #1   Title  Patient will return to highest level of independence  using LUE as dominant extremity.     Time  8    Period  Weeks    Status  On-going      OT LONG TERM GOAL #2   Title  Patient will increase LUE A/ROM to [facility] to increase ability to complete hair washing and other reaching overhead activities.     Time  8    Period  Weeks     Status  On-going      OT LONG TERM GOAL #3   Title  Patient will increase LUE strength to 4+/5 to increase ability to return to normal lifting activities at home.    Time  8    Period  Weeks    Status  On-going      OT LONG TERM GOAL #4   Title  Patient will decrease pain level in LUE during daily task to approximately 2 or 3/10.    Time  8    Period  Weeks    Status  On-going      OT LONG TERM GOAL #5   Title  Patient will decrease fascial restrictions to min amount or less in LUE in order to increase functional mobility needed to complete overhead reaching tasks.     Time  8    Period  Weeks    Status  On-going            Plan - 05/01/17 1423    Clinical Impression Statement  A:  Patient continues to have pain and limited end range movement, therefore manual therapy continued.  Able to complete full range after manual therapy intervention.  increased to 15 repetitions with strengthening exercises this date. Completed overhead activities using therapy ball with min-mod difficulty.     Plan  P:  Attempt cybex press and row for proximal shoulder strengthening, continue manual therapy for improved end range mobiilty and decreased pain.        Patient will benefit from skilled therapeutic intervention in order to improve the following deficits and impairments:  Decreased activity tolerance, Decreased strength, Decreased range of motion, Pain, Impaired UE functional use, Increased fascial restrictions  Visit Diagnosis: Other symptoms and signs involving the musculoskeletal system  Chronic left shoulder pain  Stiffness of left shoulder, not elsewhere classified    Problem List Patient Active Problem List   Diagnosis Date Noted  . Hx of colonic polyps 10/30/2010  . GERD (gastroesophageal reflux disease) 10/30/2010  . Constipation 10/30/2010    Shirlean Mylar, MHA, OTR/L [phone]  05/01/2017, 2:31 PM  [facility] [address] [place], [place], [address] Phone: [phone]   Fax:  [phone]  Name: Barbara [name] MRN: [id number] Date of Birth: 13-Jul-1945

## 2017-05-05 ENCOUNTER — Encounter (HOSPITAL_COMMUNITY): Payer: Self-pay

## 2017-05-05 ENCOUNTER — Ambulatory Visit (HOSPITAL_COMMUNITY): Payer: Medicare Other

## 2017-05-05 ENCOUNTER — Other Ambulatory Visit: Payer: Self-pay

## 2017-05-05 DIAGNOSIS — M25512 Pain in left shoulder: Secondary | ICD-10-CM

## 2017-05-05 DIAGNOSIS — M25612 Stiffness of left shoulder, not elsewhere classified: Secondary | ICD-10-CM

## 2017-05-05 DIAGNOSIS — G8929 Other chronic pain: Secondary | ICD-10-CM

## 2017-05-05 DIAGNOSIS — R29898 Other symptoms and signs involving the musculoskeletal system: Secondary | ICD-10-CM

## 2017-05-05 NOTE — Therapy (Signed)
[facility] [address] [place], [place], [address] Phone: [phone]   Fax:  [phone]  Occupational Therapy Treatment  Patient Details  Name: Barbara [name] MRN: [id number] Date of Birth: 1945/02/27 Referring Provider: Dominica Severin, MD   Encounter Date: 05/05/2017  OT End of Session - 05/05/17 0945    Visit Number  6    Number of Visits  16    Date for OT Re-Evaluation  06/13/17    Authorization Type  [employer] Medicare $20 co-pay no visit limit. Based on medical necessity.    OT Start Time  [phone]    OT Stop Time  0945    OT Time Calculation (min)  40 min    Activity Tolerance  Patient tolerated treatment well    Behavior During Therapy  [facility] for tasks assessed/performed       Past Medical History:  Diagnosis Date  . Anxiety   . Asthma   . Bladder spasms   . DDD (degenerative disc disease)   . GERD (gastroesophageal reflux disease)   . Hypercholesteremia   . Sciatic nerve disease   . Sleep apnea     Past Surgical History:  Procedure Laterality Date  . ABDOMINAL HYSTERECTOMY    . BREAST BIOPSY     "precancer"-left  . CATARACT EXTRACTION W/PHACO Left 08/04/2016   Procedure: CATARACT EXTRACTION PHACO AND INTRAOCULAR LENS PLACEMENT (IOC);  Surgeon: Gemma Payor, MD;  Location: [facility];  Service: Ophthalmology;  Laterality: Left;  CDE: 5.48  . CATARACT EXTRACTION W/PHACO Right 08/18/2016   Procedure: CATARACT EXTRACTION PHACO AND INTRAOCULAR LENS PLACEMENT RIGHT EYE;  Surgeon: Gemma Payor, MD;  Location: [facility];  Service: Ophthalmology;  Laterality: Right;  CDE: 8.30   . COLONOSCOPY     12 years ago, Dr. Jena Gauss, polyp  . complete hysterectomy  2000   fibroids  . TONSILLECTOMY    . TUBAL LIGATION      There were no vitals filed for this visit.      [facility] OT Assessment - 05/05/17 0906      Assessment   Medical Diagnosis  Left Frozen Shoulder    Onset Date/Surgical Date  09/27/16      Precautions   Precautions  None     Precaution Comments  Gentle P/ROM. AA/ROM, A/ROM      Prior Function   Level of Independence  Independent      AROM   Overall AROM Comments  Assessed seated. IR/er adducted on eval and abducted today.    AROM Assessment Site  Shoulder    Right/Left Shoulder  Left    Left Shoulder Flexion  157 Degrees previous: 106    Left Shoulder ABduction  155 Degrees previous: 79    Left Shoulder Internal Rotation  60 Degrees previous: 90 adducted    Left Shoulder External Rotation  80 Degrees previous: 65 adducted      PROM   Overall PROM Comments  Assessed supine. IR/er adducted on eval and abducted today.     PROM Assessment Site  Shoulder    Right/Left Shoulder  Left    Left Shoulder Flexion  180 Degrees 106    Left Shoulder ABduction  180 Degrees previous: 70    Left Shoulder Internal Rotation  90 Degrees previous: 90 adducted    Left Shoulder External Rotation  90 Degrees previous: 70 adducted      Strength   Overall Strength Comments  Assessed seated. IR/er adducted.    Strength Assessment  Site  Shoulder    Right/Left Shoulder  Left    Left Shoulder Flexion  3+/5 previous: 3-/5    Left Shoulder Extension  --    Left Shoulder ABduction  4/5 previous: 3-/5    Left Shoulder Internal Rotation  5/5 previous: 5/5    Left Shoulder External Rotation  4+/5 previous: 4/5               OT Treatments/Exercises (OP) - 05/05/17 0916      Exercises   Exercises  Shoulder      Shoulder Exercises: Supine   Protraction  PROM;5 reps;Strengthening;10 reps    Protraction Weight (lbs)  2    Horizontal ABduction  PROM;5 reps;Strengthening;10 reps    Horizontal ABduction Weight (lbs)  2    External Rotation  PROM;5 reps;Strengthening;10 reps    External Rotation Weight (lbs)  2    Internal Rotation  PROM;5 reps;Strengthening;10 reps    Internal Rotation Limitations  2    Flexion  PROM;5 reps;Strengthening;10 reps    Shoulder Flexion Weight (lbs)  2    ABduction  PROM;5  reps;Strengthening;10 reps    Shoulder ABduction Weight (lbs)  2      Shoulder Exercises: Standing   Protraction  Strengthening;15 reps    Protraction Weight (lbs)  1    Horizontal ABduction  Strengthening;12 reps    Horizontal ABduction Weight (lbs)  1    External Rotation  Strengthening;15 reps    External Rotation Weight (lbs)  1    Internal Rotation  Strengthening;15 reps    Internal Rotation Weight (lbs)  1    Flexion  Strengthening;12 reps    Shoulder Flexion Weight (lbs)  1    ABduction  Strengthening;15 reps    Shoulder ABduction Weight (lbs)  1      Shoulder Exercises: ROM/Strengthening   UBE (Upper Arm Bike)  Level 2 2' forward 2' reverse  pace: 3.5-4.0    Proximal Shoulder Strengthening, Supine  10X with 2# no rest breaks    Proximal Shoulder Strengthening, Seated  12X with 1# no rest breaks      Manual Therapy   Manual Therapy  Myofascial release    Manual therapy comments  performed separately from all other skilled interventions    Myofascial Release  Myofascial release and manual stretching completed to left upper arm, trapezius, and scapularis region to decreased fascial restrictions and increase joint mobiility in a pain free zone.                OT Short Term Goals - 04/17/17 1007      OT SHORT TERM GOAL #1   Title  Patient will be educated and independent with HEP in order to increase functional use and mobility of her LUE.    Time  4    Period  Weeks    Status  On-going      OT SHORT TERM GOAL #2   Title  Patient will increase P/ROM to [facility] to increase ability to complete dressing and bathing tasks with less difficulty.     Time  4    Period  Weeks    Status  On-going      OT SHORT TERM GOAL #3   Title  Patient will increase LUE strength to 3+/5 to increase ability to complete lightweight household tasks using LUE.    Time  4    Period  Weeks    Status  On-going      OT SHORT  TERM GOAL #4   Title  Patient will decrease pain level to 5/10 in  LUE during daily tasks.     Time  4    Period  Weeks    Status  On-going      OT SHORT TERM GOAL #5   Title  Patient will decrease fascial restrictions to mod amount in left UE to increase mobility needed for functional reaching tasks.     Time  4    Period  Weeks    Status  On-going        OT Long Term Goals - 04/17/17 1007      OT LONG TERM GOAL #1   Title  Patient will return to highest level of independence using LUE as dominant extremity.     Time  8    Period  Weeks    Status  On-going      OT LONG TERM GOAL #2   Title  Patient will increase LUE A/ROM to [facility] to increase ability to complete hair washing and other reaching overhead activities.     Time  8    Period  Weeks    Status  On-going      OT LONG TERM GOAL #3   Title  Patient will increase LUE strength to 4+/5 to increase ability to return to normal lifting activities at home.    Time  8    Period  Weeks    Status  On-going      OT LONG TERM GOAL #4   Title  Patient will decrease pain level in LUE during daily task to approximately 2 or 3/10.    Time  8    Period  Weeks    Status  On-going      OT LONG TERM GOAL #5   Title  Patient will decrease fascial restrictions to min amount or less in LUE in order to increase functional mobility needed to complete overhead reaching tasks.     Time  8    Period  Weeks    Status  On-going            Plan - 05/05/17 0946    Clinical Impression Statement  A: Patient with min fascial restrictions in anterior deltoid region. Measurements and strength tested for MD appointment. patient with almost full ROM passive and active. Strength and fascial restrictions continue to be a deficit. Pt provided with print out of measurements to task to MD appointment. VC for form and technique during session as patient was able to progress to 2# supine for strengthening.     Plan  P: Follow up on MD appointment. Attempt Cybex row and press. Continue manual therapy for improved range  of motion and decreased pain.        Patient will benefit from skilled therapeutic intervention in order to improve the following deficits and impairments:  Decreased activity tolerance, Decreased strength, Decreased range of motion, Pain, Impaired UE functional use, Increased fascial restrictions  Visit Diagnosis: Other symptoms and signs involving the musculoskeletal system  Chronic left shoulder pain  Stiffness of left shoulder, not elsewhere classified    Problem List Patient Active Problem List   Diagnosis Date Noted  . Hx of colonic polyps 10/30/2010  . GERD (gastroesophageal reflux disease) 10/30/2010  . Constipation 10/30/2010   Limmie Patricia, OTR/L,CBIS  [phone]  05/05/2017, 10:14 AM  [facility] [address] [place], [place], [address] Phone: [phone]   Fax:  [phone]  Name: Haynes Hoehnheresa D Hartland MRN: [id number] Date of Birth: 09-26-1945

## 2017-05-07 ENCOUNTER — Encounter (HOSPITAL_COMMUNITY): Payer: Medicare Other

## 2017-05-12 ENCOUNTER — Other Ambulatory Visit: Payer: Self-pay

## 2017-05-12 ENCOUNTER — Ambulatory Visit (HOSPITAL_COMMUNITY): Payer: Medicare Other

## 2017-05-12 ENCOUNTER — Encounter (HOSPITAL_COMMUNITY): Payer: Self-pay

## 2017-05-12 DIAGNOSIS — G8929 Other chronic pain: Secondary | ICD-10-CM

## 2017-05-12 DIAGNOSIS — M25612 Stiffness of left shoulder, not elsewhere classified: Secondary | ICD-10-CM

## 2017-05-12 DIAGNOSIS — M25512 Pain in left shoulder: Secondary | ICD-10-CM

## 2017-05-12 DIAGNOSIS — R29898 Other symptoms and signs involving the musculoskeletal system: Secondary | ICD-10-CM | POA: Diagnosis not present

## 2017-05-12 NOTE — Therapy (Signed)
[facility] [address] [place], [place], [address] Phone: [phone]   Fax:  [phone]  Occupational Therapy Treatment  Patient Details  Name: Barbara [name] MRN: [id number] Date of Birth: [birth year] Referring Provider: Dominica SeverinWilliam Gramig, MD   Encounter Date: 05/12/2017  OT End of Session - 05/12/17 0854    Visit Number  7    Number of Visits  16    Date for OT Re-Evaluation  06/13/17    Authorization Type  [employer] Medicare $20 co-pay no visit limit. Based on medical necessity.    OT Start Time  0820    OT Stop Time  0900    OT Time Calculation (min)  40 min    Activity Tolerance  Patient tolerated treatment well    Behavior During Therapy  [facility]FL for tasks assessed/performed       Past Medical History:  Diagnosis Date  . Anxiety   . Asthma   . Bladder spasms   . DDD (degenerative disc disease)   . GERD (gastroesophageal reflux disease)   . Hypercholesteremia   . Sciatic nerve disease   . Sleep apnea     Past Surgical History:  Procedure Laterality Date  . ABDOMINAL HYSTERECTOMY    . BREAST BIOPSY     "precancer"-left  . CATARACT EXTRACTION W/PHACO Left 08/04/2016   Procedure: CATARACT EXTRACTION PHACO AND INTRAOCULAR LENS PLACEMENT (IOC);  Surgeon: Gemma PayorHunt, Kerry, MD;  Location: [facility];  Service: Ophthalmology;  Laterality: Left;  CDE: 5.48  . CATARACT EXTRACTION W/PHACO Right 08/18/2016   Procedure: CATARACT EXTRACTION PHACO AND INTRAOCULAR LENS PLACEMENT RIGHT EYE;  Surgeon: Gemma PayorHunt, Kerry, MD;  Location: [facility];  Service: Ophthalmology;  Laterality: Right;  CDE: 8.30   . COLONOSCOPY     12 years ago, Dr. Jena Gaussourk, polyp  . complete hysterectomy  2000   fibroids  . TONSILLECTOMY    . TUBAL LIGATION      There were no vitals filed for this visit.  Subjective Assessment - 05/12/17 0837    Subjective   S: The doctor was very pleased and he said to keep doing therapy.    Currently in Pain?  No/denies         [facility]  [facility] - 05/12/17 0839      Assessment   Medical Diagnosis  Left Frozen Shoulder      Precautions   Precautions  None    Precaution Comments  Gentle P/ROM. AA/ROM, A/ROM               OT Treatments/Exercises (OP) - 05/12/17 0840      Exercises   Exercises  Shoulder      Shoulder Exercises: Supine   Protraction  PROM;5 reps;Strengthening;10 reps    Protraction Weight (lbs)  2    Horizontal ABduction  PROM;5 reps;Strengthening;10 reps    Horizontal ABduction Weight (lbs)  2    External Rotation  PROM;5 reps;Strengthening;10 reps    External Rotation Weight (lbs)  2    Internal Rotation  PROM;5 reps;Strengthening;10 reps    Internal Rotation Weight (lbs)  2    Flexion  PROM;5 reps;Strengthening;10 reps    Shoulder Flexion Weight (lbs)  2    ABduction  PROM;5 reps;Strengthening;10 reps    Shoulder ABduction Weight (lbs)  2      Shoulder Exercises: Sidelying   Other Sidelying Exercises  Protraction; 10X; 2#    Other Sidelying Exercises  Horizontal abduction; 10X; 2#  Shoulder Exercises: Standing   Protraction  Strengthening;10 reps    Protraction Weight (lbs)  2    External Rotation  Strengthening;10 reps    External Rotation Weight (lbs)  2    Internal Rotation  Strengthening;10 reps    Internal Rotation Weight (lbs)  2    Flexion  Strengthening;10 reps    Shoulder Flexion Weight (lbs)  2    ABduction  Strengthening;10 reps    Shoulder ABduction Weight (lbs)  2      Shoulder Exercises: ROM/Strengthening   Proximal Shoulder Strengthening, Supine  10X with 2# no rest breaks      Manual Therapy   Manual Therapy  Myofascial release    Manual therapy comments  performed separately from all other skilled interventions    Myofascial Release  Myofascial release and manual stretching completed to left upper arm, trapezius, and scapularis region to decreased fascial restrictions and increase joint mobiility in a pain free zone.                OT Short  Term Goals - 04/17/17 1007      OT SHORT TERM GOAL #1   Title  Patient will be educated and independent with HEP in order to increase functional use and mobility of her LUE.    Time  4    Period  Weeks    Status  On-going      OT SHORT TERM GOAL #2   Title  Patient will increase P/ROM to [facility] to increase ability to complete dressing and bathing tasks with less difficulty.     Time  4    Period  Weeks    Status  On-going      OT SHORT TERM GOAL #3   Title  Patient will increase LUE strength to 3+/5 to increase ability to complete lightweight household tasks using LUE.    Time  4    Period  Weeks    Status  On-going      OT SHORT TERM GOAL #4   Title  Patient will decrease pain level to 5/10 in LUE during daily tasks.     Time  4    Period  Weeks    Status  On-going      OT SHORT TERM GOAL #5   Title  Patient will decrease fascial restrictions to mod amount in left UE to increase mobility needed for functional reaching tasks.     Time  4    Period  Weeks    Status  On-going        OT Long Term Goals - 04/17/17 1007      OT LONG TERM GOAL #1   Title  Patient will return to highest level of independence using LUE as dominant extremity.     Time  8    Period  Weeks    Status  On-going      OT LONG TERM GOAL #2   Title  Patient will increase LUE A/ROM to [facility] to increase ability to complete hair washing and other reaching overhead activities.     Time  8    Period  Weeks    Status  On-going      OT LONG TERM GOAL #3   Title  Patient will increase LUE strength to 4+/5 to increase ability to return to normal lifting activities at home.    Time  8    Period  Weeks    Status  On-going  OT LONG TERM GOAL #4   Title  Patient will decrease pain level in LUE during daily task to approximately 2 or 3/10.    Time  8    Period  Weeks    Status  On-going      OT LONG TERM GOAL #5   Title  Patient will decrease fascial restrictions to min amount or less in LUE in order to  increase functional mobility needed to complete overhead reaching tasks.     Time  8    Period  Weeks    Status  On-going            Plan - 05/12/17 1610    Clinical Impression Statement  A: Pt with min fascial restrictions in anterior deltoid region with manual therapy completed at beginning of session. Patient was able to complete all strengthening with 2#. VC for form and technique.    Plan  P: Add ball on the wall and cybex row. Continue with manual therapy for improved ROM and decreased pain.    Consulted and Agree with Plan of Care  Patient       Patient will benefit from skilled therapeutic intervention in order to improve the following deficits and impairments:  Decreased activity tolerance, Decreased strength, Decreased range of motion, Pain, Impaired UE functional use, Increased fascial restrictions  Visit Diagnosis: Other symptoms and signs involving the musculoskeletal system  Chronic left shoulder pain  Stiffness of left shoulder, not elsewhere classified    Problem List Patient Active Problem List   Diagnosis Date Noted  . Hx of colonic polyps 10/30/2010  . GERD (gastroesophageal reflux disease) 10/30/2010  . Constipation 10/30/2010   Limmie Patricia, OTR/L,CBIS  [phone]  05/12/2017, 9:26 AM  [facility] [address] [place], [place], [address] Phone: [phone]   Fax:  [phone]  Name: Barbara [name] MRN: [id number] Date of Birth: [birth year]

## 2017-05-15 ENCOUNTER — Encounter (HOSPITAL_COMMUNITY): Payer: Self-pay

## 2017-05-15 ENCOUNTER — Ambulatory Visit (HOSPITAL_COMMUNITY): Payer: Medicare Other

## 2017-05-15 ENCOUNTER — Telehealth (HOSPITAL_COMMUNITY): Payer: Self-pay | Admitting: Family Medicine

## 2017-05-15 ENCOUNTER — Other Ambulatory Visit: Payer: Self-pay

## 2017-05-15 DIAGNOSIS — M25612 Stiffness of left shoulder, not elsewhere classified: Secondary | ICD-10-CM

## 2017-05-15 DIAGNOSIS — R29898 Other symptoms and signs involving the musculoskeletal system: Secondary | ICD-10-CM

## 2017-05-15 DIAGNOSIS — G8929 Other chronic pain: Secondary | ICD-10-CM

## 2017-05-15 DIAGNOSIS — M25512 Pain in left shoulder: Secondary | ICD-10-CM

## 2017-05-15 NOTE — Therapy (Signed)
[name] [address] [place], [place], [address] Phone: [phone]   Fax:  [phone]  Occupational Therapy Treatment  Patient Details  Name: Barbara [name] MRN: [id number] Date of Birth: 10/10/1945 Referring Provider: Dominica Severin, MD   Encounter Date: 05/15/2017  OT End of Session - 05/15/17 0839    Visit Number  8    Number of Visits  16    Date for OT Re-Evaluation  06/13/17    Authorization Type  [employer] Medicare $20 co-pay no visit limit. Based on medical necessity.    OT Start Time  0820    OT Stop Time  0900    OT Time Calculation (min)  40 min    Activity Tolerance  Patient tolerated treatment well    Behavior During Therapy  WFL for tasks assessed/performed       Past Medical History:  Diagnosis Date  . Anxiety   . Asthma   . Bladder spasms   . DDD (degenerative disc disease)   . GERD (gastroesophageal reflux disease)   . Hypercholesteremia   . Sciatic nerve disease   . Sleep apnea     Past Surgical History:  Procedure Laterality Date  . ABDOMINAL HYSTERECTOMY    . BREAST BIOPSY     "precancer"-left  . CATARACT EXTRACTION W/PHACO Left 08/04/2016   Procedure: CATARACT EXTRACTION PHACO AND INTRAOCULAR LENS PLACEMENT (IOC);  Surgeon: Gemma Payor, MD;  Location: [facility];  Service: Ophthalmology;  Laterality: Left;  CDE: 5.48  . CATARACT EXTRACTION W/PHACO Right 08/18/2016   Procedure: CATARACT EXTRACTION PHACO AND INTRAOCULAR LENS PLACEMENT RIGHT EYE;  Surgeon: Gemma Payor, MD;  Location: [facility];  Service: Ophthalmology;  Laterality: Right;  CDE: 8.30   . COLONOSCOPY     12 years ago, Dr. Jena Gauss, polyp  . complete hysterectomy  2000   fibroids  . TONSILLECTOMY    . TUBAL LIGATION      There were no vitals filed for this visit.  Subjective Assessment - 05/15/17 0837    Subjective   S: It just hurts here and there depending on how i'm moving it.     Currently in Pain?  No/denies         [facility] OT  Assessment - 05/15/17 0838      Assessment   Medical Diagnosis  Left Frozen Shoulder      Precautions   Precautions  None    Precaution Comments  Gentle P/ROM. AA/ROM, A/ROM               OT Treatments/Exercises (OP) - 05/15/17 1324      Exercises   Exercises  Shoulder      Shoulder Exercises: Supine   Protraction  PROM;5 reps;Strengthening;10 reps    Protraction Weight (lbs)  2    Horizontal ABduction  PROM;5 reps;Strengthening;10 reps    Horizontal ABduction Weight (lbs)  2    External Rotation  PROM;5 reps;Strengthening;10 reps    External Rotation Weight (lbs)  2    Internal Rotation  PROM;5 reps;Strengthening;10 reps    Internal Rotation Weight (lbs)  2    Flexion  PROM;5 reps;Strengthening;10 reps    Shoulder Flexion Weight (lbs)  2    ABduction  PROM;5 reps;Strengthening;10 reps    Shoulder ABduction Weight (lbs)  2      Shoulder Exercises: Sidelying   Other Sidelying Exercises  Protraction; 10X; 2#    Other Sidelying Exercises  Horizontal abduction; 10X; 2#  Shoulder Exercises: ROM/Strengthening   Cybex Press  1.5 plate;10 reps    Cybex Row  1.5 plate;10 reps    Proximal Shoulder Strengthening, Supine  10X with 2# no rest breaks    Ball on Wall  1'      Manual Therapy   Manual Therapy  Myofascial release    Manual therapy comments  performed separately from all other skilled interventions    Myofascial Release  Myofascial release and manual stretching completed to left upper arm, trapezius, and scapularis region to decreased fascial restrictions and increase joint mobiility in a pain free zone.                OT Short Term Goals - 04/17/17 1007      OT SHORT TERM GOAL #1   Title  Patient will be educated and independent with HEP in order to increase functional use and mobility of her LUE.    Time  4    Period  Weeks    Status  On-going      OT SHORT TERM GOAL #2   Title  Patient will increase P/ROM to [facility] to increase ability to  complete dressing and bathing tasks with less difficulty.     Time  4    Period  Weeks    Status  On-going      OT SHORT TERM GOAL #3   Title  Patient will increase LUE strength to 3+/5 to increase ability to complete lightweight household tasks using LUE.    Time  4    Period  Weeks    Status  On-going      OT SHORT TERM GOAL #4   Title  Patient will decrease pain level to 5/10 in LUE during daily tasks.     Time  4    Period  Weeks    Status  On-going      OT SHORT TERM GOAL #5   Title  Patient will decrease fascial restrictions to mod amount in left UE to increase mobility needed for functional reaching tasks.     Time  4    Period  Weeks    Status  On-going        OT Long Term Goals - 04/17/17 1007      OT LONG TERM GOAL #1   Title  Patient will return to highest level of independence using LUE as dominant extremity.     Time  8    Period  Weeks    Status  On-going      OT LONG TERM GOAL #2   Title  Patient will increase LUE A/ROM to [facility] to increase ability to complete hair washing and other reaching overhead activities.     Time  8    Period  Weeks    Status  On-going      OT LONG TERM GOAL #3   Title  Patient will increase LUE strength to 4+/5 to increase ability to return to normal lifting activities at home.    Time  8    Period  Weeks    Status  On-going      OT LONG TERM GOAL #4   Title  Patient will decrease pain level in LUE during daily task to approximately 2 or 3/10.    Time  8    Period  Weeks    Status  On-going      OT LONG TERM GOAL #5   Title  Patient will decrease  fascial restrictions to min amount or less in LUE in order to increase functional mobility needed to complete overhead reaching tasks.     Time  8    Period  Weeks    Status  On-going            Plan - 05/15/17 [id number]    Clinical Impression Statement  A: Continued with strengthening of left shoulder. Added ball on the wall and cybex row for shoulder stability and strength.  Manual therapy completed to address trigger points and muscle tightness. VC for form and technique.    Plan  P: Continue with manual therapy to improve ROM and decrease pain. Continue with strengthening. Add more side lying strenghtening with 2#    Consulted and Agree with Plan of Care  Patient       Patient will benefit from skilled therapeutic intervention in order to improve the following deficits and impairments:  Decreased activity tolerance, Decreased strength, Decreased range of motion, Pain, Impaired UE functional use, Increased fascial restrictions  Visit Diagnosis: Other symptoms and signs involving the musculoskeletal system  Chronic left shoulder pain  Stiffness of left shoulder, not elsewhere classified    Problem List Patient Active Problem List   Diagnosis Date Noted  . Hx of colonic polyps 10/30/2010  . GERD (gastroesophageal reflux disease) 10/30/2010  . Constipation 10/30/2010    Limmie PatriciaLaura Soha Thorup, OTR/L,CBIS  [phone]  05/15/2017, 11:49 AM  [facility] [address] [place], [place], [address] Phone: [phone]   Fax:  [phone]  Name: Haynes Hoehnheresa D Flaming MRN: [id number] Date of Birth: [birth year]

## 2017-05-15 NOTE — Telephone Encounter (Signed)
05/15/17  Therapist not here -- will reschedule if something comes available... I told patient I would add her to the wait list

## 2017-05-18 ENCOUNTER — Telehealth (HOSPITAL_COMMUNITY): Payer: Self-pay | Admitting: Family Medicine

## 2017-05-18 NOTE — Telephone Encounter (Signed)
05/18/17  I called and left a message to change the 4/30 app either to another day or to Leslie's schedule on 4/29 in the afternoon.

## 2017-05-19 ENCOUNTER — Ambulatory Visit (HOSPITAL_COMMUNITY): Payer: Medicare Other

## 2017-05-19 DIAGNOSIS — M25512 Pain in left shoulder: Secondary | ICD-10-CM

## 2017-05-19 DIAGNOSIS — R29898 Other symptoms and signs involving the musculoskeletal system: Secondary | ICD-10-CM | POA: Diagnosis not present

## 2017-05-19 DIAGNOSIS — G8929 Other chronic pain: Secondary | ICD-10-CM

## 2017-05-19 DIAGNOSIS — M25612 Stiffness of left shoulder, not elsewhere classified: Secondary | ICD-10-CM

## 2017-05-19 NOTE — Therapy (Signed)
[place] [facility] [address] , [place], [address] Phone: [phone]   Fax:  [phone]  Occupational Therapy Treatment  Patient Details  Name: Barbara [name] MRN: [id number] Date of Birth: 1945/10/02 Referring Provider: Dominica SeverinWilliam Gramig, MD   Encounter Date: 05/19/2017  OT End of Session - 05/19/17 0857    Visit Number  9    Number of Visits  16    Date for OT Re-Evaluation  06/13/17    Authorization Type  [employer]HC Medicare $20 co-pay no visit limit. Based on medical necessity.    OT Start Time  [phone]    OT Stop Time  0900    OT Time Calculation (min)  37 min    Activity Tolerance  Patient tolerated treatment well    Behavior During Therapy  WFL for tasks assessed/performed       Past Medical History:  Diagnosis Date  . Anxiety   . Asthma   . Bladder spasms   . DDD (degenerative disc disease)   . GERD (gastroesophageal reflux disease)   . Hypercholesteremia   . Sciatic nerve disease   . Sleep apnea     Past Surgical History:  Procedure Laterality Date  . ABDOMINAL HYSTERECTOMY    . BREAST BIOPSY     "precancer"-left  . CATARACT EXTRACTION W/PHACO Left 08/04/2016   Procedure: CATARACT EXTRACTION PHACO AND INTRAOCULAR LENS PLACEMENT (IOC);  Surgeon: Gemma PayorHunt, Kerry, MD;  Location: [facility];  Service: Ophthalmology;  Laterality: Left;  CDE: 5.48  . CATARACT EXTRACTION W/PHACO Right 08/18/2016   Procedure: CATARACT EXTRACTION PHACO AND INTRAOCULAR LENS PLACEMENT RIGHT EYE;  Surgeon: Gemma PayorHunt, Kerry, MD;  Location: [facility];  Service: Ophthalmology;  Laterality: Right;  CDE: 8.30   . COLONOSCOPY     12 years ago, Dr. Jena Gaussourk, polyp  . complete hysterectomy  2000   fibroids  . TONSILLECTOMY    . TUBAL LIGATION      There were no vitals filed for this visit.  Subjective Assessment - 05/19/17 0841    Currently in Pain?  Yes    Pain Score  4     Pain Location  Shoulder    Pain Orientation  Left    Pain Descriptors / Indicators  Sore    Pain Type  Acute pain    Pain Radiating Towards  N/A    Pain Onset  Today    Pain Frequency  Intermittent    Aggravating Factors   Cutting monkey grass yesterday    Pain Relieving Factors  tylenol    Effect of Pain on Daily Activities  No effect    Multiple Pain Sites  No                   OT Treatments/Exercises (OP) - 05/19/17 [id number]      Exercises   Exercises  Shoulder      Shoulder Exercises: Supine   Protraction  PROM;5 reps;Strengthening;10 reps    Protraction Weight (lbs)  2    Horizontal ABduction  PROM;5 reps;Strengthening;10 reps    Horizontal ABduction Weight (lbs)  2    External Rotation  PROM;5 reps;Strengthening;10 reps    External Rotation Weight (lbs)  2    Internal Rotation  PROM;5 reps;Strengthening;10 reps    Internal Rotation Weight (lbs)  2    Flexion  PROM;5 reps;Strengthening;10 reps    Shoulder Flexion Weight (lbs)  2    ABduction  PROM;5 reps;Strengthening;10 reps    Shoulder ABduction Weight (lbs)  2      Shoulder Exercises: Sidelying   External Rotation  Strengthening;12 reps    External Rotation Weight (lbs)  2    Internal Rotation  Strengthening;12 reps    Internal Rotation Weight (lbs)  2    Flexion  Strengthening;12 reps    Flexion Weight (lbs)  2    ABduction  Strengthening;12 reps    ABduction Weight (lbs)  2    Other Sidelying Exercises  Protraction; 12X; 2#    Other Sidelying Exercises  Horizontal abduction; 12X; 2#      Shoulder Exercises: ROM/Strengthening   Proximal Shoulder Strengthening, Supine  12X with 2# no rest breaks    Ball on Wall  1' flexion 1' abduction green ball      Manual Therapy   Manual Therapy  Myofascial release    Manual therapy comments  performed separately from all other skilled interventions    Myofascial Release  Myofascial release and manual stretching completed to left upper arm, trapezius, and scapularis region to decreased fascial restrictions and increase joint mobiility in a pain free zone.                 OT Short Term Goals - 04/17/17 1007      OT SHORT TERM GOAL #1   Title  Patient will be educated and independent with HEP in order to increase functional use and mobility of her LUE.    Time  4    Period  Weeks    Status  On-going      OT SHORT TERM GOAL #2   Title  Patient will increase P/ROM to [facility] to increase ability to complete dressing and bathing tasks with less difficulty.     Time  4    Period  Weeks    Status  On-going      OT SHORT TERM GOAL #3   Title  Patient will increase LUE strength to 3+/5 to increase ability to complete lightweight household tasks using LUE.    Time  4    Period  Weeks    Status  On-going      OT SHORT TERM GOAL #4   Title  Patient will decrease pain level to 5/10 in LUE during daily tasks.     Time  4    Period  Weeks    Status  On-going      OT SHORT TERM GOAL #5   Title  Patient will decrease fascial restrictions to mod amount in left UE to increase mobility needed for functional reaching tasks.     Time  4    Period  Weeks    Status  On-going        OT Long Term Goals - 04/17/17 1007      OT LONG TERM GOAL #1   Title  Patient will return to highest level of independence using LUE as dominant extremity.     Time  8    Period  Weeks    Status  On-going      OT LONG TERM GOAL #2   Title  Patient will increase LUE A/ROM to [facility] to increase ability to complete hair washing and other reaching overhead activities.     Time  8    Period  Weeks    Status  On-going      OT LONG TERM GOAL #3   Title  Patient will increase LUE strength to 4+/5 to increase ability to return to normal lifting  activities at home.    Time  8    Period  Weeks    Status  On-going      OT LONG TERM GOAL #4   Title  Patient will decrease pain level in LUE during daily task to approximately 2 or 3/10.    Time  8    Period  Weeks    Status  On-going      OT LONG TERM GOAL #5   Title  Patient will decrease fascial restrictions to  min amount or less in LUE in order to increase functional mobility needed to complete overhead reaching tasks.     Time  8    Period  Weeks    Status  On-going            Plan - 05/19/17 0859    Clinical Impression Statement  A: Pt continues to require manual therapy to address fascial restrictions in LUE. Continued with sidelying strength and was able to increase repetitions to 12 supine and sidelying. VC for form and technique.    Plan  P: Continue with manual therapy to improve ROM and decrease pain. Add overhead lacing.     Consulted and Agree with Plan of Care  Patient       Patient will benefit from skilled therapeutic intervention in order to improve the following deficits and impairments:  Decreased activity tolerance, Decreased strength, Decreased range of motion, Pain, Impaired UE functional use, Increased fascial restrictions  Visit Diagnosis: Other symptoms and signs involving the musculoskeletal system  Chronic left shoulder pain  Stiffness of left shoulder, not elsewhere classified    Problem List Patient Active Problem List   Diagnosis Date Noted  . Hx of colonic polyps 10/30/2010  . GERD (gastroesophageal reflux disease) 10/30/2010  . Constipation 10/30/2010   Limmie Patricia, OTR/L,CBIS  [phone]  05/19/2017, 9:01 AM  [facility] [address] [place], [place], [address] Phone: [phone]   Fax:  [phone]  Name: ALETTA EDMUNDS MRN: [id number] Date of Birth: 01/02/1946

## 2017-05-21 ENCOUNTER — Encounter (HOSPITAL_COMMUNITY): Payer: Medicare Other

## 2017-05-22 ENCOUNTER — Encounter

## 2017-05-25 ENCOUNTER — Telehealth (HOSPITAL_COMMUNITY): Payer: Self-pay | Admitting: Occupational Therapy

## 2017-05-25 ENCOUNTER — Ambulatory Visit (HOSPITAL_COMMUNITY): Payer: Medicare Other | Admitting: Occupational Therapy

## 2017-05-25 NOTE — Telephone Encounter (Signed)
She is not feeling well and wanted to cancel

## 2017-05-26 ENCOUNTER — Encounter (HOSPITAL_COMMUNITY): Payer: Medicare Other

## 2017-05-29 ENCOUNTER — Other Ambulatory Visit: Payer: Self-pay

## 2017-05-29 ENCOUNTER — Ambulatory Visit (HOSPITAL_COMMUNITY): Payer: Medicare Other | Attending: Orthopedic Surgery

## 2017-05-29 ENCOUNTER — Encounter (HOSPITAL_COMMUNITY): Payer: Self-pay

## 2017-05-29 DIAGNOSIS — M25612 Stiffness of left shoulder, not elsewhere classified: Secondary | ICD-10-CM | POA: Diagnosis present

## 2017-05-29 DIAGNOSIS — G8929 Other chronic pain: Secondary | ICD-10-CM | POA: Diagnosis present

## 2017-05-29 DIAGNOSIS — M25512 Pain in left shoulder: Secondary | ICD-10-CM | POA: Diagnosis not present

## 2017-05-29 DIAGNOSIS — R29898 Other symptoms and signs involving the musculoskeletal system: Secondary | ICD-10-CM | POA: Diagnosis present

## 2017-05-29 NOTE — Therapy (Signed)
[place] [facility] [address] [place], [place], [address] Phone: [phone]   Fax:  [phone]  Occupational Therapy Treatment  Patient Details  Name: Barbara [name] MRN: [id number] Date of Birth: [birth year] Referring Provider: Dominica Severin, MD   Encounter Date: 05/29/2017  OT End of Session - 05/29/17 0855    Visit Number  10    Number of Visits  16    Date for OT Re-Evaluation  06/13/17    Authorization Type  [employer] Medicare $20 co-pay no visit limit. Based on medical necessity.    OT Start Time  0820    OT Stop Time  0900    OT Time Calculation (min)  40 min    Activity Tolerance  Patient tolerated treatment well    Behavior During Therapy  WFL for tasks assessed/performed       Past Medical History:  Diagnosis Date  . Anxiety   . Asthma   . Bladder spasms   . DDD (degenerative disc disease)   . GERD (gastroesophageal reflux disease)   . Hypercholesteremia   . Sciatic nerve disease   . Sleep apnea     Past Surgical History:  Procedure Laterality Date  . ABDOMINAL HYSTERECTOMY    . BREAST BIOPSY     "precancer"-left  . CATARACT EXTRACTION W/PHACO Left 08/04/2016   Procedure: CATARACT EXTRACTION PHACO AND INTRAOCULAR LENS PLACEMENT (IOC);  Surgeon: Gemma Payor, MD;  Location: [facility];  Service: Ophthalmology;  Laterality: Left;  CDE: 5.48  . CATARACT EXTRACTION W/PHACO Right 08/18/2016   Procedure: CATARACT EXTRACTION PHACO AND INTRAOCULAR LENS PLACEMENT RIGHT EYE;  Surgeon: Gemma Payor, MD;  Location: [facility];  Service: Ophthalmology;  Laterality: Right;  CDE: 8.30   . COLONOSCOPY     12 years ago, Dr. Jena Gauss, polyp  . complete hysterectomy  2000   fibroids  . TONSILLECTOMY    . TUBAL LIGATION      There were no vitals filed for this visit.  Subjective Assessment - 05/29/17 0833    Subjective   S: It will hurt for no reason.     Currently in Pain?  No/denies         [facility] OT Assessment - 05/29/17 0834       Assessment   Medical Diagnosis  Left Frozen Shoulder      Precautions   Precautions  None    Precaution Comments  Gentle P/ROM. AA/ROM, A/ROM               OT Treatments/Exercises (OP) - 05/29/17 0834      Exercises   Exercises  Shoulder      Shoulder Exercises: Supine   Protraction  PROM;5 reps;Strengthening;12 reps    Protraction Weight (lbs)  2    Horizontal ABduction  PROM;5 reps;Strengthening;12 reps    Horizontal ABduction Weight (lbs)  2    External Rotation  PROM;5 reps;Strengthening;12 reps    External Rotation Weight (lbs)  2    Internal Rotation  PROM;5 reps;Strengthening;12 reps    Internal Rotation Weight (lbs)  2    Flexion  PROM;5 reps;Strengthening;12 reps    Shoulder Flexion Weight (lbs)  2    ABduction  PROM;5 reps;Strengthening;12 reps    Shoulder ABduction Weight (lbs)  2      Shoulder Exercises: Sidelying   External Rotation  Strengthening;12 reps    External Rotation Weight (lbs)  2    Internal Rotation  Strengthening;12 reps    Internal Rotation Weight (  lbs)  2    Flexion  Strengthening;12 reps    Flexion Weight (lbs)  2    ABduction  Strengthening;12 reps    ABduction Weight (lbs)  2    Other Sidelying Exercises  Protraction; 12X; 2#    Other Sidelying Exercises  Horizontal abduction; 12X; 2#      Shoulder Exercises: Standing   Extension  Theraband;10 reps    Theraband Level (Shoulder Extension)  Level 3 (Green)    Row  [employer] reps    Theraband Level (Shoulder Row)  Level 3 (Green)    Retraction  Theraband;10 reps    Theraband Level (Shoulder Retraction)  Level 3 (Green)      Shoulder Exercises: ROM/Strengthening   Over Head Lace  1'    X to V Arms  12X with 1#    Proximal Shoulder Strengthening, Supine  12X with 2# no rest breaks    Ball on Wall  1' flexion 1' abduction green ball      Manual Therapy   Manual Therapy  Myofascial release    Manual therapy comments  performed separately from all other skilled interventions     Myofascial Release  Myofascial release and manual stretching completed to left upper arm, trapezius, and scapularis region to decreased fascial restrictions and increase joint mobiility in a pain free zone.                OT Short Term Goals - 04/17/17 1007      OT SHORT TERM GOAL #1   Title  Patient will be educated and independent with HEP in order to increase functional use and mobility of her LUE.    Time  4    Period  Weeks    Status  On-going      OT SHORT TERM GOAL #2   Title  Patient will increase P/ROM to [facility] to increase ability to complete dressing and bathing tasks with less difficulty.     Time  4    Period  Weeks    Status  On-going      OT SHORT TERM GOAL #3   Title  Patient will increase LUE strength to 3+/5 to increase ability to complete lightweight household tasks using LUE.    Time  4    Period  Weeks    Status  On-going      OT SHORT TERM GOAL #4   Title  Patient will decrease pain level to 5/10 in LUE during daily tasks.     Time  4    Period  Weeks    Status  On-going      OT SHORT TERM GOAL #5   Title  Patient will decrease fascial restrictions to mod amount in left UE to increase mobility needed for functional reaching tasks.     Time  4    Period  Weeks    Status  On-going        OT Long Term Goals - 04/17/17 1007      OT LONG TERM GOAL #1   Title  Patient will return to highest level of independence using LUE as dominant extremity.     Time  8    Period  Weeks    Status  On-going      OT LONG TERM GOAL #2   Title  Patient will increase LUE A/ROM to [facility] to increase ability to complete hair washing and other reaching overhead activities.     Time  8  Period  Weeks    Status  On-going      OT LONG TERM GOAL #3   Title  Patient will increase LUE strength to 4+/5 to increase ability to return to normal lifting activities at home.    Time  8    Period  Weeks    Status  On-going      OT LONG TERM GOAL #4   Title  Patient will  decrease pain level in LUE during daily task to approximately 2 or 3/10.    Time  8    Period  Weeks    Status  On-going      OT LONG TERM GOAL #5   Title  Patient will decrease fascial restrictions to min amount or less in LUE in order to increase functional mobility needed to complete overhead reaching tasks.     Time  8    Period  Weeks    Status  On-going            Plan - 05/29/17 9147    Clinical Impression Statement  A: Pt required manual therapy to address fascial restrictions in LUE. Increased to green band for scapular strengthening. VC for form and technique.    Plan  P: Continue with manual therapy to improve pain level and comfort. Add therapy ball shoulder strengthening (circles, chest press, diagonals, flexion).    Consulted and Agree with Plan of Care  Patient       Patient will benefit from skilled therapeutic intervention in order to improve the following deficits and impairments:  Decreased activity tolerance, Decreased strength, Decreased range of motion, Pain, Impaired UE functional use, Increased fascial restrictions  Visit Diagnosis: Chronic left shoulder pain  Other symptoms and signs involving the musculoskeletal system  Stiffness of left shoulder, not elsewhere classified    Problem List Patient Active Problem List   Diagnosis Date Noted  . Hx of colonic polyps 10/30/2010  . GERD (gastroesophageal reflux disease) 10/30/2010  . Constipation 10/30/2010   Limmie Patricia, OTR/L,CBIS  [phone]  05/29/2017, 8:59 AM  [place] [facility] [address] [place], [place], [address] Phone: [phone]   Fax:  [phone]  Name: ARLONE LENHARDT MRN: [id number] Date of Birth: 02-Dec-1945

## 2017-06-02 ENCOUNTER — Other Ambulatory Visit: Payer: Self-pay

## 2017-06-02 ENCOUNTER — Encounter (HOSPITAL_COMMUNITY): Payer: Self-pay

## 2017-06-02 ENCOUNTER — Ambulatory Visit (HOSPITAL_COMMUNITY): Payer: Medicare Other

## 2017-06-02 DIAGNOSIS — G8929 Other chronic pain: Secondary | ICD-10-CM

## 2017-06-02 DIAGNOSIS — R29898 Other symptoms and signs involving the musculoskeletal system: Secondary | ICD-10-CM

## 2017-06-02 DIAGNOSIS — M25512 Pain in left shoulder: Secondary | ICD-10-CM | POA: Diagnosis not present

## 2017-06-02 DIAGNOSIS — M25612 Stiffness of left shoulder, not elsewhere classified: Secondary | ICD-10-CM

## 2017-06-02 NOTE — Therapy (Signed)
[facility] [address] [place], [place], [address] Phone: [phone]   Fax:  [phone]  Occupational Therapy Treatment  Patient Details  Name: Barbara [name] MRN: [id number] Date of Birth: 1945/10/26 Referring Provider: Dominica Severin, MD   Encounter Date: 06/02/2017  OT End of Session - 06/02/17 0842    Visit Number  11    Number of Visits  16    Date for OT Re-Evaluation  06/13/17    Authorization Type  [employer] Medicare $20 co-pay no visit limit. Based on medical necessity.    OT Start Time  0818    OT Stop Time  0900    OT Time Calculation (min)  42 min    Activity Tolerance  Patient tolerated treatment well    Behavior During Therapy  WFL for tasks assessed/performed       Past Medical History:  Diagnosis Date  . Anxiety   . Asthma   . Bladder spasms   . DDD (degenerative disc disease)   . GERD (gastroesophageal reflux disease)   . Hypercholesteremia   . Sciatic nerve disease   . Sleep apnea     Past Surgical History:  Procedure Laterality Date  . ABDOMINAL HYSTERECTOMY    . BREAST BIOPSY     "precancer"-left  . CATARACT EXTRACTION W/PHACO Left 08/04/2016   Procedure: CATARACT EXTRACTION PHACO AND INTRAOCULAR LENS PLACEMENT (IOC);  Surgeon: Gemma Payor, MD;  Location: [facility];  Service: Ophthalmology;  Laterality: Left;  CDE: 5.48  . CATARACT EXTRACTION W/PHACO Right 08/18/2016   Procedure: CATARACT EXTRACTION PHACO AND INTRAOCULAR LENS PLACEMENT RIGHT EYE;  Surgeon: Gemma Payor, MD;  Location: [facility];  Service: Ophthalmology;  Laterality: Right;  CDE: 8.30   . COLONOSCOPY     12 years ago, Dr. Jena Gauss, polyp  . complete hysterectomy  2000   fibroids  . TONSILLECTOMY    . TUBAL LIGATION      There were no vitals filed for this visit.  Subjective Assessment - 06/02/17 0839    Subjective   S: It will hurt but it just has it's own mind.     Currently in Pain?  No/denies         [facility] OT Assessment - 06/02/17  0840      Assessment   Medical Diagnosis  Left Frozen Shoulder      Precautions   Precautions  None    Precaution Comments  Gentle P/ROM. AA/ROM, A/ROM               OT Treatments/Exercises (OP) - 06/02/17 0840      Exercises   Exercises  Shoulder      Shoulder Exercises: Supine   Protraction  PROM;5 reps;Strengthening;12 reps    Protraction Weight (lbs)  2    Horizontal ABduction  PROM;5 reps;Strengthening;12 reps    Horizontal ABduction Weight (lbs)  2    External Rotation  PROM;5 reps;Strengthening;12 reps    External Rotation Weight (lbs)  2    Internal Rotation  PROM;5 reps;Strengthening;12 reps    Internal Rotation Weight (lbs)  2    Flexion  PROM;5 reps;Strengthening;12 reps    Shoulder Flexion Weight (lbs)  2    ABduction  PROM;5 reps;Strengthening;12 reps    Shoulder ABduction Weight (lbs)  2      Shoulder Exercises: Sidelying   External Rotation  Strengthening;12 reps    External Rotation Weight (lbs)  2    Internal Rotation  Strengthening;12 reps  Internal Rotation Weight (lbs)  2    Flexion  Strengthening;12 reps    Flexion Weight (lbs)  2    ABduction  Strengthening;12 reps    ABduction Weight (lbs)  2    Other Sidelying Exercises  Protraction; 12X; 2#    Other Sidelying Exercises  Horizontal abduction; 12X; 2#      Shoulder Exercises: ROM/Strengthening   Over Head Lace  2' with 1# wrist weight    Other ROM/Strengthening Exercises  Green therapy ball. Chest press, flexion, circles (both directions), diagonals 12X      Manual Therapy   Manual Therapy  Myofascial release    Manual therapy comments  performed separately from all other skilled interventions    Myofascial Release  Myofascial release and manual stretching completed to left upper arm, trapezius, and scapularis region to decreased fascial restrictions and increase joint mobiility in a pain free zone.                OT Short Term Goals - 04/17/17 1007      OT SHORT TERM  GOAL #1   Title  Patient will be educated and independent with HEP in order to increase functional use and mobility of her LUE.    Time  4    Period  Weeks    Status  On-going      OT SHORT TERM GOAL #2   Title  Patient will increase P/ROM to [facility] to increase ability to complete dressing and bathing tasks with less difficulty.     Time  4    Period  Weeks    Status  On-going      OT SHORT TERM GOAL #3   Title  Patient will increase LUE strength to 3+/5 to increase ability to complete lightweight household tasks using LUE.    Time  4    Period  Weeks    Status  On-going      OT SHORT TERM GOAL #4   Title  Patient will decrease pain level to 5/10 in LUE during daily tasks.     Time  4    Period  Weeks    Status  On-going      OT SHORT TERM GOAL #5   Title  Patient will decrease fascial restrictions to mod amount in left UE to increase mobility needed for functional reaching tasks.     Time  4    Period  Weeks    Status  On-going        OT Long Term Goals - 04/17/17 1007      OT LONG TERM GOAL #1   Title  Patient will return to highest level of independence using LUE as dominant extremity.     Time  8    Period  Weeks    Status  On-going      OT LONG TERM GOAL #2   Title  Patient will increase LUE A/ROM to [facility] to increase ability to complete hair washing and other reaching overhead activities.     Time  8    Period  Weeks    Status  On-going      OT LONG TERM GOAL #3   Title  Patient will increase LUE strength to 4+/5 to increase ability to return to normal lifting activities at home.    Time  8    Period  Weeks    Status  On-going      OT LONG TERM GOAL #4  Title  Patient will decrease pain level in LUE during daily task to approximately 2 or 3/10.    Time  8    Period  Weeks    Status  On-going      OT LONG TERM GOAL #5   Title  Patient will decrease fascial restrictions to min amount or less in LUE in order to increase functional mobility needed to complete  overhead reaching tasks.     Time  8    Period  Weeks    Status  On-going            Plan - 06/02/17 1610    Clinical Impression Statement  A: patient with min fascial restrictions in anterior portion of shoulder with manual therapy techniques completed to address. Added therapy ball exercises for strengthening. VC for form and technique.     Plan  P: Reassessment and possible discharge.        Patient will benefit from skilled therapeutic intervention in order to improve the following deficits and impairments:  Decreased activity tolerance, Decreased strength, Decreased range of motion, Pain, Impaired UE functional use, Increased fascial restrictions  Visit Diagnosis: Chronic left shoulder pain  Other symptoms and signs involving the musculoskeletal system  Stiffness of left shoulder, not elsewhere classified    Problem List Patient Active Problem List   Diagnosis Date Noted  . Hx of colonic polyps 10/30/2010  . GERD (gastroesophageal reflux disease) 10/30/2010  . Constipation 10/30/2010   ,Limmie Patricia, OTR/L,CBIS  [phone]  06/02/2017, 9:07 AM  [facility] [address] [place], [place], [address] Phone: [phone]   Fax:  [phone]  Name: Barbara [name] MRN: [id number] Date of Birth: [birth year]

## 2017-06-03 ENCOUNTER — Encounter (HOSPITAL_COMMUNITY): Payer: Self-pay | Admitting: Physical Therapy

## 2017-06-03 NOTE — Therapy (Signed)
[place], [place], [address] Phone: [phone]   Fax:  [phone]  Patient Details  Name: Barbara [name] MRN: [id number] Date of Birth: [birth year] Referring Provider:  No ref. provider found  Encounter Date: 06/03/2017  PHYSICAL THERAPY DISCHARGE SUMMARY  Visits from Start of Care: 8  Current functional level related to goals / functional outcomes: Patient had achieved some goals, however full assessment was not completed as patient did not return after 8th visit for re-assessment. See therapy note on 03/04/17 for most recent progress.   Remaining deficits: Patient had achieved some goals, however full assessment was not completed as patient did not return after 8th visit for re-assessment. See therapy note on 03/04/17 for most recent progress. Patient reported that she had an MRI and bone spurs on heel with physician stating patient should continue exercises at home and therefore requested discharge from physical therapy services.     Education / Equipment: Patient had been educated on an HEP. Patient did not return to therapy after 8th visit. See note on 03/04/17 for more detail on most recent therapy session.  Plan: Patient agrees to discharge.  Patient goals were not met. Patient is being discharged due to not returning since the last visit.  ?????        Clarene Critchley PT, DPT 9:34 AM, 06/03/17 [place], [place], [address] Phone: [phone]   Fax:  [phone]

## 2017-06-05 ENCOUNTER — Encounter (HOSPITAL_COMMUNITY): Payer: Medicare Other

## 2017-06-12 ENCOUNTER — Encounter (HOSPITAL_COMMUNITY): Payer: Self-pay

## 2017-06-12 ENCOUNTER — Other Ambulatory Visit: Payer: Self-pay

## 2017-06-12 ENCOUNTER — Ambulatory Visit (HOSPITAL_COMMUNITY): Payer: Medicare Other

## 2017-06-12 DIAGNOSIS — R29898 Other symptoms and signs involving the musculoskeletal system: Secondary | ICD-10-CM

## 2017-06-12 DIAGNOSIS — M25512 Pain in left shoulder: Secondary | ICD-10-CM | POA: Diagnosis not present

## 2017-06-12 DIAGNOSIS — M25612 Stiffness of left shoulder, not elsewhere classified: Secondary | ICD-10-CM

## 2017-06-12 DIAGNOSIS — G8929 Other chronic pain: Secondary | ICD-10-CM

## 2017-06-12 NOTE — Patient Instructions (Signed)
Complete the follow exercises 10-15 repetitions. 1 set. 1 time a day or once every other day.   Strengthening: Chest Pull - Resisted   Hold Theraband in front of body with hands about shoulder width a part. Pull band a part and back together slowly. Repeat ____ times. Complete ____ set(s) per session.. Repeat ____ session(s) per day.  http://orth.exer.us/926   Copyright  VHI. All rights reserved.   PNF Strengthening: Resisted   Standing with resistive band around each hand, bring right arm up and away, thumb back. Repeat ____ times per set. Do ____ sets per session. Do ____ sessions per day.                           Resisted External Rotation: in Neutral - Bilateral   Sit or stand, tubing in both hands, elbows at sides, bent to 90, forearms forward. Pinch shoulder blades together and rotate forearms out. Keep elbows at sides. Repeat ____ times per set. Do ____ sets per session. Do ____ sessions per day.  http://orth.exer.us/966   Copyright  VHI. All rights reserved.   PNF Strengthening: Resisted   Standing, hold resistive band above head. Bring right arm down and out from side. Repeat ____ times per set. Do ____ sets per session. Do ____ sessions per day.  http://orth.exer.us/922   Copyright  VHI. All rights reserved.

## 2017-06-12 NOTE — Therapy (Addendum)
[place], [place], [address] Phone: [phone]   Fax:  [phone]  Occupational Therapy Treatment  Patient Details  Name: Barbara [name] MRN: [id number] Date of Birth: July 22, 1945 Referring Provider: Roseanne Kaufman, MD   Encounter Date: 06/12/2017  OT End of Session - 06/12/17 0910    Visit Number  12    Number of Visits  16    Date for OT Re-Evaluation  --    Authorization Type  [facility] Medicare $20 co-pay no visit limit. Based on medical necessity.    OT Start Time  [phone] reassess and discharge    OT Stop Time  0901    OT Time Calculation (min)  44 min    Activity Tolerance  Patient tolerated treatment well    Behavior During Therapy  [facility] for tasks assessed/performed       Past Medical History:  Diagnosis Date  . Anxiety   . Asthma   . Bladder spasms   . DDD (degenerative disc disease)   . GERD (gastroesophageal reflux disease)   . Hypercholesteremia   . Sciatic nerve disease   . Sleep apnea     Past Surgical History:  Procedure Laterality Date  . ABDOMINAL HYSTERECTOMY    . BREAST BIOPSY     "precancer"-left  . CATARACT EXTRACTION W/PHACO Left 08/04/2016   Procedure: CATARACT EXTRACTION PHACO AND INTRAOCULAR LENS PLACEMENT (IOC);  Surgeon: Tonny Branch, MD;  Location: [facility];  Service: Ophthalmology;  Laterality: Left;  CDE: 5.48  . CATARACT EXTRACTION W/PHACO Right 08/18/2016   Procedure: CATARACT EXTRACTION PHACO AND INTRAOCULAR LENS PLACEMENT RIGHT EYE;  Surgeon: Tonny Branch, MD;  Location: [facility];  Service: Ophthalmology;  Laterality: Right;  CDE: 8.30   . COLONOSCOPY     12 years ago, Dr. Gala Romney, polyp  . complete hysterectomy  2000   fibroids  . TONSILLECTOMY    . TUBAL LIGATION      There were no vitals filed for this visit.  Subjective Assessment - 06/12/17 0823    Subjective   S: "The shoulder doesn't do the freezing part like it did."    Pertinent History  Patient is a 72 y/o female  S/P left frozen shoulder which has presented itself 6 months ago. Patient received a shot for pain at her last appointment with Dr. Amedeo Plenty. Dr. Amedeo Plenty has referred patient to occupational therapy for evaluation and treatment.     Patient Stated Goals  To be able to use her left arm as her dominant extremity.     Currently in Pain?  No/denies    Pain Score  0-No pain    Pain Onset  --         [facility] OT Assessment - 06/12/17 0820      Assessment   Medical Diagnosis  Left Frozen Shoulder      Precautions   Precautions  None    Precaution Comments  Gentle P/ROM. AA/ROM, A/ROM      Observation/Other Assessments   Focus on Therapeutic Outcomes (FOTO)   68/100       ROM / Strength   AROM / PROM / Strength  AROM;PROM;Strength      Palpation   Palpation comment  Min fascial restrictions in left anterior deltoid/shoulder region      AROM   Overall AROM Comments  Assessed seated. IR/er abducted     AROM Assessment Site  Shoulder    Right/Left Shoulder  Left    Left  Shoulder Flexion  165 Degrees previous: 157    Left Shoulder ABduction  154 Degrees previous: 155    Left Shoulder Internal Rotation  90 Degrees previous: 60    Left Shoulder External Rotation  90 Degrees previous: 80      PROM   Overall PROM   Within functional limits for tasks performed      Strength   Overall Strength Comments  Assessed seated. IR/er adducted.    Strength Assessment Site  Shoulder    Right/Left Shoulder  Left    Left Shoulder Flexion  5/5 previous: 3+/5    Left Shoulder ABduction  5/5 previous: 5/5    Left Shoulder Internal Rotation  5/5 previous: same    Left Shoulder External Rotation  5/5 previous: 4+/5               OT Treatments/Exercises (OP) - 06/12/17 0825      Exercises   Exercises  Shoulder      Shoulder Exercises: Supine   Protraction  PROM;5 reps    Horizontal ABduction  PROM;5 reps    External Rotation  PROM;5 reps    Internal Rotation  PROM;5 reps    Flexion  PROM;5  reps    ABduction  PROM;5 reps      Manual Therapy   Manual Therapy  Myofascial release    Manual therapy comments  performed separately from all other skilled interventions    Myofascial Release  Myofascial release and manual stretching completed to left upper arm, trapezius, and scapularis region to decreased fascial restrictions and increase joint mobiility in a pain free zone.              OT Education - 06/12/17 0905    Education provided  Yes    Education Details  Pt provided with update HEP to include red theraband resistive exercises for pt to complete following discharge. Goals reviewed with pt.    Person(s) Educated  Patient    Methods  Explanation;Demonstration;Verbal cues;Handout    Comprehension  Verbalized understanding;Returned demonstration       OT Short Term Goals - 06/12/17 0844      OT SHORT TERM GOAL #1   Title  Patient will be educated and independent with HEP in order to increase functional use and mobility of her LUE.    Time  4    Period  Weeks    Status  Achieved      OT SHORT TERM GOAL #2   Title  Patient will increase P/ROM to [facility] to increase ability to complete dressing and bathing tasks with less difficulty.     Time  4    Period  Weeks    Status  Achieved      OT SHORT TERM GOAL #3   Title  Patient will increase LUE strength to 3+/5 to increase ability to complete lightweight household tasks using LUE.    Time  4    Period  Weeks    Status  Achieved      OT SHORT TERM GOAL #4   Title  Patient will decrease pain level to 5/10 in LUE during daily tasks.     Time  4    Period  Weeks    Status  Achieved      OT SHORT TERM GOAL #5   Title  Patient will decrease fascial restrictions to mod amount in left UE to increase mobility needed for functional reaching tasks.     Time  4    Period  Weeks    Status  Achieved        OT Long Term Goals - 06/12/17 0845      OT LONG TERM GOAL #1   Title  Patient will return to highest level of  independence using LUE as dominant extremity.     Time  8    Period  Weeks    Status  Achieved      OT LONG TERM GOAL #2   Title  Patient will increase LUE A/ROM to [facility] to increase ability to complete hair washing and other reaching overhead activities.     Time  8    Period  Weeks    Status  Achieved      OT LONG TERM GOAL #3   Title  Patient will increase LUE strength to 4+/5 to increase ability to return to normal lifting activities at home.    Time  8    Period  Weeks    Status  Achieved      OT LONG TERM GOAL #4   Title  Patient will decrease pain level in LUE during daily task to approximately 2 or 3/10.    Time  8    Period  Weeks    Status  Achieved      OT LONG TERM GOAL #5   Title  Patient will decrease fascial restrictions to min amount or less in LUE in order to increase functional mobility needed to complete overhead reaching tasks.     Time  8    Period  Weeks    Status  Achieved            Plan - 06/12/17 0910    Clinical Impression Statement  A: Pt reassessed for progress towards goals. Pt has achieved all short and long term goals and is ready for discharge from OT. Pt has improved in AROM, pain, and strength of the L shoulder overall. Pt reports that she is able to complete all necessary daily living tasks and is happy with how her shoulder has progressed.     Plan  P: Pt to be discharged from skilled OT services due to meeting all short and long term goals and being able to complete required daily living tasks.    Consulted and Agree with Plan of Care  Patient       Patient will benefit from skilled therapeutic intervention in order to improve the following deficits and impairments:  Decreased activity tolerance, Decreased strength, Decreased range of motion, Pain, Impaired UE functional use, Increased fascial restrictions  Visit Diagnosis: Chronic left shoulder pain  Other symptoms and signs involving the musculoskeletal system  Stiffness of left  shoulder, not elsewhere classified    Problem List Patient Active Problem List   Diagnosis Date Noted  . Hx of colonic polyps 10/30/2010  . GERD (gastroesophageal reflux disease) 10/30/2010  . Constipation 10/30/2010      Roderic Palau, OT student 06/12/2017, 9:34 AM  [place], [place], [address] Phone: [phone]   Fax:  [phone]  Name: Barbara [name] MRN: [id number] Date of Birth: 1945-08-18    OCCUPATIONAL THERAPY DISCHARGE SUMMARY  Visits from Start of Care: 7  Current functional level related to goals / functional outcomes: See above   Remaining deficits: None   Education / Equipment: See above Plan: Patient agrees to discharge.  Patient goals were met. Patient is being discharged due  to meeting the stated rehab goals.  ?????         Ailene Ravel, OTR/L,CBIS  [phone]

## 2017-07-01 DIAGNOSIS — M7670 Peroneal tendinitis, unspecified leg: Secondary | ICD-10-CM | POA: Insufficient documentation

## 2018-08-03 ENCOUNTER — Encounter (HOSPITAL_COMMUNITY): Payer: Self-pay | Admitting: *Deleted

## 2018-08-03 ENCOUNTER — Emergency Department (HOSPITAL_COMMUNITY)
Admission: EM | Admit: 2018-08-03 | Discharge: 2018-08-03 | Disposition: A | Discharge status: Home / Self Care | Payer: Medicare Other | Attending: Emergency Medicine | Admitting: Emergency Medicine

## 2018-08-03 ENCOUNTER — Emergency Department (HOSPITAL_COMMUNITY)
Admission: EM | Admit: 2018-08-03 | Discharge: 2018-08-04 | Disposition: A | Discharge status: Home / Self Care | Payer: Medicare Other | Source: Home / Self Care | Attending: Emergency Medicine | Admitting: Emergency Medicine

## 2018-08-03 ENCOUNTER — Emergency Department (HOSPITAL_COMMUNITY): Payer: Medicare Other

## 2018-08-03 ENCOUNTER — Encounter (HOSPITAL_COMMUNITY): Payer: Self-pay | Admitting: Emergency Medicine

## 2018-08-03 ENCOUNTER — Other Ambulatory Visit: Payer: Self-pay

## 2018-08-03 DIAGNOSIS — N132 Hydronephrosis with renal and ureteral calculous obstruction: Secondary | ICD-10-CM | POA: Insufficient documentation

## 2018-08-03 DIAGNOSIS — N281 Cyst of kidney, acquired: Secondary | ICD-10-CM | POA: Diagnosis not present

## 2018-08-03 DIAGNOSIS — N200 Calculus of kidney: Secondary | ICD-10-CM

## 2018-08-03 DIAGNOSIS — Z880 Allergy status to penicillin: Secondary | ICD-10-CM | POA: Insufficient documentation

## 2018-08-03 DIAGNOSIS — Z79899 Other long term (current) drug therapy: Secondary | ICD-10-CM | POA: Insufficient documentation

## 2018-08-03 DIAGNOSIS — R739 Hyperglycemia, unspecified: Secondary | ICD-10-CM

## 2018-08-03 DIAGNOSIS — J45909 Unspecified asthma, uncomplicated: Secondary | ICD-10-CM | POA: Insufficient documentation

## 2018-08-03 DIAGNOSIS — R1032 Left lower quadrant pain: Secondary | ICD-10-CM | POA: Diagnosis present

## 2018-08-03 DIAGNOSIS — L509 Urticaria, unspecified: Secondary | ICD-10-CM | POA: Insufficient documentation

## 2018-08-03 LAB — CBC WITH DIFFERENTIAL/PLATELET
Abs Immature Granulocytes: 0.02 10*3/uL (ref 0.00–0.07)
Basophils Absolute: 0 10*3/uL (ref 0.0–0.1)
Basophils Relative: 1 %
Eosinophils Absolute: 0 10*3/uL (ref 0.0–0.5)
Eosinophils Relative: 1 %
HCT: 42.4 % (ref 36.0–46.0)
Hemoglobin: 13.8 g/dL (ref 12.0–15.0)
Immature Granulocytes: 0 %
Lymphocytes Relative: 16 %
Lymphs Abs: 1.2 10*3/uL (ref 0.7–4.0)
MCH: 31.7 pg (ref 26.0–34.0)
MCHC: 32.5 g/dL (ref 30.0–36.0)
MCV: 97.2 fL (ref 80.0–100.0)
Monocytes Absolute: 0.5 10*3/uL (ref 0.1–1.0)
Monocytes Relative: 7 %
Neutro Abs: 5.8 10*3/uL (ref 1.7–7.7)
Neutrophils Relative %: 75 %
Platelets: 258 10*3/uL (ref 150–400)
RBC: 4.36 MIL/uL (ref 3.87–5.11)
RDW: 13.3 % (ref 11.5–15.5)
WBC: 7.7 10*3/uL (ref 4.0–10.5)
nRBC: 0 % (ref 0.0–0.2)

## 2018-08-03 LAB — URINALYSIS, ROUTINE W REFLEX MICROSCOPIC
Bacteria, UA: NONE SEEN
Bilirubin Urine: NEGATIVE
Glucose, UA: NEGATIVE mg/dL
Ketones, ur: 5 mg/dL — AB
Leukocytes,Ua: NEGATIVE
Nitrite: NEGATIVE
Protein, ur: NEGATIVE mg/dL
RBC / HPF: >50 RBC/hpf — ABNORMAL HIGH (ref 0–5)
Specific Gravity, Urine: 1.021 (ref 1.005–1.030)
pH: 6 (ref 5.0–8.0)

## 2018-08-03 LAB — BASIC METABOLIC PANEL
Anion gap: 15 (ref 5–15)
BUN: 17 mg/dL (ref 8–23)
CO2: 22 mmol/L (ref 22–32)
Calcium: 10.3 mg/dL (ref 8.9–10.3)
Chloride: 104 mmol/L (ref 98–111)
Creatinine, Ser: 0.7 mg/dL (ref 0.44–1.00)
GFR calc Af Amer: >60 mL/min (ref >60)
GFR calc non Af Amer: >60 mL/min (ref >60)
Glucose, Bld: 148 mg/dL — ABNORMAL HIGH (ref 70–99)
Potassium: 3.1 mmol/L — ABNORMAL LOW (ref 3.5–5.1)
Sodium: 141 mmol/L (ref 135–145)

## 2018-08-03 MED ORDER — KETOROLAC TROMETHAMINE 30 MG/ML IJ SOLN
30.0000 mg | Freq: Once | INTRAMUSCULAR | Status: AC
  Administered 2018-08-03: 30 mg via INTRAVENOUS
  Filled 2018-08-03: qty 1

## 2018-08-03 MED ORDER — TAMSULOSIN HCL 0.4 MG PO CAPS: 0.4000 mg | ORAL_CAPSULE | Freq: Every day | ORAL | 0 refills | Status: DC

## 2018-08-03 MED ORDER — PREDNISONE 50 MG PO TABS: 50.0000 mg | ORAL_TABLET | Freq: Every day | ORAL | 0 refills | Status: DC

## 2018-08-03 MED ORDER — FAMOTIDINE 20 MG PO TABS
20.0000 mg | ORAL_TABLET | Freq: Once | ORAL | Status: AC
  Administered 2018-08-04: 20 mg via ORAL
  Filled 2018-08-03: qty 1

## 2018-08-03 MED ORDER — OXYCODONE-ACETAMINOPHEN 5-325 MG PO TABS: 1.0000 | ORAL_TABLET | ORAL | 0 refills | Status: DC | PRN

## 2018-08-03 MED ORDER — ONDANSETRON HCL 4 MG/2ML IJ SOLN
4.0000 mg | Freq: Once | INTRAMUSCULAR | Status: AC
  Administered 2018-08-03: 4 mg via INTRAVENOUS
  Filled 2018-08-03: qty 2

## 2018-08-03 MED ORDER — SODIUM CHLORIDE 0.9 % IV BOLUS
500.0000 mL | Freq: Once | INTRAVENOUS | Status: AC
  Administered 2018-08-03: 500 mL via INTRAVENOUS

## 2018-08-03 MED ORDER — DOXEPIN HCL 10 MG PO CAPS: 10.0000 mg | ORAL_CAPSULE | Freq: Three times a day (TID) | ORAL | 0 refills | Status: DC

## 2018-08-03 MED ORDER — DOXEPIN HCL 10 MG PO CAPS
10.0000 mg | ORAL_CAPSULE | Freq: Once | ORAL | Status: AC
  Administered 2018-08-04: 10 mg via ORAL
  Filled 2018-08-03 (×2): qty 1

## 2018-08-03 MED ORDER — FENTANYL CITRATE (PF) 100 MCG/2ML IJ SOLN
50.0000 ug | Freq: Once | INTRAMUSCULAR | Status: AC
  Administered 2018-08-03: 50 ug via INTRAVENOUS
  Filled 2018-08-03: qty 2

## 2018-08-03 MED ORDER — PREDNISONE 50 MG PO TABS
60.0000 mg | ORAL_TABLET | Freq: Once | ORAL | Status: AC
  Administered 2018-08-04: 01:00:00 60 mg via ORAL
  Filled 2018-08-03: qty 1

## 2018-08-03 MED ORDER — ONDANSETRON HCL 8 MG PO TABS: 8.0000 mg | ORAL_TABLET | ORAL | 0 refills | Status: DC | PRN

## 2018-08-03 NOTE — ED Provider Notes (Addendum)
[facility] EMERGENCY DEPARTMENT Provider Note   CSN: [id number] Arrival date & time: 08/03/18  1021    History   Chief Complaint Chief Complaint  Patient presents with   Abdominal Pain    HPI Barbara [name] is a 73 y.o. female.     Abrupt onset left flank pain rating to the left lower quadrant several hours ago stated nausea and vomiting.  No dysuria, hematuria, fever, sweats, chills.  No known history of kidney stones.  Severity is moderate to severe.  Nothing makes symptoms better or worse.     Past Medical History:  Diagnosis Date   Anxiety    Asthma    Bladder spasms    DDD (degenerative disc disease)    GERD (gastroesophageal reflux disease)    Hypercholesteremia    Sciatic nerve disease    Sleep apnea     Patient Active Problem List   Diagnosis Date Noted   Hx of colonic polyps 10/30/2010   GERD (gastroesophageal reflux disease) 10/30/2010   Constipation 10/30/2010    Past Surgical History:  Procedure Laterality Date   ABDOMINAL HYSTERECTOMY     BREAST BIOPSY     "precancer"-left   CATARACT EXTRACTION W/PHACO Left 08/04/2016   Procedure: CATARACT EXTRACTION PHACO AND INTRAOCULAR LENS PLACEMENT (IOC);  Surgeon: Gemma PayorHunt, Kerry, MD;  Location: [facility];  Service: Ophthalmology;  Laterality: Left;  CDE: 5.48   CATARACT EXTRACTION W/PHACO Right 08/18/2016   Procedure: CATARACT EXTRACTION PHACO AND INTRAOCULAR LENS PLACEMENT RIGHT EYE;  Surgeon: Gemma PayorHunt, Kerry, MD;  Location: [facility];  Service: Ophthalmology;  Laterality: Right;  CDE: 8.30    COLONOSCOPY     12 years ago, Dr. Jena Gaussourk, polyp   complete hysterectomy  2000   fibroids   TONSILLECTOMY     TUBAL LIGATION       OB History    Gravida      Para      Term      Preterm      AB      Living  3     SAB      TAB      Ectopic      Multiple      Live Births               Home Medications    Prior to Admission medications   Medication Sig Start Date End Date Taking?  Authorizing Provider  ADVAIR DISKUS 250-50 MCG/DOSE AEPB Inhale 1 puff into the lungs 2 (two) times daily. 05/23/16   [provider]  albuterol (PROVENTIL HFA;VENTOLIN HFA) 108 (90 BASE) MCG/ACT inhaler Inhale 2 puffs into the lungs every 6 (six) hours as needed for wheezing or shortness of breath.     [provider]  atorvastatin (LIPITOR) 40 MG tablet Take 40 mg by mouth every other day.     [provider]  celecoxib (CELEBREX) 200 MG capsule Take 200 mg by mouth every other day.     [provider]  cyclobenzaprine (FLEXERIL) 10 MG tablet Take 10 mg by mouth 3 (three) times daily as needed. Sciatic nerve    [provider]  omeprazole (PRILOSEC) 20 MG capsule Take 20 mg by mouth daily.      [provider]  ondansetron (ZOFRAN) 8 MG tablet Take 1 tablet (8 mg total) by mouth every 4 (four) hours as needed. 08/03/18   Donnetta Hutchingook, Makale Pindell, MD  oxyCODONE-acetaminophen (PERCOCET) 5-325 MG tablet Take 1 tablet by mouth every 4 (  four) hours as needed. 08/03/18   Donnetta Hutchingook, Wardell Pokorski, MD  polyethylene glycol [facility](MIRALAX / Ethelene HalGLYCOLAX) packet Take 17 g by mouth daily as needed for moderate constipation.    [provider]  Probiotic Product (DIGESTIVE ADVANTAGE GUMMIES PO) Take 2 each by mouth daily.    [provider]  tamsulosin (FLOMAX) 0.4 MG CAPS capsule Take 1 capsule (0.4 mg total) by mouth daily. 08/03/18   Donnetta Hutchingook, Seretha Estabrooks, MD  tolterodine (DETROL LA) 4 MG 24 hr capsule Take 4 mg by mouth daily.      [provider]  venlafaxine (EFFEXOR-XR) 150 MG 24 hr capsule Take 150 mg by mouth daily.      [provider]    Family History Family History  Problem Relation Age of Onset   Colon cancer Maternal Grandmother    Breast cancer Mother    Breast cancer Maternal Aunt    Heart disease Other        father, brother   Lung cancer Maternal Grandfather    Inflammatory bowel disease Son        ?crohn's, [facility] Remicade    GI problems Brother        Bowel perforation   Ovarian cancer Neg Hx    Anesthesia problems Neg Hx    Hypotension Neg Hx    Malignant hyperthermia Neg Hx    Pseudochol deficiency Neg Hx     Social History Social History   Tobacco Use   Smoking status: Never Smoker   Smokeless tobacco: Never Used  Substance Use Topics   Alcohol use: No   Drug use: No     Allergies   Codeine and Penicillins   Review of Systems Review of Systems  All other systems reviewed and are negative.    Physical Exam Updated Vital Signs BP (!) 143/89    Pulse 62    Temp 97.8 F (36.6 C) (Oral)    Resp 18    Ht 5' (1.524 m)    Wt 99.8 kg    SpO2 98%    BMI 42.97 kg/m   Physical Exam Vitals signs and nursing note reviewed.  Constitutional:      Appearance: She is well-developed.  HENT:     Head: Normocephalic and atraumatic.  Eyes:     Conjunctiva/sclera: Conjunctivae normal.  Neck:     Musculoskeletal: Neck supple.  Cardiovascular:     Rate and Rhythm: Normal rate and regular rhythm.  Pulmonary:     Effort: Pulmonary effort is normal.     Breath sounds: Normal breath sounds.  Abdominal:     General: Bowel sounds are normal.     Palpations: Abdomen is soft.  Genitourinary:    Comments: Left flank tenderness. Musculoskeletal: Normal range of motion.  Skin:    General: Skin is warm and dry.  Neurological:     Mental Status: She is alert and oriented to person, place, and time.  Psychiatric:        Behavior: Behavior normal.      ED Treatments / Results  Labs (all labs ordered are listed, but only abnormal results are displayed) Labs Reviewed  BASIC METABOLIC PANEL - Abnormal; Notable for the following components:      Result Value   Potassium 3.1 (*)    Glucose, Bld 148 (*)    All other components within normal limits  URINALYSIS, ROUTINE W REFLEX MICROSCOPIC - Abnormal; Notable for the following components:   APPearance HAZY (*)  Hgb urine dipstick LARGE (*)      Ketones, ur 5 (*)    RBC / HPF >50 (*)    All other components within normal limits  CBC WITH DIFFERENTIAL/PLATELET    EKG None  Radiology Ct Renal Stone Study  Result Date: 08/03/2018 CLINICAL DATA:  LEFT flank pain question stone disease, acute onset of pain this morning at 0700 hours with constant nausea and 2 episodes of vomiting, history GERD, hysterectomy, asthma EXAM: CT ABDOMEN AND PELVIS WITHOUT CONTRAST TECHNIQUE: Multidetector CT imaging of the abdomen and pelvis was performed following the standard protocol without IV contrast. Sagittal and coronal MPR images reconstructed from axial data set. No oral contrast administered. COMPARISON:  None FINDINGS: Lower chest: Minimal LEFT basilar atelectasis Hepatobiliary: Liver and gallbladder normal appearance Pancreas: Normal appearance Spleen: Normal appearance Adrenals/Urinary Tract: Adrenal glands normal appearance. Complicated cysts at LEFT kidney 4.6 x 3.9 cm image 19 and 3.9 x 3.2 cm image 25, post complicated by mild wall calcification. LEFT hydronephrosis is identified secondary to a 3 mm LEFT ureteropelvic junction calculus. No RIGHT hydronephrosis. Ureters and bladder unremarkable. Stomach/Bowel: Normal appendix. Stomach and bowel loops normal appearance Vascular/Lymphatic: Aorta normal caliber. Minimal atherosclerotic calcifications of the iliac arteries. No adenopathy. Reproductive: Uterus surgically absent. Nonvisualization of ovaries. Other: No free air or free fluid.  No inflammatory process. Musculoskeletal: Osseous demineralization with degenerative disc disease changes at L3-L4. IMPRESSION: LEFT hydronephrosis secondary to a 3 mm LEFT UPJ calculus. 2 complicated cysts of the LEFT kidney containing mural calcification, lesions measuring 4.6 cm and 3.9 cm in greatest dimension; follow-up sonographic characterization recommended when patient's clinical condition permits adequate assessment. Electronically Signed   By: Ulyses SouthwardMark  Boles M.D.    On: 08/03/2018 11:48    Procedures Procedures (including critical care time)  Medications Ordered in ED Medications  sodium chloride 0.9 % bolus 500 mL (0 mLs Intravenous Stopped 08/03/18 1150)  ketorolac (TORADOL) 30 MG/ML injection 30 mg (30 mg Intravenous Given 08/03/18 1113)  fentaNYL (SUBLIMAZE) injection 50 mcg (50 mcg Intravenous Given 08/03/18 1111)  ondansetron (ZOFRAN) injection 4 mg (4 mg Intravenous Given 08/03/18 1112)     Initial Impression / Assessment and Plan / ED Course  I have reviewed the triage vital signs and the nursing notes.  Pertinent labs & imaging results that were available during my care of the patient were reviewed by me and considered in my medical decision making (see chart for details).        History and physical most consistent with kidney stone pain.  CT renal reveals a 3 mm distal stone with associated hydronephrosis along with a complex left renal cysts times 2.  Pain management.  Test findings were discussed with the patient.  She will follow-up with urology.  1330: Patient rechecked prior to discharge.  Pain-free.  Discussed test results.  She understands to get follow-up for the renal cysts.  Final Clinical Impressions(s) / ED Diagnoses   Final diagnoses:  Kidney stone on left side  Renal cyst, left  Hyperglycemia    ED Discharge Orders         Ordered    oxyCODONE-acetaminophen (PERCOCET) 5-325 MG tablet  Every 4 hours PRN     08/03/18 1329    ondansetron (ZOFRAN) 8 MG tablet  Every 4 hours PRN     08/03/18 1329    tamsulosin (FLOMAX) 0.4 MG CAPS capsule  Daily     08/03/18 1329  Nat Christen, MD 08/03/18 [place]    Nat Christen, MD 08/03/18 1332

## 2018-08-03 NOTE — ED Notes (Signed)
Pt is aware a urine sample is needed, purewick is in place. Call bell at bedside.

## 2018-08-03 NOTE — ED Notes (Signed)
Pt husband called and given update.

## 2018-08-03 NOTE — Discharge Instructions (Addendum)
Take diphenhydramine (Benadryl) as needed for itching.  Take famotidine (Pepcid AC) twice a day. This is another type of antihistamine, and will make the Benadryl work better.  Return if you develop any difficulty breathing or swallowing.

## 2018-08-03 NOTE — ED Triage Notes (Signed)
Pt c/o red rash all over body; pt states she has not taken any of the medications she was prescribed when she left earlier today; pt had hamburger for dinner; pt took 2 benadryl at 8am;

## 2018-08-03 NOTE — Discharge Instructions (Addendum)
CT scan shows a 2 to 3 mm kidney stone which is almost in your bladder.  Additionally, you have 2 cysts noted on your left kidney.  This will need follow-up with a urologist.  Phone number given.  Your glucose or sugar was slightly elevated today (148).  This can be monitored as an outpatient.  Prescriptions called into your pharmacy.

## 2018-08-03 NOTE — ED Triage Notes (Signed)
PT brought in by [facility] from home today c/o left sided abdominal pain that started suddenly this am around 0700 with constant nausea and 2 episodes of vomiting. PT denies any urinary symptoms and states had normal BM yesterday.

## 2018-08-03 NOTE — ED Notes (Signed)
Pt is aware a urine sample is needed. 

## 2018-08-04 NOTE — ED Provider Notes (Signed)
[facility] EMERGENCY DEPARTMENT Provider Note   CSN: [id number] Arrival date & time: 08/03/18  2131     History   Chief Complaint Chief Complaint  Patient presents with  . Rash    HPI Barbara [name] is a 73 y.o. female.   The history is provided by the patient.  Rash As she has history of hyperlipidemia was in the ED earlier today for kidney stones.  This afternoon, she started breaking out in a generalized rash which is very pruritic.  There is is no difficulty breathing or swallowing.  She did receive some medication when she was in the ED, but is otherwise not on any new medications and has had no new exposures.  She denies having any pain from the kidney stone.  She did receive prescriptions, but had not taken any of them yet.  She took diphenhydramine at home which did help with the itching, but the rash did not change.  Past Medical History:  Diagnosis Date  . Anxiety   . Asthma   . Bladder spasms   . DDD (degenerative disc disease)   . GERD (gastroesophageal reflux disease)   . Hypercholesteremia   . Sciatic nerve disease   . Sleep apnea     Patient Active Problem List   Diagnosis Date Noted  . Hx of colonic polyps 10/30/2010  . GERD (gastroesophageal reflux disease) 10/30/2010  . Constipation 10/30/2010    Past Surgical History:  Procedure Laterality Date  . ABDOMINAL HYSTERECTOMY    . BREAST BIOPSY     "precancer"-left  . CATARACT EXTRACTION W/PHACO Left 08/04/2016   Procedure: CATARACT EXTRACTION PHACO AND INTRAOCULAR LENS PLACEMENT (IOC);  Surgeon: Tonny Branch, MD;  Location: [facility];  Service: Ophthalmology;  Laterality: Left;  CDE: 5.48  . CATARACT EXTRACTION W/PHACO Right 08/18/2016   Procedure: CATARACT EXTRACTION PHACO AND INTRAOCULAR LENS PLACEMENT RIGHT EYE;  Surgeon: Tonny Branch, MD;  Location: [facility];  Service: Ophthalmology;  Laterality: Right;  CDE: 8.30   . COLONOSCOPY     12 years ago, Dr. Gala Romney, polyp  . complete hysterectomy  2000   fibroids  . TONSILLECTOMY    . TUBAL LIGATION       OB History    Gravida      Para      Term      Preterm      AB      Living  3     SAB      TAB      Ectopic      Multiple      Live Births               Home Medications    Prior to Admission medications   Medication Sig Start Date End Date Taking? Authorizing Provider  ADVAIR DISKUS 250-50 MCG/DOSE AEPB Inhale 1 puff into the lungs 2 (two) times daily. 05/23/16   [provider]  albuterol (PROVENTIL HFA;VENTOLIN HFA) 108 (90 BASE) MCG/ACT inhaler Inhale 2 puffs into the lungs every 6 (six) hours as needed for wheezing or shortness of breath.     [provider]  atorvastatin (LIPITOR) 40 MG tablet Take 40 mg by mouth every other day.     [provider]  celecoxib (CELEBREX) 200 MG capsule Take 200 mg by mouth every other day.     [provider]  cyclobenzaprine (FLEXERIL) 10 MG tablet Take 10 mg by mouth 3 (three) times daily as needed. Sciatic nerve  [provider]  doxepin (SINEQUAN) 10 MG capsule Take 1 capsule (10 mg total) by mouth 3 (three) times daily. 08/03/18   Dione BoozeGlick, Yaneth Fairbairn, MD  omeprazole (PRILOSEC) 20 MG capsule Take 20 mg by mouth daily.      [provider]  ondansetron (ZOFRAN) 8 MG tablet Take 1 tablet (8 mg total) by mouth every 4 (four) hours as needed. 08/03/18   Donnetta Hutchingook, Brian, MD  oxyCODONE-acetaminophen (PERCOCET) 5-325 MG tablet Take 1 tablet by mouth every 4 (four) hours as needed. 08/03/18   Donnetta Hutchingook, Brian, MD  polyethylene glycol [facility](MIRALAX / Ethelene HalGLYCOLAX) packet Take 17 g by mouth daily as needed for moderate constipation.    [provider]  predniSONE (DELTASONE) 50 MG tablet Take 1 tablet (50 mg total) by mouth daily. 08/03/18   Dione BoozeGlick, Banyan Goodchild, MD  Probiotic Product (DIGESTIVE ADVANTAGE GUMMIES PO) Take 2 each by mouth daily.    [provider]  tamsulosin (FLOMAX) 0.4 MG CAPS capsule Take 1 capsule (0.4 mg total) by mouth daily.  08/03/18   Donnetta Hutchingook, Brian, MD  tolterodine (DETROL LA) 4 MG 24 hr capsule Take 4 mg by mouth daily.      [provider]  venlafaxine (EFFEXOR-XR) 150 MG 24 hr capsule Take 150 mg by mouth daily.      [provider]    Family History Family History  Problem Relation Age of Onset  . Colon cancer Maternal Grandmother   . Breast cancer Mother   . Breast cancer Maternal Aunt   . Heart disease Other        father, brother  . Lung cancer Maternal Grandfather   . Inflammatory bowel disease Son        ?crohn's, [facility] on Remicade  . GI problems Brother        Bowel perforation  . Ovarian cancer Neg Hx   . Anesthesia problems Neg Hx   . Hypotension Neg Hx   . Malignant hyperthermia Neg Hx   . Pseudochol deficiency Neg Hx     Social History Social History   Tobacco Use  . Smoking status: Never Smoker  . Smokeless tobacco: Never Used  Substance Use Topics  . Alcohol use: No  . Drug use: No     Allergies   Codeine and Penicillins   Review of Systems Review of Systems  Skin: Positive for rash.  All other systems reviewed and are negative.    Physical Exam Updated Vital Signs BP (!) 149/80 (BP Location: Right Arm)   Pulse 82   Temp 97.7 F (36.5 C) (Oral)   Resp 18   Ht 5' (1.524 m)   Wt 99.8 kg   SpO2 97%   BMI 42.97 kg/m   Physical Exam Vitals signs and nursing note reviewed.    73 year old female, resting comfortably and in no acute distress. Vital signs are significant for elevated blood pressure. Oxygen saturation is 97%, which is normal. Head is normocephalic and atraumatic. PERRLA, EOMI. Oropharynx is clear.  There is no edema of the uvula.  There is no pooling of secretions.  Phonation is normal. Neck is nontender and supple without adenopathy or JVD. Back is nontender and there is no CVA tenderness. Lungs are clear without rales, wheezes, or rhonchi. Chest is nontender. Heart has regular rate and rhythm without murmur. Abdomen  is soft, flat, nontender without masses or hepatosplenomegaly and peristalsis is normoactive. Extremities have no cyanosis or edema, full range of motion is present. Skin:  Urticarial rash which is present mainly over the trunk.. Neurologic: Mental status is normal, cranial nerves are intact, there are no motor or sensory deficits.  ED Treatments / Results   Procedures Procedures  Medications Ordered in ED Medications  predniSONE (DELTASONE) tablet 60 mg (has no administration in time range)  doxepin (SINEQUAN) capsule 10 mg (has no administration in time range)  famotidine (PEPCID) tablet 20 mg (has no administration in time range)     Initial Impression / Assessment and Plan / ED Course  I have reviewed the triage vital signs and the nursing notes.  Urticaria, cause is unclear.  Old records are reviewed confirming ED visit for kidney stone earlier today.  She received ketorolac, fentanyl, ondansetron while in the ED.  She is given a dose of doxepin, prednisone, famotidine and is discharged with prescriptions for doxepin and prednisone.  Advised to continue using diphenhydramine and famotidine as needed.  Return precautions discussed.  Final Clinical Impressions(s) / ED Diagnoses   Final diagnoses:  Urticaria    ED Discharge Orders         Ordered    doxepin (SINEQUAN) 10 MG capsule  3 times daily     08/03/18 2357    predniSONE (DELTASONE) 50 MG tablet  Daily     08/03/18 2357           Dione BoozeGlick, Arrabella Westerman, MD 08/04/18 0007

## 2018-12-20 ENCOUNTER — Emergency Department (HOSPITAL_COMMUNITY)
Admission: EM | Admit: 2018-12-20 | Discharge: 2018-12-20 | Disposition: A | Discharge status: Home / Self Care | Payer: Medicare Other | Attending: Emergency Medicine | Admitting: Emergency Medicine

## 2018-12-20 ENCOUNTER — Encounter (HOSPITAL_COMMUNITY): Payer: Self-pay | Admitting: Emergency Medicine

## 2018-12-20 ENCOUNTER — Other Ambulatory Visit: Payer: Self-pay

## 2018-12-20 DIAGNOSIS — Z79899 Other long term (current) drug therapy: Secondary | ICD-10-CM | POA: Insufficient documentation

## 2018-12-20 DIAGNOSIS — J45909 Unspecified asthma, uncomplicated: Secondary | ICD-10-CM | POA: Insufficient documentation

## 2018-12-20 DIAGNOSIS — L509 Urticaria, unspecified: Secondary | ICD-10-CM | POA: Diagnosis not present

## 2018-12-20 DIAGNOSIS — R21 Rash and other nonspecific skin eruption: Secondary | ICD-10-CM | POA: Diagnosis present

## 2018-12-20 MED ORDER — DEXAMETHASONE SODIUM PHOSPHATE 10 MG/ML IJ SOLN
10.0000 mg | Freq: Once | INTRAMUSCULAR | Status: AC
  Administered 2018-12-20: 02:00:00 10 mg via INTRAVENOUS
  Filled 2018-12-20: qty 1

## 2018-12-20 MED ORDER — FAMOTIDINE IN NACL 20-0.9 MG/50ML-% IV SOLN
20.0000 mg | Freq: Once | INTRAVENOUS | Status: AC
  Administered 2018-12-20: 02:00:00 20 mg via INTRAVENOUS
  Filled 2018-12-20: qty 50

## 2018-12-20 MED ORDER — DIPHENHYDRAMINE HCL 50 MG/ML IJ SOLN
25.0000 mg | Freq: Once | INTRAMUSCULAR | Status: AC
  Administered 2018-12-20: 02:00:00 25 mg via INTRAVENOUS
  Filled 2018-12-20: qty 1

## 2018-12-20 NOTE — ED Triage Notes (Addendum)
Pt states she began itching around 0040 tonight. Pt denies new soaps/detergents. Denies SOB, throat, tongue, and lip swelling. Pt took 2 benadryl PTA.

## 2018-12-20 NOTE — Discharge Instructions (Signed)
Take Benadryl every 6 hours for the next 1 to 2 days then as needed.  Follow-up with your doctor for referral to allergy specialist.

## 2018-12-20 NOTE — ED Provider Notes (Signed)
[facility] EMERGENCY DEPARTMENT Provider Note   CSN: [id number] Arrival date & time: 12/20/18  0118     History   Chief Complaint Chief Complaint  Patient presents with  . Allergic Reaction    HPI Barbara [name] is a 73 y.o. female.     Patient presents to the ER for evaluation of itchy rash.  Patient reports that symptoms began approximately an hour ago.  She started noticing itching on her legs and then all over her body.  She took 2 Benadryl tablets without relief.  She has not had any tongue swelling, throat swelling, difficulty swallowing, difficulty breathing.  Denies nausea, vomiting, abdominal pain.  No weakness, dizziness or passing out.  She has had similar rashes in the past after eating various foods but has never had formal testing and is unaware of any specific allergy that causes the reactions.     Past Medical History:  Diagnosis Date  . Anxiety   . Asthma   . Bladder spasms   . DDD (degenerative disc disease)   . GERD (gastroesophageal reflux disease)   . Hypercholesteremia   . Sciatic nerve disease   . Sleep apnea     Patient Active Problem List   Diagnosis Date Noted  . Hx of colonic polyps 10/30/2010  . GERD (gastroesophageal reflux disease) 10/30/2010  . Constipation 10/30/2010    Past Surgical History:  Procedure Laterality Date  . ABDOMINAL HYSTERECTOMY    . BREAST BIOPSY     "precancer"-left  . CATARACT EXTRACTION W/PHACO Left 08/04/2016   Procedure: CATARACT EXTRACTION PHACO AND INTRAOCULAR LENS PLACEMENT (IOC);  Surgeon: Tonny Branch, MD;  Location: [facility];  Service: Ophthalmology;  Laterality: Left;  CDE: 5.48  . CATARACT EXTRACTION W/PHACO Right 08/18/2016   Procedure: CATARACT EXTRACTION PHACO AND INTRAOCULAR LENS PLACEMENT RIGHT EYE;  Surgeon: Tonny Branch, MD;  Location: [facility];  Service: Ophthalmology;  Laterality: Right;  CDE: 8.30   . COLONOSCOPY     12 years ago, Dr. Gala Romney, polyp  . complete hysterectomy  2000   fibroids  .  TONSILLECTOMY    . TUBAL LIGATION       OB History    Gravida      Para      Term      Preterm      AB      Living  3     SAB      TAB      Ectopic      Multiple      Live Births               Home Medications    Prior to Admission medications   Medication Sig Start Date End Date Taking? Authorizing Provider  ADVAIR DISKUS 250-50 MCG/DOSE AEPB Inhale 1 puff into the lungs 2 (two) times daily. 05/23/16   [provider]  albuterol (PROVENTIL HFA;VENTOLIN HFA) 108 (90 BASE) MCG/ACT inhaler Inhale 2 puffs into the lungs every 6 (six) hours as needed for wheezing or shortness of breath.     [provider]  atorvastatin (LIPITOR) 40 MG tablet Take 40 mg by mouth every other day.     [provider]  celecoxib (CELEBREX) 200 MG capsule Take 200 mg by mouth every other day.     [provider]  cyclobenzaprine (FLEXERIL) 10 MG tablet Take 10 mg by mouth 3 (three) times daily as needed. Sciatic nerve    [provider]  doxepin (SINEQUAN) 10  MG capsule Take 1 capsule (10 mg total) by mouth 3 (three) times daily. 08/03/18   Dione Booze, MD  omeprazole (PRILOSEC) 20 MG capsule Take 20 mg by mouth daily.      [provider]  ondansetron (ZOFRAN) 8 MG tablet Take 1 tablet (8 mg total) by mouth every 4 (four) hours as needed. 08/03/18   Donnetta Hutching, MD  oxyCODONE-acetaminophen (PERCOCET) 5-325 MG tablet Take 1 tablet by mouth every 4 (four) hours as needed. 08/03/18   Donnetta Hutching, MD  polyethylene glycol [facility] / Ethelene Hal) packet Take 17 g by mouth daily as needed for moderate constipation.    [provider]  predniSONE (DELTASONE) 50 MG tablet Take 1 tablet (50 mg total) by mouth daily. 08/03/18   Dione Booze, MD  Probiotic Product (DIGESTIVE ADVANTAGE GUMMIES PO) Take 2 each by mouth daily.    [provider]  tamsulosin (FLOMAX) 0.4 MG CAPS capsule Take 1 capsule (0.4 mg total) by mouth daily. 08/03/18   Donnetta Hutching, MD  tolterodine (DETROL LA) 4 MG 24 hr capsule Take 4 mg by mouth daily.      [provider]  venlafaxine (EFFEXOR-XR) 150 MG 24 hr capsule Take 150 mg by mouth daily.      [provider]    Family History Family History  Problem Relation Age of Onset  . Colon cancer Maternal Grandmother   . Breast cancer Mother   . Breast cancer Maternal Aunt   . Heart disease Other        father, brother  . Lung cancer Maternal Grandfather   . Inflammatory bowel disease Son        ?crohn's, followed at [facility] on Remicade  . GI problems Brother        Bowel perforation  . Ovarian cancer Neg Hx   . Anesthesia problems Neg Hx   . Hypotension Neg Hx   . Malignant hyperthermia Neg Hx   . Pseudochol deficiency Neg Hx     Social History Social History   Tobacco Use  . Smoking status: Never Smoker  . Smokeless tobacco: Never Used  Substance Use Topics  . Alcohol use: No  . Drug use: No     Allergies   Codeine and Penicillins   Review of Systems Review of Systems  Skin: Positive for rash.  All other systems reviewed and are negative.    Physical Exam Updated Vital Signs BP (!) 147/80   Pulse (!) 59   Temp 99.1 F (37.3 C) (Oral)   Resp 18   SpO2 97%   Physical Exam Vitals signs and nursing note reviewed.  Constitutional:      General: She is not in acute distress.    Appearance: Normal appearance. She is well-developed.  HENT:     Head: Normocephalic and atraumatic.     Right Ear: Hearing normal.     Left Ear: Hearing normal.     Nose: Nose normal.  Eyes:     Conjunctiva/sclera: Conjunctivae normal.     Pupils: Pupils are equal, round, and reactive to light.  Neck:     Musculoskeletal: Normal range of motion and neck supple.  Cardiovascular:     Rate and Rhythm: Regular rhythm.     Heart sounds: S1 normal and S2 normal. No murmur. No friction rub. No gallop.   Pulmonary:     Effort: Pulmonary effort is normal. No respiratory distress.      Breath sounds: Normal breath sounds.  Chest:  Chest wall: No tenderness.  Abdominal:     General: Bowel sounds are normal.     Palpations: Abdomen is soft.     Tenderness: There is no abdominal tenderness. There is no guarding or rebound. Negative signs include Murphy's sign and McBurney's sign.     Hernia: No hernia is present.  Musculoskeletal: Normal range of motion.  Skin:    General: Skin is warm and dry.     Findings: Rash (Urticaria) present.  Neurological:     Mental Status: She is alert and oriented to person, place, and time.     GCS: GCS eye subscore is 4. GCS verbal subscore is 5. GCS motor subscore is 6.     Cranial Nerves: No cranial nerve deficit.     Sensory: No sensory deficit.     Coordination: Coordination normal.  Psychiatric:        Speech: Speech normal.        Behavior: Behavior normal.        Thought Content: Thought content normal.      ED Treatments / Results  Labs (all labs ordered are listed, but only abnormal results are displayed) Labs Reviewed - No data to display  EKG None  Radiology No results found.  Procedures Procedures (including critical care time)  Medications Ordered in ED Medications  diphenhydrAMINE (BENADRYL) injection 25 mg (25 mg Intravenous Given 12/20/18 0202)  famotidine (PEPCID) IVPB 20 mg premix (0 mg Intravenous Stopped 12/20/18 0226)  dexamethasone (DECADRON) injection 10 mg (10 mg Intravenous Given 12/20/18 0202)     Initial Impression / Assessment and Plan / ED Course  I have reviewed the triage vital signs and the nursing notes.  Pertinent labs & imaging results that were available during my care of the patient were reviewed by me and considered in my medical decision making (see chart for details).        Patient presents to the ER for evaluation of diffuse urticaria.  No evidence of anaphylaxis, vital signs are unremarkable.  Patient does not have any shortness of breath, oropharyngeal involvement.   She was treated with additional Benadryl, Pepcid, Decadron IV and monitored.  She has done well, continue treatment for acute urticaria as an outpatient, recommend follow-up with PCP and allergist.  Final Clinical Impressions(s) / ED Diagnoses   Final diagnoses:  Hives    ED Discharge Orders    None       Gilda CreasePollina, Perlita Forbush J, MD 12/20/18 [phone]

## 2020-01-05 DIAGNOSIS — M65311 Trigger thumb, right thumb: Secondary | ICD-10-CM | POA: Insufficient documentation

## 2020-11-22 IMAGING — CT CT RENAL STONE PROTOCOL
2 of 4 series · 16 of 46 positions shown, 18 images · non-contrast
Comparison: None

CLINICAL DATA: LEFT flank pain question stone disease, acute onset
of pain this morning at 3033 hours with constant nausea and 2
episodes of vomiting, history GERD, hysterectomy, asthma

EXAM:
CT ABDOMEN AND PELVIS WITHOUT CONTRAST
TECHNIQUE: Multidetector CT imaging of the abdomen and pelvis was performed
following the standard protocol without IV contrast. Sagittal and
coronal MPR images reconstructed from axial data set. No oral
contrast administered.

[Series 2: axial st · axial · 0.80mm/px · z∈[-504,-159]mm · 13 of 79 slices shown, 15 images]
[im 5/79  soft-tissue]
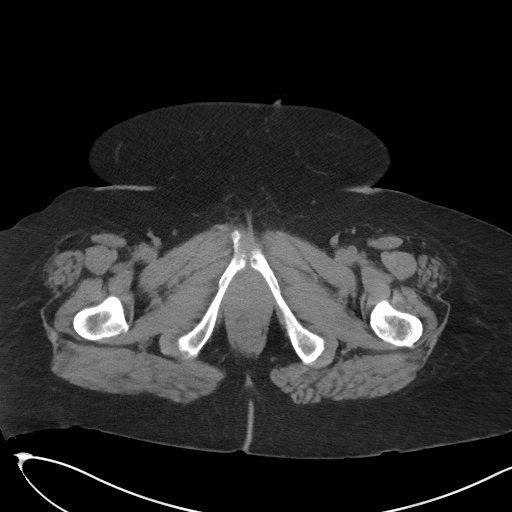
[im 5/79  bone]
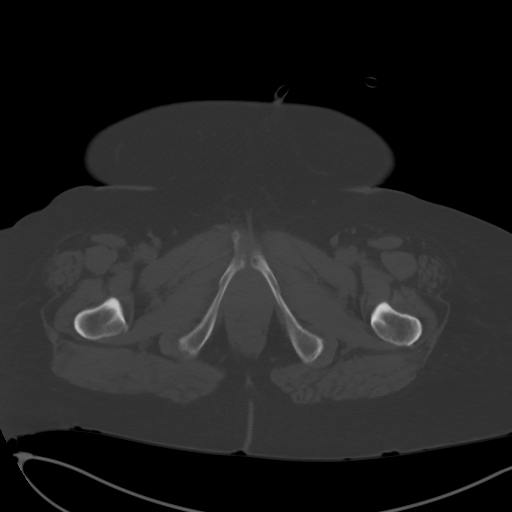
[im 10/79  soft-tissue]
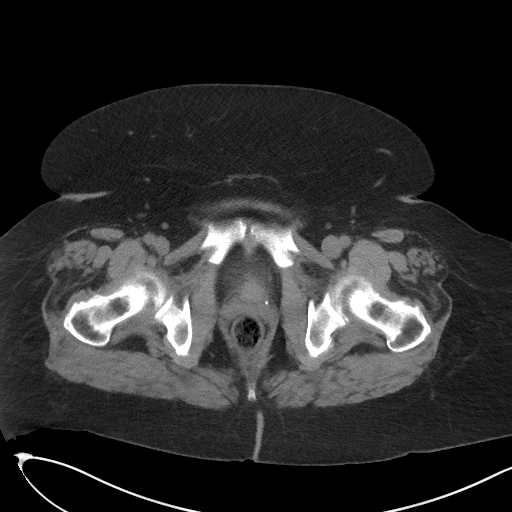
[im 19/79  soft-tissue]
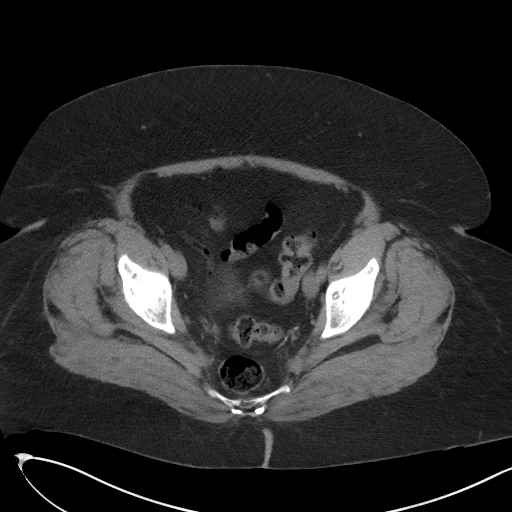
[im 23/79  soft-tissue]
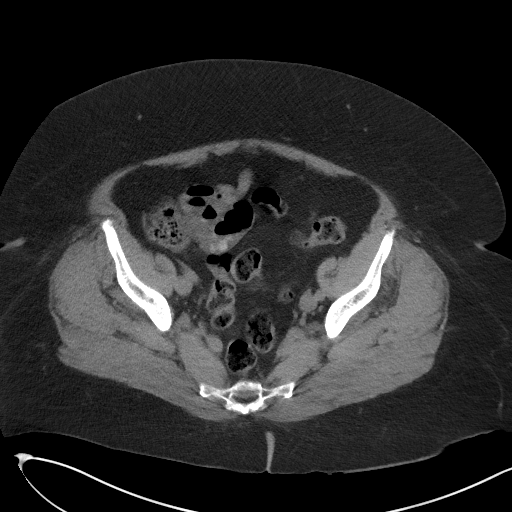
[im 28/79  soft-tissue]
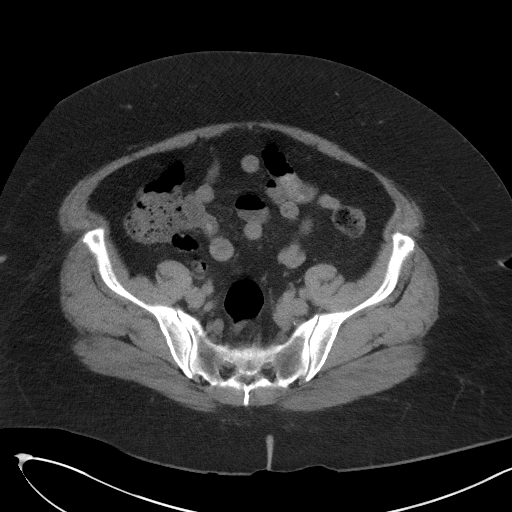
[im 33/79  soft-tissue]
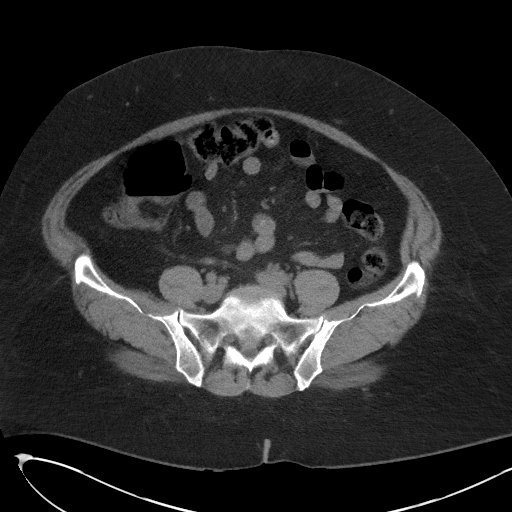
[im 42/79  soft-tissue]
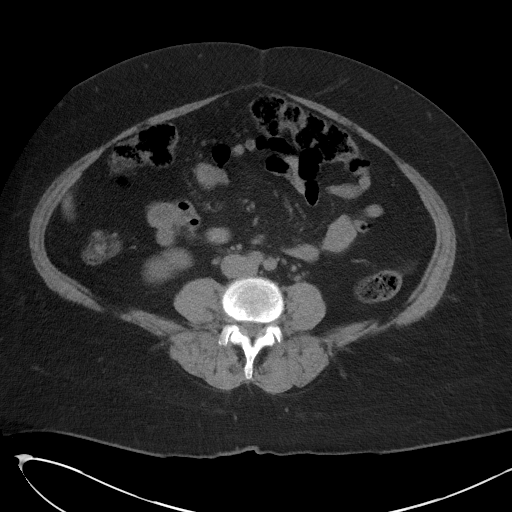
[im 46/79  soft-tissue]
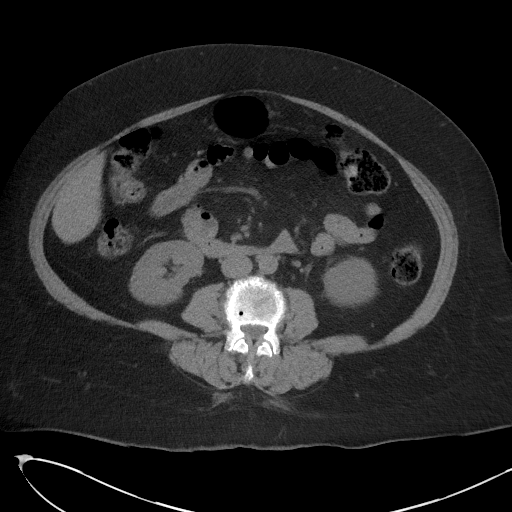
[im 51/79  soft-tissue]
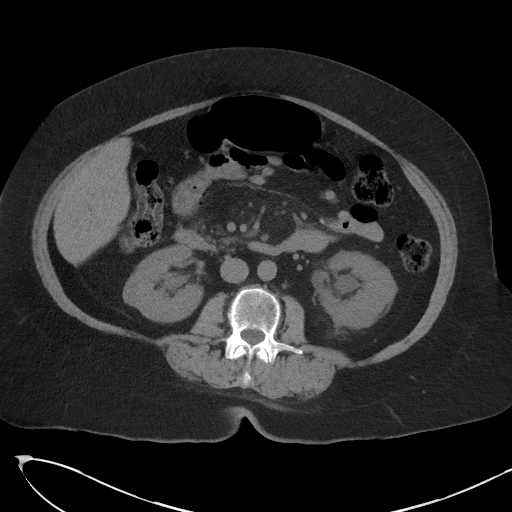
[im 51/79  bone]
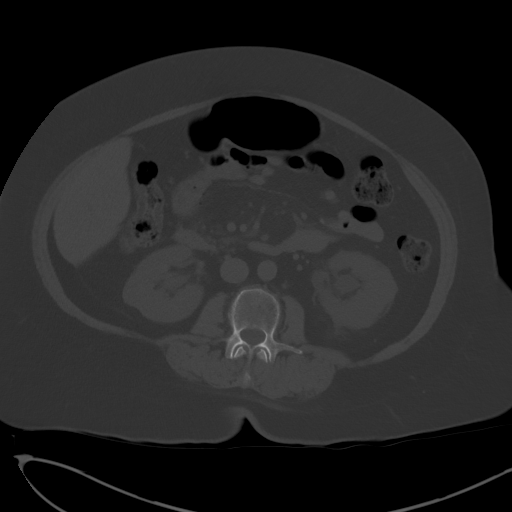
[im 56/79  soft-tissue]
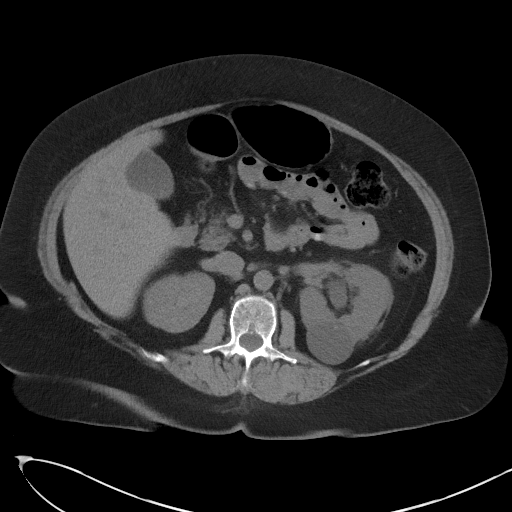
[im 60/79  soft-tissue]
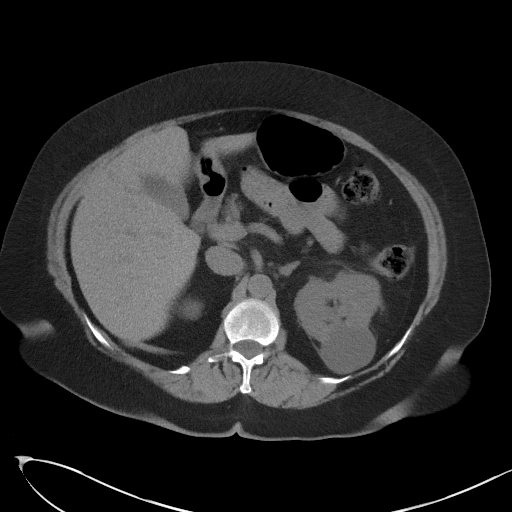
[im 69/79  soft-tissue]
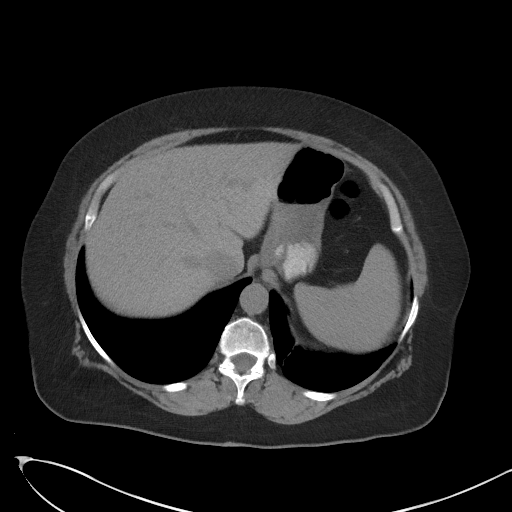
[im 74/79  soft-tissue]
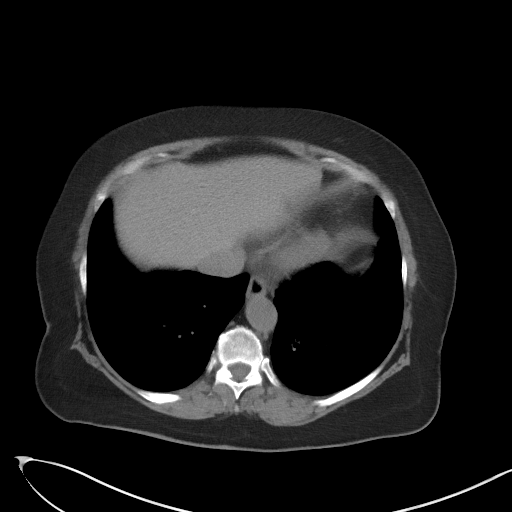

[Series 5: coronal st · coronal · 0.76mm/px · 3 of 119 slices shown]
[im 40/119  soft-tissue]
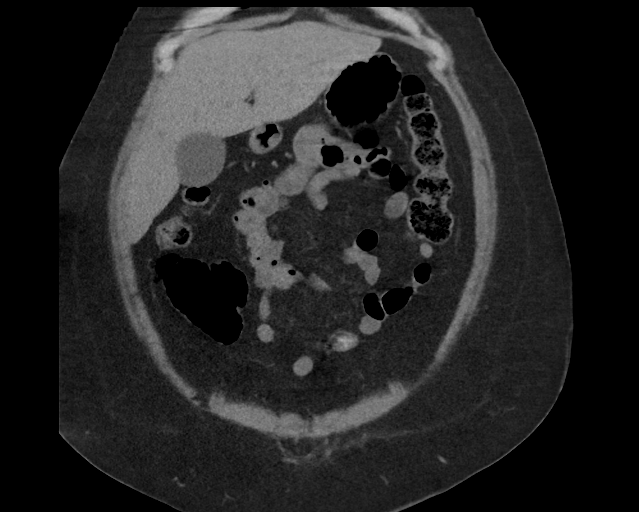
[im 53/119  soft-tissue]
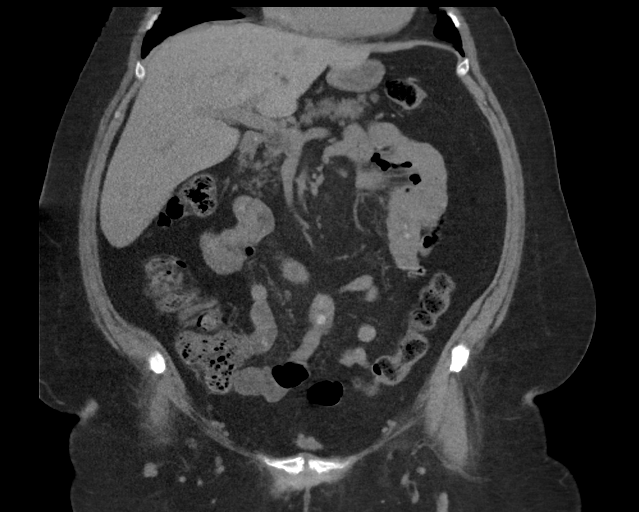
[im 66/119  soft-tissue]
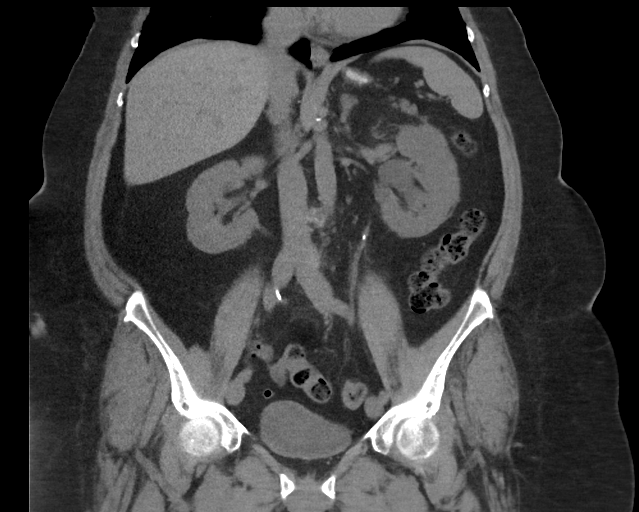

[16 of 46 positions shown; findings below may reference images not displayed]

FINDINGS: Lower chest: Minimal LEFT basilar atelectasis

Hepatobiliary: Liver and gallbladder normal appearance

Pancreas: Normal appearance

Spleen: Normal appearance

Adrenals/Urinary Tract: Adrenal glands normal appearance.
Complicated cysts at LEFT kidney 4.6 x 3.9 cm image 19 and 3.9 x
cm image 25, post complicated by mild wall calcification. LEFT
hydronephrosis is identified secondary to a 3 mm LEFT ureteropelvic
junction calculus. No RIGHT hydronephrosis. Ureters and bladder
unremarkable.

Stomach/Bowel: Normal appendix. Stomach and bowel loops normal
appearance

Vascular/Lymphatic: Aorta normal caliber. Minimal atherosclerotic
calcifications of the iliac arteries. No adenopathy.

Reproductive: Uterus surgically absent. Nonvisualization of ovaries.

Other: No free air or free fluid.  No inflammatory process.

Musculoskeletal: Osseous demineralization with degenerative disc
disease changes at L3-L4.
IMPRESSION: LEFT hydronephrosis secondary to a 3 mm LEFT UPJ calculus.

2 complicated cysts of the LEFT kidney containing mural
calcification, lesions measuring 4.6 cm and 3.9 cm in greatest
dimension; follow-up sonographic characterization recommended when
patient's clinical condition permits adequate assessment.

## 2021-04-23 DIAGNOSIS — G5602 Carpal tunnel syndrome, left upper limb: Secondary | ICD-10-CM | POA: Insufficient documentation

## 2022-01-31 DIAGNOSIS — M1811 Unilateral primary osteoarthritis of first carpometacarpal joint, right hand: Secondary | ICD-10-CM | POA: Diagnosis not present

## 2022-01-31 DIAGNOSIS — G8918 Other acute postprocedural pain: Secondary | ICD-10-CM | POA: Diagnosis not present

## 2022-01-31 DIAGNOSIS — M13841 Other specified arthritis, right hand: Secondary | ICD-10-CM | POA: Diagnosis not present

## 2022-02-13 DIAGNOSIS — Z4789 Encounter for other orthopedic aftercare: Secondary | ICD-10-CM | POA: Diagnosis not present

## 2022-02-27 DIAGNOSIS — M25641 Stiffness of right hand, not elsewhere classified: Secondary | ICD-10-CM | POA: Diagnosis not present

## 2022-02-27 DIAGNOSIS — Z4789 Encounter for other orthopedic aftercare: Secondary | ICD-10-CM | POA: Diagnosis not present

## 2022-03-05 DIAGNOSIS — M79641 Pain in right hand: Secondary | ICD-10-CM | POA: Diagnosis not present

## 2022-03-05 DIAGNOSIS — Z4789 Encounter for other orthopedic aftercare: Secondary | ICD-10-CM | POA: Diagnosis not present

## 2022-03-05 DIAGNOSIS — R202 Paresthesia of skin: Secondary | ICD-10-CM | POA: Diagnosis not present

## 2022-03-05 DIAGNOSIS — R531 Weakness: Secondary | ICD-10-CM | POA: Diagnosis not present

## 2022-03-05 DIAGNOSIS — M25441 Effusion, right hand: Secondary | ICD-10-CM | POA: Diagnosis not present

## 2022-03-05 DIAGNOSIS — M25431 Effusion, right wrist: Secondary | ICD-10-CM | POA: Diagnosis not present

## 2022-03-05 DIAGNOSIS — M25531 Pain in right wrist: Secondary | ICD-10-CM | POA: Diagnosis not present

## 2022-03-05 DIAGNOSIS — M25641 Stiffness of right hand, not elsewhere classified: Secondary | ICD-10-CM | POA: Diagnosis not present

## 2022-03-05 DIAGNOSIS — M25631 Stiffness of right wrist, not elsewhere classified: Secondary | ICD-10-CM | POA: Diagnosis not present

## 2022-03-06 DIAGNOSIS — M25641 Stiffness of right hand, not elsewhere classified: Secondary | ICD-10-CM | POA: Diagnosis not present

## 2022-03-06 DIAGNOSIS — M79641 Pain in right hand: Secondary | ICD-10-CM | POA: Diagnosis not present

## 2022-03-06 DIAGNOSIS — M25531 Pain in right wrist: Secondary | ICD-10-CM | POA: Diagnosis not present

## 2022-03-06 DIAGNOSIS — R531 Weakness: Secondary | ICD-10-CM | POA: Diagnosis not present

## 2022-03-06 DIAGNOSIS — M25631 Stiffness of right wrist, not elsewhere classified: Secondary | ICD-10-CM | POA: Diagnosis not present

## 2022-03-06 DIAGNOSIS — M25431 Effusion, right wrist: Secondary | ICD-10-CM | POA: Diagnosis not present

## 2022-03-06 DIAGNOSIS — R202 Paresthesia of skin: Secondary | ICD-10-CM | POA: Diagnosis not present

## 2022-03-06 DIAGNOSIS — Z4789 Encounter for other orthopedic aftercare: Secondary | ICD-10-CM | POA: Diagnosis not present

## 2022-03-06 DIAGNOSIS — M25441 Effusion, right hand: Secondary | ICD-10-CM | POA: Diagnosis not present

## 2022-03-12 DIAGNOSIS — R202 Paresthesia of skin: Secondary | ICD-10-CM | POA: Diagnosis not present

## 2022-03-12 DIAGNOSIS — M25531 Pain in right wrist: Secondary | ICD-10-CM | POA: Diagnosis not present

## 2022-03-12 DIAGNOSIS — M25641 Stiffness of right hand, not elsewhere classified: Secondary | ICD-10-CM | POA: Diagnosis not present

## 2022-03-12 DIAGNOSIS — R531 Weakness: Secondary | ICD-10-CM | POA: Diagnosis not present

## 2022-03-12 DIAGNOSIS — M79641 Pain in right hand: Secondary | ICD-10-CM | POA: Diagnosis not present

## 2022-03-12 DIAGNOSIS — M25631 Stiffness of right wrist, not elsewhere classified: Secondary | ICD-10-CM | POA: Diagnosis not present

## 2022-03-12 DIAGNOSIS — M25431 Effusion, right wrist: Secondary | ICD-10-CM | POA: Diagnosis not present

## 2022-03-12 DIAGNOSIS — Z4789 Encounter for other orthopedic aftercare: Secondary | ICD-10-CM | POA: Diagnosis not present

## 2022-03-12 DIAGNOSIS — M25441 Effusion, right hand: Secondary | ICD-10-CM | POA: Diagnosis not present

## 2022-03-13 DIAGNOSIS — M25531 Pain in right wrist: Secondary | ICD-10-CM | POA: Diagnosis not present

## 2022-03-13 DIAGNOSIS — M25431 Effusion, right wrist: Secondary | ICD-10-CM | POA: Diagnosis not present

## 2022-03-13 DIAGNOSIS — R202 Paresthesia of skin: Secondary | ICD-10-CM | POA: Diagnosis not present

## 2022-03-13 DIAGNOSIS — M25641 Stiffness of right hand, not elsewhere classified: Secondary | ICD-10-CM | POA: Diagnosis not present

## 2022-03-13 DIAGNOSIS — Z4789 Encounter for other orthopedic aftercare: Secondary | ICD-10-CM | POA: Diagnosis not present

## 2022-03-13 DIAGNOSIS — M25441 Effusion, right hand: Secondary | ICD-10-CM | POA: Diagnosis not present

## 2022-03-13 DIAGNOSIS — M25631 Stiffness of right wrist, not elsewhere classified: Secondary | ICD-10-CM | POA: Diagnosis not present

## 2022-03-13 DIAGNOSIS — R531 Weakness: Secondary | ICD-10-CM | POA: Diagnosis not present

## 2022-03-13 DIAGNOSIS — M79641 Pain in right hand: Secondary | ICD-10-CM | POA: Diagnosis not present

## 2022-03-17 DIAGNOSIS — R531 Weakness: Secondary | ICD-10-CM | POA: Diagnosis not present

## 2022-03-17 DIAGNOSIS — M79641 Pain in right hand: Secondary | ICD-10-CM | POA: Diagnosis not present

## 2022-03-17 DIAGNOSIS — M25431 Effusion, right wrist: Secondary | ICD-10-CM | POA: Diagnosis not present

## 2022-03-17 DIAGNOSIS — M25441 Effusion, right hand: Secondary | ICD-10-CM | POA: Diagnosis not present

## 2022-03-17 DIAGNOSIS — M25631 Stiffness of right wrist, not elsewhere classified: Secondary | ICD-10-CM | POA: Diagnosis not present

## 2022-03-17 DIAGNOSIS — M25641 Stiffness of right hand, not elsewhere classified: Secondary | ICD-10-CM | POA: Diagnosis not present

## 2022-03-17 DIAGNOSIS — Z4789 Encounter for other orthopedic aftercare: Secondary | ICD-10-CM | POA: Diagnosis not present

## 2022-03-17 DIAGNOSIS — R202 Paresthesia of skin: Secondary | ICD-10-CM | POA: Diagnosis not present

## 2022-03-17 DIAGNOSIS — M25531 Pain in right wrist: Secondary | ICD-10-CM | POA: Diagnosis not present

## 2022-03-20 DIAGNOSIS — R202 Paresthesia of skin: Secondary | ICD-10-CM | POA: Diagnosis not present

## 2022-03-20 DIAGNOSIS — M25631 Stiffness of right wrist, not elsewhere classified: Secondary | ICD-10-CM | POA: Diagnosis not present

## 2022-03-20 DIAGNOSIS — M25531 Pain in right wrist: Secondary | ICD-10-CM | POA: Diagnosis not present

## 2022-03-20 DIAGNOSIS — R531 Weakness: Secondary | ICD-10-CM | POA: Diagnosis not present

## 2022-03-20 DIAGNOSIS — M25431 Effusion, right wrist: Secondary | ICD-10-CM | POA: Diagnosis not present

## 2022-03-20 DIAGNOSIS — M79641 Pain in right hand: Secondary | ICD-10-CM | POA: Diagnosis not present

## 2022-03-20 DIAGNOSIS — Z4789 Encounter for other orthopedic aftercare: Secondary | ICD-10-CM | POA: Diagnosis not present

## 2022-03-20 DIAGNOSIS — M25641 Stiffness of right hand, not elsewhere classified: Secondary | ICD-10-CM | POA: Diagnosis not present

## 2022-03-20 DIAGNOSIS — M25441 Effusion, right hand: Secondary | ICD-10-CM | POA: Diagnosis not present

## 2022-03-24 DIAGNOSIS — Z4789 Encounter for other orthopedic aftercare: Secondary | ICD-10-CM | POA: Diagnosis not present

## 2022-03-24 DIAGNOSIS — R202 Paresthesia of skin: Secondary | ICD-10-CM | POA: Diagnosis not present

## 2022-03-24 DIAGNOSIS — M25641 Stiffness of right hand, not elsewhere classified: Secondary | ICD-10-CM | POA: Diagnosis not present

## 2022-03-24 DIAGNOSIS — M25441 Effusion, right hand: Secondary | ICD-10-CM | POA: Diagnosis not present

## 2022-03-24 DIAGNOSIS — M25631 Stiffness of right wrist, not elsewhere classified: Secondary | ICD-10-CM | POA: Diagnosis not present

## 2022-03-24 DIAGNOSIS — M79641 Pain in right hand: Secondary | ICD-10-CM | POA: Diagnosis not present

## 2022-03-24 DIAGNOSIS — R531 Weakness: Secondary | ICD-10-CM | POA: Diagnosis not present

## 2022-03-24 DIAGNOSIS — M25531 Pain in right wrist: Secondary | ICD-10-CM | POA: Diagnosis not present

## 2022-03-24 DIAGNOSIS — M25431 Effusion, right wrist: Secondary | ICD-10-CM | POA: Diagnosis not present

## 2022-03-27 DIAGNOSIS — M1811 Unilateral primary osteoarthritis of first carpometacarpal joint, right hand: Secondary | ICD-10-CM | POA: Diagnosis not present

## 2022-03-27 DIAGNOSIS — Z4789 Encounter for other orthopedic aftercare: Secondary | ICD-10-CM | POA: Diagnosis not present

## 2022-03-27 DIAGNOSIS — M79641 Pain in right hand: Secondary | ICD-10-CM | POA: Diagnosis not present

## 2022-04-07 DIAGNOSIS — M79641 Pain in right hand: Secondary | ICD-10-CM | POA: Diagnosis not present

## 2022-04-07 DIAGNOSIS — R030 Elevated blood-pressure reading, without diagnosis of hypertension: Secondary | ICD-10-CM | POA: Diagnosis not present

## 2022-04-07 DIAGNOSIS — R531 Weakness: Secondary | ICD-10-CM | POA: Diagnosis not present

## 2022-04-07 DIAGNOSIS — R202 Paresthesia of skin: Secondary | ICD-10-CM | POA: Diagnosis not present

## 2022-04-07 DIAGNOSIS — M25441 Effusion, right hand: Secondary | ICD-10-CM | POA: Diagnosis not present

## 2022-04-07 DIAGNOSIS — M25431 Effusion, right wrist: Secondary | ICD-10-CM | POA: Diagnosis not present

## 2022-04-07 DIAGNOSIS — Z6837 Body mass index (BMI) 37.0-37.9, adult: Secondary | ICD-10-CM | POA: Diagnosis not present

## 2022-04-07 DIAGNOSIS — R059 Cough, unspecified: Secondary | ICD-10-CM | POA: Diagnosis not present

## 2022-04-07 DIAGNOSIS — M25641 Stiffness of right hand, not elsewhere classified: Secondary | ICD-10-CM | POA: Diagnosis not present

## 2022-04-07 DIAGNOSIS — R5383 Other fatigue: Secondary | ICD-10-CM | POA: Diagnosis not present

## 2022-04-07 DIAGNOSIS — M25631 Stiffness of right wrist, not elsewhere classified: Secondary | ICD-10-CM | POA: Diagnosis not present

## 2022-04-07 DIAGNOSIS — Z4789 Encounter for other orthopedic aftercare: Secondary | ICD-10-CM | POA: Diagnosis not present

## 2022-04-07 DIAGNOSIS — M25531 Pain in right wrist: Secondary | ICD-10-CM | POA: Diagnosis not present

## 2022-04-07 DIAGNOSIS — Z20828 Contact with and (suspected) exposure to other viral communicable diseases: Secondary | ICD-10-CM | POA: Diagnosis not present

## 2022-04-14 DIAGNOSIS — M25531 Pain in right wrist: Secondary | ICD-10-CM | POA: Diagnosis not present

## 2022-04-14 DIAGNOSIS — M25431 Effusion, right wrist: Secondary | ICD-10-CM | POA: Diagnosis not present

## 2022-04-14 DIAGNOSIS — R531 Weakness: Secondary | ICD-10-CM | POA: Diagnosis not present

## 2022-04-14 DIAGNOSIS — M25631 Stiffness of right wrist, not elsewhere classified: Secondary | ICD-10-CM | POA: Diagnosis not present

## 2022-04-14 DIAGNOSIS — M25641 Stiffness of right hand, not elsewhere classified: Secondary | ICD-10-CM | POA: Diagnosis not present

## 2022-04-14 DIAGNOSIS — R202 Paresthesia of skin: Secondary | ICD-10-CM | POA: Diagnosis not present

## 2022-04-14 DIAGNOSIS — M79641 Pain in right hand: Secondary | ICD-10-CM | POA: Diagnosis not present

## 2022-04-14 DIAGNOSIS — Z4789 Encounter for other orthopedic aftercare: Secondary | ICD-10-CM | POA: Diagnosis not present

## 2022-04-14 DIAGNOSIS — M25441 Effusion, right hand: Secondary | ICD-10-CM | POA: Diagnosis not present

## 2022-04-23 DIAGNOSIS — M25441 Effusion, right hand: Secondary | ICD-10-CM | POA: Diagnosis not present

## 2022-04-23 DIAGNOSIS — Z4789 Encounter for other orthopedic aftercare: Secondary | ICD-10-CM | POA: Diagnosis not present

## 2022-04-23 DIAGNOSIS — R531 Weakness: Secondary | ICD-10-CM | POA: Diagnosis not present

## 2022-04-23 DIAGNOSIS — M25631 Stiffness of right wrist, not elsewhere classified: Secondary | ICD-10-CM | POA: Diagnosis not present

## 2022-04-23 DIAGNOSIS — M25431 Effusion, right wrist: Secondary | ICD-10-CM | POA: Diagnosis not present

## 2022-04-23 DIAGNOSIS — R202 Paresthesia of skin: Secondary | ICD-10-CM | POA: Diagnosis not present

## 2022-04-23 DIAGNOSIS — M25641 Stiffness of right hand, not elsewhere classified: Secondary | ICD-10-CM | POA: Diagnosis not present

## 2022-04-23 DIAGNOSIS — M25531 Pain in right wrist: Secondary | ICD-10-CM | POA: Diagnosis not present

## 2022-04-23 DIAGNOSIS — M79641 Pain in right hand: Secondary | ICD-10-CM | POA: Diagnosis not present

## 2022-05-10 ENCOUNTER — Other Ambulatory Visit: Payer: Self-pay

## 2022-05-10 ENCOUNTER — Encounter (HOSPITAL_COMMUNITY): Payer: Self-pay

## 2022-05-10 ENCOUNTER — Emergency Department (HOSPITAL_COMMUNITY)
Admission: EM | Admit: 2022-05-10 | Discharge: 2022-05-10 | Disposition: A | Discharge status: Home / Self Care | Payer: Medicare PPO | Attending: Emergency Medicine | Admitting: Emergency Medicine

## 2022-05-10 ENCOUNTER — Emergency Department (HOSPITAL_COMMUNITY): Payer: Medicare PPO

## 2022-05-10 DIAGNOSIS — S52501A Unspecified fracture of the lower end of right radius, initial encounter for closed fracture: Secondary | ICD-10-CM | POA: Diagnosis not present

## 2022-05-10 DIAGNOSIS — S52611A Displaced fracture of right ulna styloid process, initial encounter for closed fracture: Secondary | ICD-10-CM | POA: Insufficient documentation

## 2022-05-10 DIAGNOSIS — S52571A Other intraarticular fracture of lower end of right radius, initial encounter for closed fracture: Secondary | ICD-10-CM | POA: Diagnosis not present

## 2022-05-10 DIAGNOSIS — R9431 Abnormal electrocardiogram [ECG] [EKG]: Secondary | ICD-10-CM | POA: Diagnosis not present

## 2022-05-10 DIAGNOSIS — W010XXA Fall on same level from slipping, tripping and stumbling without subsequent striking against object, initial encounter: Secondary | ICD-10-CM | POA: Insufficient documentation

## 2022-05-10 DIAGNOSIS — S52601A Unspecified fracture of lower end of right ulna, initial encounter for closed fracture: Secondary | ICD-10-CM

## 2022-05-10 DIAGNOSIS — Y9301 Activity, walking, marching and hiking: Secondary | ICD-10-CM | POA: Diagnosis not present

## 2022-05-10 DIAGNOSIS — S6991XA Unspecified injury of right wrist, hand and finger(s), initial encounter: Secondary | ICD-10-CM | POA: Diagnosis present

## 2022-05-10 MED ORDER — OXYCODONE-ACETAMINOPHEN 5-325 MG PO TABS: 1.0000 | ORAL_TABLET | Freq: Four times a day (QID) | ORAL | 0 refills | Status: DC | PRN

## 2022-05-10 MED ORDER — HYDROMORPHONE HCL 1 MG/ML IJ SOLN
1.0000 mg | Freq: Once | INTRAMUSCULAR | Status: AC
  Administered 2022-05-10: 1 mg via INTRAVENOUS
  Filled 2022-05-10: qty 1

## 2022-05-10 MED ORDER — FENTANYL CITRATE PF 50 MCG/ML IJ SOSY
50.0000 ug | PREFILLED_SYRINGE | Freq: Once | INTRAMUSCULAR | Status: AC
  Administered 2022-05-10: 50 ug via INTRAVENOUS
  Filled 2022-05-10: qty 1

## 2022-05-10 NOTE — ED Provider Notes (Signed)
Barbara [name] Provider Note   CSN: [id number] Arrival date & time: 05/10/22  6295     History  Chief Complaint  Patient presents with   Fall    R forearm injury- hx recent surgery on R arm    Barbara [name] is a 77 y.o. female, hx of R forearm surgery w/Dr. Amanda [name], who presents to the ED 2/2 to a FOOSH fall that occurred an hour ago. Notes she was walking and did not pick her feet up completely and tripped and fell on both outstretched hands. Reports R forearm and wrist/hand pain 5 minutes after fall. Did not hit head. No blood thinner use.     Home Medications Prior to Admission medications   Medication Sig Start Date End Date Taking? Authorizing Provider  oxyCODONE-acetaminophen (PERCOCET/ROXICET) 5-325 MG tablet Take 1 tablet by mouth every 6 (six) hours as needed for severe pain. 05/10/22  Yes Barbara [name]  oxyCODONE-acetaminophen (PERCOCET/ROXICET) 5-325 MG tablet Take 1 tablet by mouth every 6 (six) hours as needed for severe pain. 05/10/22  Yes Barbara [name]  ADVAIR DISKUS 250-50 MCG/DOSE AEPB Inhale 1 puff into the lungs 2 (two) times daily. 05/23/16   Provider, Historical, [name]  albuterol (PROVENTIL HFA;VENTOLIN HFA) 108 (90 BASE) MCG/ACT inhaler Inhale 2 puffs into the lungs every 6 (six) hours as needed for wheezing or shortness of breath.     Provider, Historical, [name]  atorvastatin (LIPITOR) 40 MG tablet Take 40 mg by mouth every other day.     Provider, Historical, [name]  celecoxib (CELEBREX) 200 MG capsule Take 200 mg by mouth every other day.     Provider, Historical, [name]  cyclobenzaprine (FLEXERIL) 10 MG tablet Take 10 mg by mouth 3 (three) times daily as needed. Sciatic nerve    Provider, Historical, [name]  doxepin (SINEQUAN) 10 MG capsule Take 1 capsule (10 mg total) by mouth 3 (three) times daily. 08/03/18   Barbara [name]  omeprazole (PRILOSEC) 20 MG capsule Take 20 mg by mouth daily.      Provider, Historical, [name]   ondansetron (ZOFRAN) 8 MG tablet Take 1 tablet (8 mg total) by mouth every 4 (four) hours as needed. 08/03/18   Barbara [name]  polyethylene glycol Barbara [name] Take 17 g by mouth daily as needed for moderate constipation.    Provider, Historical, [name]  predniSONE (DELTASONE) 50 MG tablet Take 1 tablet (50 mg total) by mouth daily. 08/03/18   Barbara [name]  Probiotic Product (DIGESTIVE ADVANTAGE GUMMIES PO) Take 2 each by mouth daily.    Provider, Historical, [name]  tamsulosin (FLOMAX) 0.4 MG CAPS capsule Take 1 capsule (0.4 mg total) by mouth daily. 08/03/18   Barbara [name]  tolterodine (DETROL LA) 4 MG 24 hr capsule Take 4 mg by mouth daily.      Provider, Historical, [name]  venlafaxine (EFFEXOR-XR) 150 MG 24 hr capsule Take 150 mg by mouth daily.      Provider, Historical, [name]      Allergies    Codeine and Penicillins    Review of Systems   Review of Systems  Musculoskeletal:  Negative for back pain and neck pain.       +R arm pain    Physical Exam Updated Vital Signs BP (!) 169/82 (BP Location: Left Arm)   Pulse (!) 103   Temp 98.5 F (36.9 C) (Oral)   Resp 18   Ht 5' (1.524 m)  Wt 99.8 kg   SpO2 98%   BMI 42.97 kg/m  Physical Exam Vitals and nursing note reviewed.  Constitutional:      General: She is not in acute distress.    Appearance: She is well-developed.  HENT:     Head: Normocephalic and atraumatic.  Eyes:     General:        Right eye: No discharge.        Left eye: No discharge.     Conjunctiva/sclera: Conjunctivae normal.  Pulmonary:     Effort: No respiratory distress.  Musculoskeletal:     Comments: Right arm/ hand: TTP of distal ulna and ttp of 1st metacarpal. Radial pulses present. Grip strength intact. Unable to flex, extend, ulnar and radial deviate wrist. Able to flex, extend, supinate, and pronate arm. Two point discrimination intact. Normal thumb opposition. Intact ROM for all MCPs, PIPs, and DIPs.  No snuffbox ttp. No sensory deficits.  Capillary refill <2sec   Neurological:     Mental Status: She is alert.     Comments: Clear speech.   Psychiatric:        Behavior: Behavior normal.        Thought Content: Thought content normal.     ED Results / Procedures / Treatments   Labs (all labs ordered are listed, but only abnormal results are displayed) Labs Reviewed - No data to display  EKG None  Radiology DG Wrist 2 Views Right  Result Date: 05/10/2022 CLINICAL DATA:  Trauma. Tripped over sidewalk. Patient reports right forearm surgery in January. Unable to move arm. Pain. EXAM: RIGHT WRIST - 2 VIEW COMPARISON:  None Available. FINDINGS: Comminuted distal radius fracture with mild displacement. Fracture extends into the distal radioulnar and radiocarpal joint. Comminuted fracture of the distal ulna involves the ulna styloid is well as the dorsal aspect of the metaphysis. Intact carpal bones. There is generalized soft tissue edema about the wrist. IMPRESSION: 1. Comminuted distal radius fracture with mild displacement extending into the distal radioulnar and radiocarpal joints. 2. Comminuted fracture of the distal ulna involves the ulna styloid and dorsal aspect of the metaphysis. Electronically Signed   By: Barbara Rutherford M.D.   On: 05/10/2022 17:34   DG Elbow 2 Views Right  Result Date: 05/10/2022 CLINICAL DATA:  Trauma. Tripped over sidewalk. Patient reports right forearm surgery in January. Unable to move arm. Pain. EXAM: RIGHT ELBOW - 2 VIEW COMPARISON:  None Available. FINDINGS: Technically limited by positioning. There is no evidence of fracture, dislocation, or joint effusion. There is no evidence of arthropathy or other focal bone abnormality. Soft tissues are unremarkable. IMPRESSION: No acute fracture or dislocation. Electronically Signed   By: Barbara Rutherford M.D.   On: 05/10/2022 17:32   DG Hand 2 View Right  Result Date: 05/10/2022 CLINICAL DATA:  Trauma. Tripped over sidewalk. Patient reports right forearm  surgery in January. Unable to move arm. Pain. EXAM: RIGHT HAND - 2 VIEW COMPARISON:  None Available. FINDINGS: Distal radius and ulnar fractures are better assessed on concurrent wrist exam. No additional fracture of the hand. Evaluation of the digits is slightly limited due to difficulties with positioning. Mild chronic arthropathy of the thumb carpal metacarpal joint. The bones appear under mineralized IMPRESSION: 1. Distal radius and ulnar fractures are better assessed on concurrent wrist exam. 2. No additional fracture of the hand. Electronically Signed   By: Barbara Rutherford M.D.   On: 05/10/2022 17:31   DG Forearm Right  Result Date: 05/10/2022 CLINICAL  DATA:  Trauma. Tripped over sidewalk. Patient reports right forearm surgery in January. Unable to move arm. Pain. EXAM: RIGHT FOREARM - 2 VIEW COMPARISON:  None Available. FINDINGS: Distal radius and ulna fractures are better assessed on concurrent wrist exam. No fracture of the proximal radius or ulna. Elbow alignment is maintained. No elbow joint effusion. The bones appear under mineralized. IMPRESSION: Distal radius and ulna fractures are better assessed on concurrent wrist exam. No fracture of the proximal forearm. Electronically Signed   By: Barbara Rutherford M.D.   On: 05/10/2022 17:30    Procedures .Splint Application  Date/Time: 05/10/2022 7:03 PM  Performed by: Pete Pelt, [name] Authorized by: Pete Pelt, [name]   Consent:    Consent obtained:  Verbal   Consent given by:  Patient   Risks, benefits, and alternatives were discussed: yes     Risks discussed:  Discoloration, pain, swelling and numbness   Alternatives discussed:  No treatment Universal protocol:    Patient identity confirmed:  Verbally with patient Pre-procedure details:    Distal neurologic exam:  Normal   Distal perfusion: distal pulses strong   Procedure details:    Location:  Arm   Arm location:  R lower arm   Splint type:  Sugar tong   Supplies:  Cotton  padding, fiberglass, elastic bandage and sling   Attestation: Splint applied and adjusted personally by me   Post-procedure details:    Distal neurologic exam:  Normal   Distal perfusion: distal pulses strong     Procedure completion:  Tolerated   Post-procedure imaging: not applicable       Medications Ordered in ED Medications  fentaNYL (SUBLIMAZE) injection 50 mcg (50 mcg Intravenous Given 05/10/22 1705)  HYDROmorphone (DILAUDID) injection 1 mg (1 mg Intravenous Given 05/10/22 1754)    ED Course/ Medical Decision Making/ A&P                             Medical Decision Making Patient here for FOOSH fall w/right arm pain. No neurodeficits, +radial pulse. Will obtain imaging. No head trauma or neck pain.   Amount and/or Complexity of Data Reviewed Radiology: ordered.    Details: Distal comminuted radius and ulna fractures Discussion of management or test interpretation with external provider(s): Discussed with Dr. Romeo Apple, he is in agreement with plan. Initially attempted to speak with Dr. Amanda [name], Dr. Romeo Apple called by secretary instead. Pt spoke with Dr. Amanda [name] her ortho, he will see her on Monday. Sugar tong placed, with no difficulty. Good radial pulses, better pain control. Will have her follow-up with ortho. Sent home with Omair Dettmer prepack of percocet given pharmacy is closed. Return precautions emphasized. Placed in splint.   Risk Prescription drug management.    Final Clinical Impression(s) / ED Diagnoses Final diagnoses:  Other closed intra-articular fracture of distal end of right radius, initial encounter  Closed fracture of distal end of right ulna, unspecified fracture morphology, initial encounter    Rx / DC Orders ED Discharge Orders          Ordered    oxyCODONE-acetaminophen (PERCOCET/ROXICET) 5-325 MG tablet  Every 6 hours PRN        05/10/22 1852    oxyCODONE-acetaminophen (PERCOCET/ROXICET) 5-325 MG tablet  Every 6 hours PRN        05/10/22 1858               Pete Pelt, [name] 05/10/22 1906  Eber Hong, [name] 05/12/22 2035

## 2022-05-10 NOTE — ED Triage Notes (Signed)
Pt to ED from home with c/o fall after tripping over sidewalk, pt reports recent major surgery on right forearm in January, pt re-injured this arm when falling today. Pt appears in pain. No LOC. Dr Hyacinth Meeker aware.

## 2022-05-10 NOTE — Discharge Instructions (Addendum)
Please follow-up with your orthopedic surgeon. Do not get the splint wet. If you have numbness, loss of pulse, your hand turns blue, or severe pain return to the ED.

## 2022-05-12 DIAGNOSIS — S52501D Unspecified fracture of the lower end of right radius, subsequent encounter for closed fracture with routine healing: Secondary | ICD-10-CM | POA: Diagnosis not present

## 2022-05-12 DIAGNOSIS — S52501A Unspecified fracture of the lower end of right radius, initial encounter for closed fracture: Secondary | ICD-10-CM | POA: Diagnosis not present

## 2022-05-12 MED FILL — Oxycodone w/ Acetaminophen Tab 5-325 MG: ORAL | Qty: 6 | Status: AC

## 2022-05-13 DIAGNOSIS — X58XXXA Exposure to other specified factors, initial encounter: Secondary | ICD-10-CM | POA: Diagnosis not present

## 2022-05-13 DIAGNOSIS — S52571A Other intraarticular fracture of lower end of right radius, initial encounter for closed fracture: Secondary | ICD-10-CM | POA: Diagnosis not present

## 2022-05-26 DIAGNOSIS — S52501D Unspecified fracture of the lower end of right radius, subsequent encounter for closed fracture with routine healing: Secondary | ICD-10-CM | POA: Diagnosis not present

## 2022-05-26 DIAGNOSIS — Z4789 Encounter for other orthopedic aftercare: Secondary | ICD-10-CM | POA: Diagnosis not present

## 2022-05-26 DIAGNOSIS — M1811 Unilateral primary osteoarthritis of first carpometacarpal joint, right hand: Secondary | ICD-10-CM | POA: Diagnosis not present

## 2022-05-28 DIAGNOSIS — M25531 Pain in right wrist: Secondary | ICD-10-CM | POA: Diagnosis not present

## 2022-05-28 DIAGNOSIS — M25631 Stiffness of right wrist, not elsewhere classified: Secondary | ICD-10-CM | POA: Diagnosis not present

## 2022-05-28 DIAGNOSIS — M25441 Effusion, right hand: Secondary | ICD-10-CM | POA: Diagnosis not present

## 2022-05-28 DIAGNOSIS — R531 Weakness: Secondary | ICD-10-CM | POA: Diagnosis not present

## 2022-05-28 DIAGNOSIS — Z4789 Encounter for other orthopedic aftercare: Secondary | ICD-10-CM | POA: Diagnosis not present

## 2022-05-28 DIAGNOSIS — M25641 Stiffness of right hand, not elsewhere classified: Secondary | ICD-10-CM | POA: Diagnosis not present

## 2022-06-11 ENCOUNTER — Other Ambulatory Visit (HOSPITAL_COMMUNITY): Payer: Self-pay | Admitting: Orthopedic Surgery

## 2022-06-11 DIAGNOSIS — M13811 Other specified arthritis, right shoulder: Secondary | ICD-10-CM | POA: Diagnosis not present

## 2022-06-11 DIAGNOSIS — M25631 Stiffness of right wrist, not elsewhere classified: Secondary | ICD-10-CM | POA: Diagnosis not present

## 2022-06-11 DIAGNOSIS — Z4789 Encounter for other orthopedic aftercare: Secondary | ICD-10-CM | POA: Diagnosis not present

## 2022-06-11 DIAGNOSIS — M25511 Pain in right shoulder: Secondary | ICD-10-CM

## 2022-06-13 DIAGNOSIS — R531 Weakness: Secondary | ICD-10-CM | POA: Diagnosis not present

## 2022-06-13 DIAGNOSIS — M25631 Stiffness of right wrist, not elsewhere classified: Secondary | ICD-10-CM | POA: Diagnosis not present

## 2022-06-13 DIAGNOSIS — M25641 Stiffness of right hand, not elsewhere classified: Secondary | ICD-10-CM | POA: Diagnosis not present

## 2022-06-13 DIAGNOSIS — Z4789 Encounter for other orthopedic aftercare: Secondary | ICD-10-CM | POA: Diagnosis not present

## 2022-06-13 DIAGNOSIS — M25531 Pain in right wrist: Secondary | ICD-10-CM | POA: Diagnosis not present

## 2022-06-13 DIAGNOSIS — M25441 Effusion, right hand: Secondary | ICD-10-CM | POA: Diagnosis not present

## 2022-06-17 DIAGNOSIS — M25641 Stiffness of right hand, not elsewhere classified: Secondary | ICD-10-CM | POA: Diagnosis not present

## 2022-06-17 DIAGNOSIS — R531 Weakness: Secondary | ICD-10-CM | POA: Diagnosis not present

## 2022-06-17 DIAGNOSIS — M25531 Pain in right wrist: Secondary | ICD-10-CM | POA: Diagnosis not present

## 2022-06-17 DIAGNOSIS — M25631 Stiffness of right wrist, not elsewhere classified: Secondary | ICD-10-CM | POA: Diagnosis not present

## 2022-06-17 DIAGNOSIS — Z4789 Encounter for other orthopedic aftercare: Secondary | ICD-10-CM | POA: Diagnosis not present

## 2022-06-17 DIAGNOSIS — M25441 Effusion, right hand: Secondary | ICD-10-CM | POA: Diagnosis not present

## 2022-06-24 DIAGNOSIS — M25441 Effusion, right hand: Secondary | ICD-10-CM | POA: Diagnosis not present

## 2022-06-24 DIAGNOSIS — M25641 Stiffness of right hand, not elsewhere classified: Secondary | ICD-10-CM | POA: Diagnosis not present

## 2022-06-24 DIAGNOSIS — M25531 Pain in right wrist: Secondary | ICD-10-CM | POA: Diagnosis not present

## 2022-06-24 DIAGNOSIS — Z4789 Encounter for other orthopedic aftercare: Secondary | ICD-10-CM | POA: Diagnosis not present

## 2022-06-24 DIAGNOSIS — R531 Weakness: Secondary | ICD-10-CM | POA: Diagnosis not present

## 2022-06-24 DIAGNOSIS — M25631 Stiffness of right wrist, not elsewhere classified: Secondary | ICD-10-CM | POA: Diagnosis not present

## 2022-06-27 DIAGNOSIS — M25511 Pain in right shoulder: Secondary | ICD-10-CM | POA: Diagnosis not present

## 2022-06-30 DIAGNOSIS — M13811 Other specified arthritis, right shoulder: Secondary | ICD-10-CM | POA: Diagnosis not present

## 2022-06-30 DIAGNOSIS — M25511 Pain in right shoulder: Secondary | ICD-10-CM | POA: Diagnosis not present

## 2022-06-30 DIAGNOSIS — M25531 Pain in right wrist: Secondary | ICD-10-CM | POA: Diagnosis not present

## 2022-06-30 DIAGNOSIS — S52501D Unspecified fracture of the lower end of right radius, subsequent encounter for closed fracture with routine healing: Secondary | ICD-10-CM | POA: Diagnosis not present

## 2022-07-01 ENCOUNTER — Other Ambulatory Visit: Payer: Self-pay | Admitting: Internal Medicine

## 2022-07-01 DIAGNOSIS — Z4789 Encounter for other orthopedic aftercare: Secondary | ICD-10-CM | POA: Diagnosis not present

## 2022-07-01 DIAGNOSIS — M25641 Stiffness of right hand, not elsewhere classified: Secondary | ICD-10-CM | POA: Diagnosis not present

## 2022-07-01 DIAGNOSIS — R531 Weakness: Secondary | ICD-10-CM | POA: Diagnosis not present

## 2022-07-01 DIAGNOSIS — M25631 Stiffness of right wrist, not elsewhere classified: Secondary | ICD-10-CM | POA: Diagnosis not present

## 2022-07-01 DIAGNOSIS — Z0000 Encounter for general adult medical examination without abnormal findings: Secondary | ICD-10-CM

## 2022-07-01 DIAGNOSIS — M25441 Effusion, right hand: Secondary | ICD-10-CM | POA: Diagnosis not present

## 2022-07-01 DIAGNOSIS — M25531 Pain in right wrist: Secondary | ICD-10-CM | POA: Diagnosis not present

## 2022-07-02 DIAGNOSIS — Z4789 Encounter for other orthopedic aftercare: Secondary | ICD-10-CM | POA: Diagnosis not present

## 2022-07-02 DIAGNOSIS — M25631 Stiffness of right wrist, not elsewhere classified: Secondary | ICD-10-CM | POA: Diagnosis not present

## 2022-07-02 DIAGNOSIS — M25441 Effusion, right hand: Secondary | ICD-10-CM | POA: Diagnosis not present

## 2022-07-02 DIAGNOSIS — M25641 Stiffness of right hand, not elsewhere classified: Secondary | ICD-10-CM | POA: Diagnosis not present

## 2022-07-02 DIAGNOSIS — M25531 Pain in right wrist: Secondary | ICD-10-CM | POA: Diagnosis not present

## 2022-07-02 DIAGNOSIS — R531 Weakness: Secondary | ICD-10-CM | POA: Diagnosis not present

## 2022-07-07 DIAGNOSIS — M25641 Stiffness of right hand, not elsewhere classified: Secondary | ICD-10-CM | POA: Diagnosis not present

## 2022-07-07 DIAGNOSIS — R531 Weakness: Secondary | ICD-10-CM | POA: Diagnosis not present

## 2022-07-07 DIAGNOSIS — M25631 Stiffness of right wrist, not elsewhere classified: Secondary | ICD-10-CM | POA: Diagnosis not present

## 2022-07-07 DIAGNOSIS — Z4789 Encounter for other orthopedic aftercare: Secondary | ICD-10-CM | POA: Diagnosis not present

## 2022-07-07 DIAGNOSIS — M25531 Pain in right wrist: Secondary | ICD-10-CM | POA: Diagnosis not present

## 2022-07-07 DIAGNOSIS — M25441 Effusion, right hand: Secondary | ICD-10-CM | POA: Diagnosis not present

## 2022-07-11 DIAGNOSIS — M25441 Effusion, right hand: Secondary | ICD-10-CM | POA: Diagnosis not present

## 2022-07-11 DIAGNOSIS — Z4789 Encounter for other orthopedic aftercare: Secondary | ICD-10-CM | POA: Diagnosis not present

## 2022-07-11 DIAGNOSIS — R531 Weakness: Secondary | ICD-10-CM | POA: Diagnosis not present

## 2022-07-11 DIAGNOSIS — M25641 Stiffness of right hand, not elsewhere classified: Secondary | ICD-10-CM | POA: Diagnosis not present

## 2022-07-11 DIAGNOSIS — M25631 Stiffness of right wrist, not elsewhere classified: Secondary | ICD-10-CM | POA: Diagnosis not present

## 2022-07-11 DIAGNOSIS — M25531 Pain in right wrist: Secondary | ICD-10-CM | POA: Diagnosis not present

## 2022-07-16 DIAGNOSIS — R531 Weakness: Secondary | ICD-10-CM | POA: Diagnosis not present

## 2022-07-16 DIAGNOSIS — R0609 Other forms of dyspnea: Secondary | ICD-10-CM | POA: Insufficient documentation

## 2022-07-16 DIAGNOSIS — J4530 Mild persistent asthma, uncomplicated: Secondary | ICD-10-CM | POA: Diagnosis not present

## 2022-07-16 DIAGNOSIS — M25441 Effusion, right hand: Secondary | ICD-10-CM | POA: Diagnosis not present

## 2022-07-16 DIAGNOSIS — R079 Chest pain, unspecified: Secondary | ICD-10-CM | POA: Diagnosis not present

## 2022-07-16 DIAGNOSIS — Z66 Do not resuscitate: Secondary | ICD-10-CM | POA: Diagnosis not present

## 2022-07-16 DIAGNOSIS — R030 Elevated blood-pressure reading, without diagnosis of hypertension: Secondary | ICD-10-CM | POA: Diagnosis not present

## 2022-07-16 DIAGNOSIS — M25631 Stiffness of right wrist, not elsewhere classified: Secondary | ICD-10-CM | POA: Diagnosis not present

## 2022-07-16 DIAGNOSIS — J90 Pleural effusion, not elsewhere classified: Secondary | ICD-10-CM | POA: Diagnosis not present

## 2022-07-16 DIAGNOSIS — M25641 Stiffness of right hand, not elsewhere classified: Secondary | ICD-10-CM | POA: Diagnosis not present

## 2022-07-16 DIAGNOSIS — M25531 Pain in right wrist: Secondary | ICD-10-CM | POA: Diagnosis not present

## 2022-07-16 DIAGNOSIS — M542 Cervicalgia: Secondary | ICD-10-CM | POA: Diagnosis not present

## 2022-07-16 DIAGNOSIS — R0602 Shortness of breath: Secondary | ICD-10-CM | POA: Diagnosis not present

## 2022-07-16 DIAGNOSIS — Z4789 Encounter for other orthopedic aftercare: Secondary | ICD-10-CM | POA: Diagnosis not present

## 2022-07-16 DIAGNOSIS — J9811 Atelectasis: Secondary | ICD-10-CM | POA: Diagnosis not present

## 2022-07-16 DIAGNOSIS — E785 Hyperlipidemia, unspecified: Secondary | ICD-10-CM | POA: Diagnosis not present

## 2022-07-17 DIAGNOSIS — I3489 Other nonrheumatic mitral valve disorders: Secondary | ICD-10-CM | POA: Diagnosis not present

## 2022-07-17 DIAGNOSIS — R0609 Other forms of dyspnea: Secondary | ICD-10-CM | POA: Diagnosis not present

## 2022-07-17 DIAGNOSIS — R030 Elevated blood-pressure reading, without diagnosis of hypertension: Secondary | ICD-10-CM | POA: Diagnosis not present

## 2022-07-17 DIAGNOSIS — E785 Hyperlipidemia, unspecified: Secondary | ICD-10-CM | POA: Diagnosis not present

## 2022-07-17 DIAGNOSIS — R079 Chest pain, unspecified: Secondary | ICD-10-CM | POA: Diagnosis not present

## 2022-07-18 DIAGNOSIS — M25641 Stiffness of right hand, not elsewhere classified: Secondary | ICD-10-CM | POA: Diagnosis not present

## 2022-07-18 DIAGNOSIS — M25631 Stiffness of right wrist, not elsewhere classified: Secondary | ICD-10-CM | POA: Diagnosis not present

## 2022-07-18 DIAGNOSIS — M25531 Pain in right wrist: Secondary | ICD-10-CM | POA: Diagnosis not present

## 2022-07-18 DIAGNOSIS — M25441 Effusion, right hand: Secondary | ICD-10-CM | POA: Diagnosis not present

## 2022-07-18 DIAGNOSIS — R531 Weakness: Secondary | ICD-10-CM | POA: Diagnosis not present

## 2022-07-18 DIAGNOSIS — Z4789 Encounter for other orthopedic aftercare: Secondary | ICD-10-CM | POA: Diagnosis not present

## 2022-07-21 DIAGNOSIS — M25441 Effusion, right hand: Secondary | ICD-10-CM | POA: Diagnosis not present

## 2022-07-21 DIAGNOSIS — M25631 Stiffness of right wrist, not elsewhere classified: Secondary | ICD-10-CM | POA: Diagnosis not present

## 2022-07-21 DIAGNOSIS — Z4789 Encounter for other orthopedic aftercare: Secondary | ICD-10-CM | POA: Diagnosis not present

## 2022-07-21 DIAGNOSIS — M25641 Stiffness of right hand, not elsewhere classified: Secondary | ICD-10-CM | POA: Diagnosis not present

## 2022-07-21 DIAGNOSIS — R531 Weakness: Secondary | ICD-10-CM | POA: Diagnosis not present

## 2022-07-21 DIAGNOSIS — M25531 Pain in right wrist: Secondary | ICD-10-CM | POA: Diagnosis not present

## 2022-07-25 DIAGNOSIS — M25531 Pain in right wrist: Secondary | ICD-10-CM | POA: Diagnosis not present

## 2022-07-25 DIAGNOSIS — R030 Elevated blood-pressure reading, without diagnosis of hypertension: Secondary | ICD-10-CM | POA: Diagnosis not present

## 2022-07-25 DIAGNOSIS — E782 Mixed hyperlipidemia: Secondary | ICD-10-CM | POA: Diagnosis not present

## 2022-07-25 DIAGNOSIS — Z4789 Encounter for other orthopedic aftercare: Secondary | ICD-10-CM | POA: Diagnosis not present

## 2022-07-25 DIAGNOSIS — R079 Chest pain, unspecified: Secondary | ICD-10-CM | POA: Diagnosis not present

## 2022-07-25 DIAGNOSIS — R0609 Other forms of dyspnea: Secondary | ICD-10-CM | POA: Diagnosis not present

## 2022-07-25 DIAGNOSIS — M25441 Effusion, right hand: Secondary | ICD-10-CM | POA: Diagnosis not present

## 2022-07-25 DIAGNOSIS — R531 Weakness: Secondary | ICD-10-CM | POA: Diagnosis not present

## 2022-07-25 DIAGNOSIS — M25641 Stiffness of right hand, not elsewhere classified: Secondary | ICD-10-CM | POA: Diagnosis not present

## 2022-07-25 DIAGNOSIS — M25631 Stiffness of right wrist, not elsewhere classified: Secondary | ICD-10-CM | POA: Diagnosis not present

## 2022-07-28 DIAGNOSIS — M25641 Stiffness of right hand, not elsewhere classified: Secondary | ICD-10-CM | POA: Diagnosis not present

## 2022-07-28 DIAGNOSIS — M25531 Pain in right wrist: Secondary | ICD-10-CM | POA: Diagnosis not present

## 2022-07-28 DIAGNOSIS — Z4789 Encounter for other orthopedic aftercare: Secondary | ICD-10-CM | POA: Diagnosis not present

## 2022-07-28 DIAGNOSIS — M25441 Effusion, right hand: Secondary | ICD-10-CM | POA: Diagnosis not present

## 2022-07-28 DIAGNOSIS — M25631 Stiffness of right wrist, not elsewhere classified: Secondary | ICD-10-CM | POA: Diagnosis not present

## 2022-07-28 DIAGNOSIS — R531 Weakness: Secondary | ICD-10-CM | POA: Diagnosis not present

## 2022-07-29 DIAGNOSIS — H01002 Unspecified blepharitis right lower eyelid: Secondary | ICD-10-CM | POA: Diagnosis not present

## 2022-07-29 DIAGNOSIS — H01004 Unspecified blepharitis left upper eyelid: Secondary | ICD-10-CM | POA: Diagnosis not present

## 2022-07-29 DIAGNOSIS — H43811 Vitreous degeneration, right eye: Secondary | ICD-10-CM | POA: Diagnosis not present

## 2022-07-29 DIAGNOSIS — H01001 Unspecified blepharitis right upper eyelid: Secondary | ICD-10-CM | POA: Diagnosis not present

## 2022-08-05 DIAGNOSIS — R0602 Shortness of breath: Secondary | ICD-10-CM | POA: Diagnosis not present

## 2022-08-05 DIAGNOSIS — F331 Major depressive disorder, recurrent, moderate: Secondary | ICD-10-CM | POA: Diagnosis not present

## 2022-08-05 DIAGNOSIS — Z6835 Body mass index (BMI) 35.0-35.9, adult: Secondary | ICD-10-CM | POA: Diagnosis not present

## 2022-08-05 DIAGNOSIS — R030 Elevated blood-pressure reading, without diagnosis of hypertension: Secondary | ICD-10-CM | POA: Diagnosis not present

## 2022-08-11 DIAGNOSIS — Z4789 Encounter for other orthopedic aftercare: Secondary | ICD-10-CM | POA: Diagnosis not present

## 2022-08-11 DIAGNOSIS — N958 Other specified menopausal and perimenopausal disorders: Secondary | ICD-10-CM | POA: Diagnosis not present

## 2022-08-11 DIAGNOSIS — M65849 Other synovitis and tenosynovitis, unspecified hand: Secondary | ICD-10-CM | POA: Diagnosis not present

## 2022-08-11 DIAGNOSIS — M8588 Other specified disorders of bone density and structure, other site: Secondary | ICD-10-CM | POA: Diagnosis not present

## 2022-08-11 DIAGNOSIS — Z1231 Encounter for screening mammogram for malignant neoplasm of breast: Secondary | ICD-10-CM | POA: Diagnosis not present

## 2022-08-11 LAB — HM MAMMOGRAPHY

## 2022-08-12 DIAGNOSIS — R0789 Other chest pain: Secondary | ICD-10-CM | POA: Diagnosis not present

## 2022-08-12 DIAGNOSIS — R079 Chest pain, unspecified: Secondary | ICD-10-CM | POA: Diagnosis not present

## 2022-08-12 DIAGNOSIS — M659 Synovitis and tenosynovitis, unspecified: Secondary | ICD-10-CM | POA: Insufficient documentation

## 2022-08-12 DIAGNOSIS — R0609 Other forms of dyspnea: Secondary | ICD-10-CM | POA: Diagnosis not present

## 2022-08-16 DIAGNOSIS — E7800 Pure hypercholesterolemia, unspecified: Secondary | ICD-10-CM | POA: Diagnosis not present

## 2022-08-16 DIAGNOSIS — Z6835 Body mass index (BMI) 35.0-35.9, adult: Secondary | ICD-10-CM | POA: Diagnosis not present

## 2022-08-16 DIAGNOSIS — R0600 Dyspnea, unspecified: Secondary | ICD-10-CM | POA: Diagnosis not present

## 2022-08-16 DIAGNOSIS — I10 Essential (primary) hypertension: Secondary | ICD-10-CM | POA: Diagnosis not present

## 2022-08-20 DIAGNOSIS — Z6834 Body mass index (BMI) 34.0-34.9, adult: Secondary | ICD-10-CM | POA: Diagnosis not present

## 2022-08-20 DIAGNOSIS — F32A Depression, unspecified: Secondary | ICD-10-CM | POA: Diagnosis not present

## 2022-08-20 DIAGNOSIS — F331 Major depressive disorder, recurrent, moderate: Secondary | ICD-10-CM | POA: Diagnosis not present

## 2022-08-20 DIAGNOSIS — R0600 Dyspnea, unspecified: Secondary | ICD-10-CM | POA: Diagnosis not present

## 2022-08-20 DIAGNOSIS — R0602 Shortness of breath: Secondary | ICD-10-CM | POA: Diagnosis not present

## 2022-08-20 DIAGNOSIS — I10 Essential (primary) hypertension: Secondary | ICD-10-CM | POA: Diagnosis not present

## 2022-08-20 DIAGNOSIS — E7801 Familial hypercholesterolemia: Secondary | ICD-10-CM | POA: Diagnosis not present

## 2022-08-22 DIAGNOSIS — I10 Essential (primary) hypertension: Secondary | ICD-10-CM | POA: Insufficient documentation

## 2022-08-22 DIAGNOSIS — R0609 Other forms of dyspnea: Secondary | ICD-10-CM | POA: Diagnosis not present

## 2022-08-22 DIAGNOSIS — R079 Chest pain, unspecified: Secondary | ICD-10-CM | POA: Diagnosis not present

## 2022-08-22 DIAGNOSIS — E785 Hyperlipidemia, unspecified: Secondary | ICD-10-CM | POA: Diagnosis not present

## 2022-08-28 NOTE — Progress Notes (Unsigned)
Barbara [name], female    DOB: 10/01/1945    MRN: [id number]   Brief patient profile:  65 yowf  never smoked but house of smokers growing up >>>  pattern of recurrent / almost daily cough as long as she can remember on daily inhaler x ? Since early 2000  much worse cough x May 2024 assoc sob  referred to pulmonary clinic in [facility]  08/29/2022 by Dr Mayford Knife for eval of worsening cough and sob with classic pseudowheeze at initial eval.    History of Present Illness  08/29/2022  Pulmonary/ 1st office eval/  / [place] [facility] maint on advair  Chief Complaint  Patient presents with   Establish Care   Shortness of Breath   Dyspnea:  still able shop / cross parking lot/ slower than others/ leaning on cart at grocery sotre Cough: no production / worse immediately on lying down  Sleep: bed is flat/ wedge pillow  helps, otherwise cough much worse hs  SABA use: albuterol doesn't help/ last used one day prior, same with advair  02: none   No obvious day to day or daytime pattern/variability or assoc excess/ purulent sputum or mucus plugs or hemoptysis or cp or chest tightness, subjective wheeze or overt sinus or hb symptoms.    Also denies any obvious fluctuation of symptoms with weather or environmental changes or other aggravating or alleviating factors except as outlined above   No unusual exposure hx or h/o childhood pna/ asthma or knowledge of premature birth.  Current Allergies, Complete Past Medical History, Past Surgical History, Family History, and Social History were reviewed in [employer] record.  ROS  The following are not active complaints unless bolded Hoarseness, sore throat, dysphagia, dental problems, itching, sneezing,  nasal congestion or discharge of excess mucus or purulent secretions, ear ache,   fever, chills, sweats, unintended wt loss or wt gain, classically pleuritic or exertional cp,  orthopnea pnd or arm/hand swelling  or leg swelling,  presyncope, palpitations, abdominal pain, anorexia, nausea, vomiting, diarrhea  or change in bowel habits or change in bladder habits, change in stools or change in urine, dysuria, hematuria,  rash, arthralgias, visual complaints, headache, numbness, weakness or ataxia or problems with walking or coordination,  change in mood or  memory.              Outpatient Medications Prior to Visit  Medication Sig Dispense Refill   ADVAIR DISKUS 250-50 MCG/DOSE AEPB Inhale 1 puff into the lungs 2 (two) times daily.  12   albuterol (PROVENTIL HFA;VENTOLIN HFA) 108 (90 BASE) MCG/ACT inhaler Inhale 2 puffs into the lungs every 6 (six) hours as needed for wheezing or shortness of breath.      atorvastatin (LIPITOR) 40 MG tablet Take 40 mg by mouth every other day.      celecoxib (CELEBREX) 200 MG capsule Take 200 mg by mouth every other day.      cyclobenzaprine (FLEXERIL) 10 MG tablet Take 10 mg by mouth 3 (three) times daily as needed. Sciatic nerve     furosemide (LASIX) 20 MG tablet Take 1 tablet by mouth daily.     losartan (COZAAR) 25 MG tablet Take 1 tablet by mouth daily.     MYRBETRIQ 50 MG TB24 tablet Take 50 mg by mouth daily.     omeprazole (PRILOSEC) 20 MG capsule Take 20 mg by mouth daily.       oxyCODONE-acetaminophen (PERCOCET/ROXICET) 5-325 MG tablet Take 1 tablet by mouth every  6 (six) hours as needed for severe pain. 10 tablet 0   polyethylene glycol (MIRALAX / GLYCOLAX) packet Take 17 g by mouth daily as needed for moderate constipation.     Probiotic Product (DIGESTIVE ADVANTAGE GUMMIES PO) Take 2 each by mouth daily.     senna (SENOKOT) 8.6 MG tablet Take 2 tablets by mouth at bedtime.     Venlafaxine HCl 150 MG TB24 Take 1 tablet by mouth daily.     carvedilol (COREG) 6.25 MG tablet Take 1 tablet by mouth 2 (two) times daily. (Patient not taking: Reported on 08/29/2022)     doxepin (SINEQUAN) 10 MG capsule Take 1 capsule (10 mg total) by mouth 3 (three) times daily. (Patient not taking:  Reported on 08/29/2022) 21 capsule 0   ondansetron (ZOFRAN) 8 MG tablet Take 1 tablet (8 mg total) by mouth every 4 (four) hours as needed. (Patient not taking: Reported on 08/29/2022) 10 tablet 0   oxyCODONE-acetaminophen (PERCOCET/ROXICET) 5-325 MG tablet Take 1 tablet by mouth every 6 (six) hours as needed for severe pain. 4 tablet 0   predniSONE (DELTASONE) 50 MG tablet Take 1 tablet (50 mg total) by mouth daily. 5 tablet 0   tamsulosin (FLOMAX) 0.4 MG CAPS capsule Take 1 capsule (0.4 mg total) by mouth daily. (Patient not taking: Reported on 08/29/2022) 10 capsule 0   tolterodine (DETROL LA) 4 MG 24 hr capsule Take 4 mg by mouth daily.       venlafaxine (EFFEXOR-XR) 150 MG 24 hr capsule Take 150 mg by mouth daily.       Venlafaxine HCl 225 MG TB24 Take 1 tablet by mouth daily.     No facility-administered medications prior to visit.    Past Medical History:  Diagnosis Date   Anxiety    Asthma    Bladder spasms    DDD (degenerative disc disease)    GERD (gastroesophageal reflux disease)    Hypercholesteremia    Sciatic nerve disease    Sleep apnea       Objective:     BP 112/78   Pulse (!) 102   Ht 5' (1.524 m)   Wt 197 lb (89.4 kg)   SpO2 92%   BMI 38.47 kg/m   SpO2: 92 %  Amb wf with classic  voice fatigue and  pseudowheeze   Some squeaks /pops   HEENT : Oropharynx  clear     Nasal turbinates nl    NECK :  without  apparent JVD/ palpable Nodes/TM    LUNGS: no acc muscle use,  Nl contour chest with mostly transmitted wheeze from upper airway, a few insp squeaks / pops in  bases cough on insp/ not exp maneuvers   CV:  RRR  no s3 or murmur or increase in P2, and no edema   ABD:  soft and nontender with nl inspiratory excursion in the supine position. No bruits or organomegaly appreciated   MS:  Nl gait/ ext warm without deformities Or obvious joint restrictions  calf tenderness, cyanosis or clubbing    SKIN: warm and dry without lesions    NEURO:  alert,  approp, nl sensorium with  no motor or cerebellar deficits apparent.       I personally reviewed images and agree with radiology impression as follows:   Chest CTa 07/16/22     1. No evidence of central pulmonary embolus, evaluation of the  segmental and subsegmental pulmonary arteries is limited due to  bolus timing and motion artifact.  2. Bilateral mosaic attenuation, which can be seen in the setting of  small airways disease or pulmonary hypertension.  3. Elevation of the right hemidiaphragm and right-greater-than-left  atelectasis.  4. Aortic Atherosclerosis (ICD10-I70.0).    Assessment   Upper airway cough syndrome Onset as child and never resolved  - 08/29/2022 try off advair and on max gerd rx  Upper airway cough syndrome (previously labeled PNDS),  is so named because it's frequently impossible to sort out how much is  CR/sinusitis with freq throat clearing (which can be related to primary GERD)   vs  causing  secondary (" extra esophageal")  GERD from wide swings in gastric pressure that occur with throat clearing, often  promoting self use of mint and menthol lozenges that reduce the lower esophageal sphincter tone and exacerbate the problem further in a cyclical fashion.   These are the same pts (now being labeled as having "irritable larynx syndrome" by some cough centers) who not infrequently have a history of having failed to tolerate ace inhibitors,  dry powder inhalers(especially advair which may be the case here)  or biphosphonates or report having atypical/extraesophageal reflux symptoms that don't respond to standard doses of PPI  and are easily confused as having aecopd or asthma flares by even experienced allergists/ pulmonologists (myself included).   Of the three most common causes of  Sub-acute / recurrent or chronic cough, only one (GERD)  can actually contribute to/ trigger  the other two (asthma and post nasal drip syndrome)  and perpetuate the cylce of  cough.  While not intuitively obvious, many patients with chronic low grade reflux do not cough until there is a primary insult that disturbs the protective epithelial barrier and exposes sensitive nerve endings.   This is typically viral but can due to PNDS and  either may apply here.     >>>  The point is that once this occurs, it is difficult to eliminate the cycle  using anything but a maximally effective acid suppression regimen at least in the short run, accompanied by an appropriate diet to address non acid GERD and control / eliminate the cough itself for at least 3 days with hydrocone if needed to supplement delsym >>> also Depomedrol 120 mg IM today  in case of component of Th-2 driven upper or lower airways inflammation (if cough responds short term only to relapse before return while will on full rx for uacs (as above), then  that would point to allergic rhinitis/ asthma or eos bronchitis as alternative dx).  Albuterol use prn for breathing problems that don't resolve with better cough control.  F/u in 4 weeks with all meds in hand using a trust but verify approach to confirm accurate Medication  Reconciliation The principal here is that until we are certain that the  patients are doing what we've asked, it makes no sense to ask them to do more.      Sandrea Hughs, MD 08/29/2022

## 2022-08-29 ENCOUNTER — Encounter: Payer: Self-pay | Admitting: Internal Medicine

## 2022-08-29 ENCOUNTER — Ambulatory Visit: Payer: Medicare PPO | Admitting: Internal Medicine

## 2022-08-29 VITALS — BP 112/78 | HR 102 | Ht 60.0 in | Wt 197.0 lb

## 2022-08-29 DIAGNOSIS — M5416 Radiculopathy, lumbar region: Secondary | ICD-10-CM | POA: Insufficient documentation

## 2022-08-29 DIAGNOSIS — R058 Other specified cough: Secondary | ICD-10-CM

## 2022-08-29 MED ORDER — METHYLPREDNISOLONE ACETATE 80 MG/ML IJ SUSP
120.0000 mg | Freq: Once | INTRAMUSCULAR | Status: AC
Start: 2022-08-29 — End: 2022-08-29
  Administered 2022-08-29: 120 mg via INTRAMUSCULAR

## 2022-08-29 MED ORDER — FAMOTIDINE 20 MG PO TABS
ORAL_TABLET | ORAL | 11 refills | Status: DC
Start: 2022-08-29 — End: 2023-05-25

## 2022-08-29 MED ORDER — OXYCODONE-ACETAMINOPHEN 5-325 MG PO TABS: 1.0000 | ORAL_TABLET | ORAL | 0 refills | Status: AC | PRN

## 2022-08-29 MED ORDER — PANTOPRAZOLE SODIUM 40 MG PO TBEC: 40.0000 mg | DELAYED_RELEASE_TABLET | Freq: Every day | ORAL | 2 refills | Status: DC

## 2022-08-29 NOTE — Patient Instructions (Addendum)
Stop advair and omeprazole   Depomedrol 120 mg IM today   Pantoprazole (protonix) 40 mg   Take  30-60 min before first meal of the day and Pepcid (famotidine)  20 mg after supper until return to office - this is the best way to tell whether stomach acid is contributing to your problem.   GERD (REFLUX)  is an extremely common cause of respiratory symptoms just like yours , many times with no obvious heartburn at all.    It can be treated with medication, but also with lifestyle changes including elevation of the head of your bed (ideally with 6 -8inch blocks under the headboard of your bed),  Smoking cessation, avoidance of late meals, excessive alcohol, and avoid fatty foods, chocolate, peppermint, colas, red wine, and acidic juices such as orange juice.  NO MINT OR MENTHOL PRODUCTS SO NO COUGH DROPS  USE SUGARLESS CANDY INSTEAD (Jolley ranchers or Stover's or Life Savers) or even ice chips will also do - the key is to swallow to prevent all throat clearing. NO OIL BASED VITAMINS - use powdered substitutes.  Avoid fish oil when coughing.    Take Delsym two tsp every 12 hours and supplement if needed with  oxycodone every 4 hours to suppress the urge to cough. Swallowing water and/or using ice chips/non mint and menthol containing hard rock candies (as above) . You should rest your voice and avoid activities that you know make you cough.  Once you have eliminated the cough for 3 straight days try reducing the oxycodone  first,  then the delsym as tolerated.      For breahting > albuterol (Ventolin) 2 puffs up to every 4 hours as needed.  Please schedule a follow up office visit in 4 weeks, sooner if needed  with all medications /inhalers/ solutions in hand so we can verify exactly what you are taking. This includes all medications from all doctors and over the counters

## 2022-08-30 NOTE — Assessment & Plan Note (Signed)
Onset as child and never resolved  - 08/29/2022 try off advair and on max gerd rx  Upper airway cough syndrome (previously labeled PNDS),  is so named because it's frequently impossible to sort out how much is  CR/sinusitis with freq throat clearing (which can be related to primary GERD)   vs  causing  secondary (" extra esophageal")  GERD from wide swings in gastric pressure that occur with throat clearing, often  promoting self use of mint and menthol lozenges that reduce the lower esophageal sphincter tone and exacerbate the problem further in a cyclical fashion.   These are the same pts (now being labeled as having "irritable larynx syndrome" by some cough centers) who not infrequently have a history of having failed to tolerate ace inhibitors,  dry powder inhalers(especially advair which may be the case here)  or biphosphonates or report having atypical/extraesophageal reflux symptoms that don't respond to standard doses of PPI  and are easily confused as having aecopd or asthma flares by even experienced allergists/ pulmonologists (myself included).   Of the three most common causes of  Sub-acute / recurrent or chronic cough, only one (GERD)  can actually contribute to/ trigger  the other two (asthma and post nasal drip syndrome)  and perpetuate the cylce of cough.  While not intuitively obvious, many patients with chronic low grade reflux do not cough until there is a primary insult that disturbs the protective epithelial barrier and exposes sensitive nerve endings.   This is typically viral but can due to PNDS and  either may apply here.     >>>  The point is that once this occurs, it is difficult to eliminate the cycle  using anything but a maximally effective acid suppression regimen at least in the short run, accompanied by an appropriate diet to address non acid GERD and control / eliminate the cough itself for at least 3 days with hydrocone if needed to supplement delsym >>> also Depomedrol  120 mg IM today  in case of component of Th-2 driven upper or lower airways inflammation (if cough responds short term only to relapse before return while will on full rx for uacs (as above), then  that would point to allergic rhinitis/ asthma or eos bronchitis as alternative dx).  Albuterol use prn for breathing problems that don't resolve with better cough control.  F/u in 4 weeks with all meds in hand using a trust but verify approach to confirm accurate Medication  Reconciliation The principal here is that until we are certain that the  patients are doing what we've asked, it makes no sense to ask them to do more.

## 2022-09-02 ENCOUNTER — Encounter (HOSPITAL_BASED_OUTPATIENT_CLINIC_OR_DEPARTMENT_OTHER): Payer: Self-pay

## 2022-09-02 DIAGNOSIS — R5383 Other fatigue: Secondary | ICD-10-CM

## 2022-09-02 DIAGNOSIS — R0683 Snoring: Secondary | ICD-10-CM

## 2022-09-02 DIAGNOSIS — G4710 Hypersomnia, unspecified: Secondary | ICD-10-CM

## 2022-09-03 DIAGNOSIS — R7989 Other specified abnormal findings of blood chemistry: Secondary | ICD-10-CM | POA: Diagnosis not present

## 2022-09-24 DIAGNOSIS — Z6833 Body mass index (BMI) 33.0-33.9, adult: Secondary | ICD-10-CM | POA: Diagnosis not present

## 2022-09-24 DIAGNOSIS — R0602 Shortness of breath: Secondary | ICD-10-CM | POA: Diagnosis not present

## 2022-09-24 DIAGNOSIS — R0600 Dyspnea, unspecified: Secondary | ICD-10-CM | POA: Diagnosis not present

## 2022-09-24 DIAGNOSIS — E7800 Pure hypercholesterolemia, unspecified: Secondary | ICD-10-CM | POA: Diagnosis not present

## 2022-09-24 DIAGNOSIS — Z23 Encounter for immunization: Secondary | ICD-10-CM | POA: Diagnosis not present

## 2022-09-24 DIAGNOSIS — F331 Major depressive disorder, recurrent, moderate: Secondary | ICD-10-CM | POA: Diagnosis not present

## 2022-09-24 DIAGNOSIS — F32A Depression, unspecified: Secondary | ICD-10-CM | POA: Diagnosis not present

## 2022-09-24 DIAGNOSIS — I10 Essential (primary) hypertension: Secondary | ICD-10-CM | POA: Diagnosis not present

## 2022-09-24 NOTE — Progress Notes (Unsigned)
Barbara [name], female    DOB: 12/17/1945    MRN: [id number]   Brief patient profile:  49  yowf  never smoked but lived in  house of smokers growing up >>>  pattern of recurrent / almost daily cough as long as she can remember on daily inhaler x ? Since early 2000  much worse cough x May 2024 assoc sob  referred to pulmonary clinic in [facility]  08/29/2022 by Dr Mayford Knife for eval of worsening cough and sob with classic pseudowheeze at initial eval.    History of Present Illness  08/29/2022  Pulmonary/ 1st office eval/ Teresia Myint / [place] [facility] maint on advair  Chief Complaint  Patient presents with   Establish Care   Shortness of Breath  Dyspnea:  still able shop / cross parking lot/ slower than others/ leaning on cart at grocery store doe Cough: no production / worse immediately on lying down  Sleep: bed is flat/ wedge pillow  helps, otherwise cough much worse hs  SABA use: albuterol doesn't help/ last used one day prior, same with advair  02: none  Rec Stop advair and omeprazole  Depomedrol 120 mg IM   Pantoprazole (protonix) 40 mg   Take  30-60 min before first meal of the day and Pepcid (famotidine)  20 mg after supper until return to office - this is the best way to tell whether stomach acid is contributing to your problem.  GERD diet reviewed, bed blocks rec  Take Delsym two tsp every 12 hours and supplement if needed with  oxycodone every 4 hours to suppress the urge to cough.   Once you have eliminated the cough for 3 straight days try reducing the oxycodone  first,  then the delsym as tolerated.    For breahting > albuterol (Ventolin) 2 puffs up to every 4 hours as needed. Please schedule a follow up office visit in 4 weeks, sooner if needed  with all medications /inhalers/ solutions in hand      09/26/2022  f/u ov/[place] office/Dannell Gortney re: cough since ? Childhood?  maint on protonix  / Flovent 110  Chief Complaint  Patient presents with   Upper airway cough syndrome   Dyspnea:  [employer] at slow speed/ slow across the parking lot  Cough: immediately on lying down does not keep her up / not as bad as it was / no pattern to daytime cough / non productive  Sleeping: flat bed/ one pillow resp cc  SABA use: none  02: none      No obvious day to day or daytime variability or assoc excess/ purulent sputum or mucus plugs or hemoptysis or cp or chest tightness, subjective wheeze or overt sinus or hb symptoms.    Also denies any obvious fluctuation of symptoms with weather or environmental changes or other aggravating or alleviating factors except as outlined above   No unusual exposure hx or h/o childhood pna/ asthma or knowledge of premature birth.  Current Allergies, Complete Past Medical History, Past Surgical History, Family History, and Social History were reviewed in [employer] record.  ROS  The following are not active complaints unless bolded Hoarseness, sore throat, dysphagia, dental problems, itching, sneezing,  nasal congestion or discharge of excess mucus or purulent secretions, ear ache,   fever, chills, sweats, unintended wt loss or wt gain, classically pleuritic or exertional cp,  orthopnea pnd or arm/hand swelling  or leg swelling, presyncope, palpitations, abdominal pain, anorexia, nausea, vomiting, diarrhea  or  change in bowel habits or change in bladder habits, change in stools or change in urine, dysuria, hematuria,  rash, arthralgias, visual complaints, headache, numbness, weakness or ataxia or problems with walking or coordination,  change in mood or  memory.        Current Meds  Medication Sig   albuterol (PROVENTIL HFA;VENTOLIN HFA) 108 (90 BASE) MCG/ACT inhaler Inhale 2 puffs into the lungs every 6 (six) hours as needed for wheezing or shortness of breath.    aspirin EC 81 MG tablet Take 81 mg by mouth daily. Swallow whole.   atorvastatin (LIPITOR) 40 MG tablet Take 40 mg by mouth every other day.    celecoxib  (CELEBREX) 200 MG capsule Take 200 mg by mouth every other day.    famotidine (PEPCID) 20 MG tablet One after supper   furosemide (LASIX) 20 MG tablet Take 1 tablet by mouth daily.   losartan (COZAAR) 25 MG tablet Take 1 tablet by mouth daily.   MYRBETRIQ 50 MG TB24 tablet Take 50 mg by mouth daily.   ondansetron (ZOFRAN) 8 MG tablet Take 1 tablet (8 mg total) by mouth every 4 (four) hours as needed.   oxyCODONE-acetaminophen (PERCOCET/ROXICET) 5-325 MG tablet Take 1 tablet by mouth every 4 (four) hours as needed for severe pain.   pantoprazole (PROTONIX) 40 MG tablet Take 1 tablet (40 mg total) by mouth daily. Take 30-60 min before first meal of the day   polyethylene glycol (MIRALAX / GLYCOLAX) packet Take 17 g by mouth daily as needed for moderate constipation.   Probiotic Product (DIGESTIVE ADVANTAGE GUMMIES PO) Take 2 each by mouth daily.   senna (SENOKOT) 8.6 MG tablet Take 2 tablets by mouth at bedtime.   Venlafaxine HCl 150 MG TB24 Take 1 tablet by mouth daily.   [DISCONTINUED] doxepin (SINEQUAN) 10 MG capsule Take 1 capsule (10 mg total) by mouth 3 (three) times daily.           Past Medical History:  Diagnosis Date   Anxiety    Asthma    Bladder spasms    DDD (degenerative disc disease)    GERD (gastroesophageal reflux disease)    Hypercholesteremia    Sciatic nerve disease    Sleep apnea       Objective:    Wt Readings from Last 3 Encounters:  09/26/22 191 lb (86.6 kg)  08/29/22 197 lb (89.4 kg)  05/10/22 220 lb 0.3 oz (99.8 kg)      Vital signs reviewed  09/26/2022  - Note at rest 02 sats  92% on RA   General appearance:    somber wf comes to tears relating her symptoms of doe with activity and at rest that she did not disclose previously while same time saying"but my breahting is better"    HEENT : Oropharynx  clear, no pnd/cobblestoning      Nasal turbinates nl    NECK :  without  apparent JVD/ palpable Nodes/TM    LUNGS: no acc muscle use,  Nl contour  with mild  insp squeaks/ exp wheeze bilaterally  bilaterally with cough on exp maneuvers   CV:  RRR  no s3 or murmur or increase in P2, and no edema   ABD:  soft and nontender with nl inspiratory excursion in the supine position. No bruits or organomegaly appreciated   MS:  Nl gait/ ext warm without deformities Or obvious joint restrictions  calf tenderness, cyanosis or clubbing    SKIN: warm and dry without lesions  NEURO:  alert, approp, nl sensorium with  no motor or cerebellar deficits apparent.             Assessment

## 2022-09-26 ENCOUNTER — Ambulatory Visit: Payer: Medicare PPO | Admitting: Internal Medicine

## 2022-09-26 ENCOUNTER — Encounter: Payer: Self-pay | Admitting: Internal Medicine

## 2022-09-26 VITALS — BP 144/87 | HR 101 | Ht 60.0 in | Wt 191.0 lb

## 2022-09-26 DIAGNOSIS — R0609 Other forms of dyspnea: Secondary | ICD-10-CM | POA: Diagnosis not present

## 2022-09-26 DIAGNOSIS — R058 Other specified cough: Secondary | ICD-10-CM

## 2022-09-26 MED ORDER — METHYLPREDNISOLONE ACETATE 80 MG/ML IJ SUSP
120.0000 mg | Freq: Once | INTRAMUSCULAR | Status: AC
Start: 2022-09-26 — End: 2022-09-26
  Administered 2022-09-26: 120 mg via INTRAMUSCULAR

## 2022-09-26 MED ORDER — ALBUTEROL SULFATE HFA 108 (90 BASE) MCG/ACT IN AERS: INHALATION_SPRAY | RESPIRATORY_TRACT | 1 refills | Status: DC

## 2022-09-26 NOTE — Patient Instructions (Signed)
Plan A = Automatic = Always=    Flovent 110 Take 2 puffs first thing in am and then another 2 puffs about 12 hours later.    Work on inhaler technique:  relax and gently blow all the way out then take a nice smooth full deep breath back in, triggering the inhaler at same time you start breathing in.  Hold breath in for at least  5 seconds if you can. Blow out flovent  thru nose. Rinse and gargle with water when done.  If mouth or throat bother you at all,  try brushing teeth/gums/tongue with arm and hammer toothpaste/ make a slurry and gargle and spit out.      Plan B = Backup (to supplement plan A, not to replace it) Only use your albuterol inhaler as a rescue medication to be used if you can't catch your breath by resting or doing a relaxed purse lip breathing pattern.  - The less you use it, the better it will work when you need it. - Ok to use the inhaler up to 2 puffs  every 4 hours if you must but call for appointment if use goes up over your usual need - Don't leave home without it !!  (think of it like the spare tire for your car)   Also  Ok to try albuterol x 2 puffs  15 min before an activity (on alternating days)  that you know would usually make you short of breath and see if it makes any difference and if makes none then don't take albuterol after activity unless you can't catch your breath as this means it's the resting that helps, not the albuterol.  Depomedrol 120 mg IM   Try take CHLORPHENIRAMINE  4 mg  ("Allergy Relief" 4mg  over the counter   at [facility] should be easiest to find in the blue box usually on bottom shelf)  take   a dose or two an hour before bedtime and see if it helps you lie down more comfrtably at night without coughing or short of breath  Please remember to go to the lab department   for your tests - we will call you with the results when they are available.      Please schedule a follow up office visit in 4 weeks, sooner if needed  with all   respiratory medications /inhalers (ones I have recommended) in hand so we can verify exactly what you are taking. This includes all medications from all doctors and over the counters

## 2022-09-27 NOTE — Assessment & Plan Note (Addendum)
Onset ?  - 09/26/2022   Walked on RA  x  3  lap(s) =  approx 450  ft  @ mod pace, stopped due to end of study with lowest 02 sats 94% and no sob   Unable to reproduce this symptom in office and suspect deconditioning is the main problem but advised:  Also  Ok to try albuterol 15 min before an activity (on alternating days)  that you know would usually make you short of breath and see if it makes any difference and if makes none then don't take albuterol after activity unless you can't catch your breath as this means it's the resting that helps, not the albuterol.  >>> check do labs w/a   Each maintenance medication was reviewed in detail including emphasizing most importantly the difference between maintenance and prns and under what circumstances the prns are to be triggered using an action plan format where appropriate.  Total time for H and P, chart review, counseling, reviewing hfa device(s) , directly observing portions of ambulatory 02 saturation study/ and generating customized AVS unique to this office visit / same day charting > 40 min for multiple chronic  refractory respiratory  symptoms of uncertain etiology

## 2022-09-27 NOTE — Assessment & Plan Note (Addendum)
Onset as child and never resolved  - 08/29/2022 try off advair and on max gerd rx - 09/26/2022  After extensive coaching inhaler device,  effectiveness =    75% from a baseline of 25% > resume flovent 110 p depomedrol 120 mg IM  - 09/26/2022 added hs 1st gen H1 blockers per guidelines  for noct coughing  The standardized cough guidelines published in [employer] by [place] in 2006 are still the best available and consist of a multiple step process (up to 12!) , not a single office visit,  and are intended  to address this problem logically,  with an alogrithm dependent on response to empiric treatment at  each progressive step  to determine a specific diagnosis with  minimal addtional testing needed. Therefore if adherence is an issue or can't be accurately verified,  it's very unlikely the standard evaluation and treatment will be successful here.    Furthermore, response to therapy (other than acute cough suppression, which should only be used short term with avoidance of narcotic containing cough syrups if possible), can be a gradual process for which the patient is not likely to  perceive immediate benefit.  Unlike going to an eye doctor where the best perscription is almost always the first one and is immediately effective, this is almost never the case in the management of chronic cough syndromes. Therefore the patient needs to commit up front to consistently adhere to recommendations  for up to 6 weeks of therapy directed at the likely underlying problem(s) before the response can be reasonably evaluated.   >>> rx as above and return with all meds in hand using a trust but verify approach to confirm accurate Medication  Reconciliation The principal here is that until we are certain that the  patients are doing what we've asked, it makes no sense to ask them to do more.

## 2022-10-01 ENCOUNTER — Ambulatory Visit: Payer: Medicare PPO | Admitting: Pulmonary Disease

## 2022-10-01 DIAGNOSIS — G4710 Hypersomnia, unspecified: Secondary | ICD-10-CM

## 2022-10-01 DIAGNOSIS — R0683 Snoring: Secondary | ICD-10-CM

## 2022-10-01 DIAGNOSIS — R5383 Other fatigue: Secondary | ICD-10-CM

## 2022-10-01 LAB — CBC WITH DIFFERENTIAL/PLATELET
Basophils Absolute: 0.1 10*3/uL (ref 0.0–0.2)
Basos: 1 %
EOS (ABSOLUTE): 0.2 10*3/uL (ref 0.0–0.4)
Eos: 3 %
Hematocrit: 44.4 % (ref 34.0–46.6)
Hemoglobin: 14.4 g/dL (ref 11.1–15.9)
Immature Grans (Abs): 0 10*3/uL (ref 0.0–0.1)
Immature Granulocytes: 0 %
Lymphocytes Absolute: 1 10*3/uL (ref 0.7–3.1)
Lymphs: 17 %
MCH: 30.6 pg (ref 26.6–33.0)
MCHC: 32.4 g/dL (ref 31.5–35.7)
MCV: 94 fL (ref 79–97)
Monocytes Absolute: 0.6 10*3/uL (ref 0.1–0.9)
Monocytes: 10 %
Neutrophils Absolute: 3.9 10*3/uL (ref 1.4–7.0)
Neutrophils: 69 %
Platelets: 231 10*3/uL (ref 150–450)
RBC: 4.71 x10E6/uL (ref 3.77–5.28)
RDW: 12.7 % (ref 11.7–15.4)
WBC: 5.7 10*3/uL (ref 3.4–10.8)

## 2022-10-01 LAB — TSH: TSH: 0.952 u[IU]/mL (ref 0.450–4.500)

## 2022-10-01 LAB — IGE: IgE (Immunoglobulin E), Serum: 57 [IU]/mL (ref 6–495)

## 2022-10-01 LAB — BRAIN NATRIURETIC PEPTIDE: BNP: 50.2 pg/mL (ref 0.0–100.0)

## 2022-10-13 DIAGNOSIS — I10 Essential (primary) hypertension: Secondary | ICD-10-CM | POA: Diagnosis not present

## 2022-10-13 DIAGNOSIS — R7989 Other specified abnormal findings of blood chemistry: Secondary | ICD-10-CM | POA: Diagnosis not present

## 2022-10-13 DIAGNOSIS — R0602 Shortness of breath: Secondary | ICD-10-CM | POA: Diagnosis not present

## 2022-10-14 ENCOUNTER — Ambulatory Visit: Payer: Medicare PPO | Attending: Internal Medicine | Admitting: Pulmonary Disease

## 2022-10-14 DIAGNOSIS — R0683 Snoring: Secondary | ICD-10-CM | POA: Diagnosis not present

## 2022-10-14 DIAGNOSIS — G4733 Obstructive sleep apnea (adult) (pediatric): Secondary | ICD-10-CM | POA: Diagnosis not present

## 2022-10-14 DIAGNOSIS — G4710 Hypersomnia, unspecified: Secondary | ICD-10-CM | POA: Diagnosis not present

## 2022-10-14 DIAGNOSIS — R5383 Other fatigue: Secondary | ICD-10-CM

## 2022-10-20 DIAGNOSIS — I10 Essential (primary) hypertension: Secondary | ICD-10-CM | POA: Diagnosis not present

## 2022-10-20 DIAGNOSIS — R0600 Dyspnea, unspecified: Secondary | ICD-10-CM | POA: Diagnosis not present

## 2022-10-20 DIAGNOSIS — Z6832 Body mass index (BMI) 32.0-32.9, adult: Secondary | ICD-10-CM | POA: Diagnosis not present

## 2022-10-22 NOTE — Procedures (Signed)
Patient Name: Barbara [name], Barbara [name] Date: 10/14/2022 Gender: Female D.O.B: 06/18/1945 Age (years): 76 Referring Provider: Donetta Potts Height (inches): 60 Interpreting Physician: Cyril Mourning MD, ABSM Weight (lbs): 191 RPSGT: Alfonso Ellis BMI: 37 MRN: [id number] Neck Size: <br> <br> <br> CLINICAL INFORMATION Sleep Study Type: HST    Indication for sleep study: snoring, non refreshing sleep    Epworth Sleepiness Score: N/A  SLEEP STUDY TECHNIQUE A multi-channel overnight portable sleep study was performed. The channels recorded were: nasal airflow, thoracic respiratory movement, and oxygen saturation with a pulse oximetry. Snoring was also monitored.  MEDICATIONS Patient self administered medications include: N/A.  SLEEP ARCHITECTURE Patient was studied for 468.9 minutes. The sleep efficiency was 97.7 % and the patient was supine for 0%. The arousal index was 0.0 per hour.  RESPIRATORY PARAMETERS The overall AHI was 14.5 per hour, with a central apnea index of 0 per hour.  The oxygen nadir was 77% during sleep.   CARDIAC DATA Mean heart rate during sleep was 65.7 bpm.  IMPRESSIONS - Mild -moderate obstructive sleep apnea occurred during this study (AHI = 14.5/h). - Severe oxygen desaturation was noted during this study (Min O2 = 77%). - Patient snored 1.3% during the sleep.   DIAGNOSIS - Obstructive Sleep Apnea (G47.33)   RECOMMENDATIONS - Therapeutic CPAP titration to determine optimal pressure required to alleviate sleep disordered breathing. - Oral appliance may be considered. - Avoid alcohol, sedatives and other CNS depressants that may worsen sleep apnea and disrupt normal sleep architecture. - Sleep hygiene should be reviewed to assess factors that may improve sleep quality. - Weight management and regular exercise should be initiated or continued. - Consider sleep consultation  [Electronically signed] 10/22/2022 06:58 PM  Cyril Mourning MD,  ABSM Diplomate, American Board of Sleep Medicine NPI: [id number]

## 2022-10-23 DIAGNOSIS — G4733 Obstructive sleep apnea (adult) (pediatric): Secondary | ICD-10-CM | POA: Diagnosis not present

## 2022-10-23 DIAGNOSIS — R0683 Snoring: Secondary | ICD-10-CM | POA: Diagnosis not present

## 2022-10-26 NOTE — Progress Notes (Unsigned)
Barbara [name], female    DOB: 10-06-1945    MRN: [id number]   Brief patient profile:  59  yowf  never smoked but lived in  house of smokers growing up >>>  pattern of recurrent / almost daily cough as long as she can remember on daily inhaler x ? Since early 2000  much worse cough x May 2024 assoc sob  referred to pulmonary clinic in [facility]  08/29/2022 by Dr Mayford Knife for eval of worsening cough and sob with classic pseudowheeze at initial eval.    History of Present Illness  08/29/2022  Pulmonary/ 1st office eval/ Sonda Coppens / [facility] maint on advair  Chief Complaint  Patient presents with   Establish Care   Shortness of Breath  Dyspnea:  still able shop / cross parking lot/ slower than others/ leaning on cart at grocery store doe Cough: no production / worse immediately on lying down  Sleep: bed is flat/ wedge pillow  helps, otherwise cough much worse hs  SABA use: albuterol doesn't help/ last used one day prior, same with advair  02: none  Rec Stop advair and omeprazole  Depomedrol 120 mg IM   Pantoprazole (protonix) 40 mg   Take  30-60 min before first meal of the day and Pepcid (famotidine)  20 mg after supper until return to office - this is the best way to tell whether stomach acid is contributing to your problem.  GERD diet reviewed, bed blocks rec  Take Delsym two tsp every 12 hours and supplement if needed with  oxycodone every 4 hours to suppress the urge to cough.   Once you have eliminated the cough for 3 straight days try reducing the oxycodone  first,  then the delsym as tolerated.    For breahting > albuterol (Ventolin) 2 puffs up to every 4 hours as needed. Please schedule a follow up office visit in 4 weeks, sooner if needed  with all medications /inhalers/ solutions in hand      09/26/2022  f/u ov/[place] office/Chariti Havel re: cough since ? Childhood?  maint on protonix  / Flovent 110  Chief Complaint  Patient presents with   Upper airway cough syndrome   Dyspnea:  [employer] at slow speed/ slow across the parking lot  Cough: immediately on lying down does not keep her up / not as bad as it was / no pattern to daytime cough / non productive  Sleeping: flat bed/ one pillow resp cc  SABA use: none  02: none   Rec Plan A = Automatic = Always=    Flovent 110 Take 2 puffs first thing in am and then another 2 puffs about 12 hours later.  Work on inhaler technique:    Plan B = Backup (to supplement plan A, not to replace it) Only use your albuterol inhaler as a rescue medication  Also  Ok to try albuterol x 2 puffs  15 min before an activity (on alternating days)  that you know would usually make you short of breath  Depomedrol 120 mg IM  Try take CHLORPHENIRAMINE  4 mg  ("Allergy Relief" 4mg  over the counter   Please schedule a follow up office visit in 4 weeks, sooner if needed  with all  respiratory medications /inhalers (ones I have recommended) in hand      - Allergy screen 09/26/22  >  Eos 0.2 /  IgE  57   10/28/2022  f/u ov/[place] office/Kaoru Benda re: cough since childohood  maint  on not  did not  bring meds and thoroughly confused with names  Chief Complaint  Patient presents with   Follow-up    4 week follow up  Dyspnea:  usual slow steady  Cough: making progress but w/in a few min but doesn't wake her up  and at hs  just a cough or two but then sob x 10 min and resolves spontaneously and able to sleep thru night  Sleeping: bed blocks x 4 in s   resp cc  SABA use: not sure  02: none     No obvious day to day or daytime variability or assoc excess/ purulent sputum or mucus plugs or hemoptysis or cp or chest tightness, subjective wheeze or overt sinus or hb symptoms.    Also denies any obvious fluctuation of symptoms with weather or environmental changes or other aggravating or alleviating factors except as outlined above   No unusual exposure hx or knowledge of premature birth.  Current Allergies, Complete Past Medical History,  Past Surgical History, Family History, and Social History were reviewed in [employer] record.  ROS  The following are not active complaints unless bolded Hoarseness, sore throat, dysphagia, dental problems, itching, sneezing,  nasal congestion or discharge of excess mucus or purulent secretions, ear ache,   fever, chills, sweats, unintended wt loss or wt gain, classically pleuritic or exertional cp,  orthopnea pnd or arm/hand swelling  or leg swelling, presyncope, palpitations, abdominal pain, anorexia, nausea, vomiting, diarrhea  or change in bowel habits or change in bladder habits, change in stools or change in urine, dysuria, hematuria,  rash, arthralgias, visual complaints, headache, numbness, weakness or ataxia or problems with walking or coordination,  change in mood or  memory.        Current Meds  Medication Sig   albuterol (PROAIR HFA) 108 (90 Base) MCG/ACT inhaler 2 puffs every 4 hours as needed only  if your can't catch your breath   aspirin EC 81 MG tablet Take 81 mg by mouth daily. Swallow whole.   atorvastatin (LIPITOR) 40 MG tablet Take 40 mg by mouth every other day.    celecoxib (CELEBREX) 200 MG capsule Take 200 mg by mouth every other day.    famotidine (PEPCID) 20 MG tablet One after supper   furosemide (LASIX) 20 MG tablet Take 1 tablet by mouth daily.   losartan (COZAAR) 25 MG tablet Take 1 tablet by mouth daily.   MYRBETRIQ 50 MG TB24 tablet Take 50 mg by mouth daily.   ondansetron (ZOFRAN) 8 MG tablet Take 1 tablet (8 mg total) by mouth every 4 (four) hours as needed.   oxyCODONE-acetaminophen (PERCOCET/ROXICET) 5-325 MG tablet Take 1 tablet by mouth every 4 (four) hours as needed for severe pain.   pantoprazole (PROTONIX) 40 MG tablet Take 1 tablet (40 mg total) by mouth daily. Take 30-60 min before first meal of the day   polyethylene glycol (MIRALAX / GLYCOLAX) packet Take 17 g by mouth daily as needed for moderate constipation.   Probiotic  Product (DIGESTIVE ADVANTAGE GUMMIES PO) Take 2 each by mouth daily.   senna (SENOKOT) 8.6 MG tablet Take 2 tablets by mouth at bedtime.   Venlafaxine HCl 150 MG TB24 Take 1 tablet by mouth daily.         Past Medical History:  Diagnosis Date   Anxiety    Asthma    Bladder spasms    DDD (degenerative disc disease)    GERD (gastroesophageal reflux disease)  Hypercholesteremia    Sciatic nerve disease    Sleep apnea       Objective:    Wts    10/28/2022       185   09/26/22 191 lb (86.6 kg)  08/29/22 197 lb (89.4 kg)  05/10/22 220 lb 0.3 oz (99.8 kg)      Vital signs reviewed  10/28/2022  - Note at rest 02 sats  92% on RA   General appearance:    amb mod obese somber wf / mostly pw  and no longer cough on exp maneuvers   HEENT : Oropharynx  clear      Nasal turbinates nl    NECK :  without  apparent JVD/ palpable Nodes/TM    LUNGS: no acc muscle use,  Nl contour chest which is clear to A and P bilaterally without cough on insp or exp maneuvers   CV:  RRR  no s3 or murmur or increase in P2, and no edema   ABD:  soft and nontender with nl inspiratory excursion in the supine position. No bruits or organomegaly appreciated   MS:  Nl gait/ ext warm without deformities Or obvious joint restrictions  calf tenderness, cyanosis or clubbing    SKIN: warm and dry without lesions    NEURO:  alert, approp, nl sensorium with  no motor or cerebellar deficits apparent.             Assessment

## 2022-10-28 ENCOUNTER — Ambulatory Visit: Payer: Medicare PPO | Admitting: Internal Medicine

## 2022-10-28 ENCOUNTER — Encounter: Payer: Self-pay | Admitting: Internal Medicine

## 2022-10-28 VITALS — BP 127/70 | HR 85 | Ht 60.0 in | Wt 185.4 lb

## 2022-10-28 DIAGNOSIS — R058 Other specified cough: Secondary | ICD-10-CM

## 2022-10-28 DIAGNOSIS — R0609 Other forms of dyspnea: Secondary | ICD-10-CM

## 2022-10-28 MED ORDER — METHYLPREDNISOLONE ACETATE 80 MG/ML IJ SUSP
80.0000 mg | Freq: Once | INTRAMUSCULAR | Status: AC
Start: 2022-10-28 — End: 2022-10-28
  Administered 2022-10-28: 80 mg via INTRAMUSCULAR

## 2022-10-28 NOTE — Patient Instructions (Signed)
Plan A = Automatic = Always=    Flovent 110 Take 2 puffs first thing in am and then another 2 puffs about 12 hours later.    Work on inhaler technique:  relax and gently blow all the way out then take a nice smooth full deep breath back in, triggering the inhaler at same time you start breathing in.  Hold breath in for at least  5 seconds if you can. Blow out flovent  thru nose. Rinse and gargle with water when done.  If mouth or throat bother you at all,  try brushing teeth/gums/tongue with arm and hammer toothpaste/ make a slurry and gargle and spit out.      Plan B = Backup (to supplement plan A, not to replace it) Only use your albuterol inhaler as a rescue medication to be used if you can't catch your breath by resting or doing a relaxed purse lip breathing pattern.  - The less you use it, the better it will work when you need it. - Ok to use the inhaler up to 2 puffs  every 4 hours if you must but call for appointment if use goes up over your usual need - Don't leave home without it !!  (think of it like the spare tire for your car)   Also  Ok to try albuterol x 2 puffs  15 min before an activity (on alternating days)  that you know would usually make you short of breath and see if it makes any difference and if makes none then don't take albuterol after activity unless you can't catch your breath as this means it's the resting that helps, not the albuterol.  Depomedrol 120 mg IM   Try take CHLORPHENIRAMINE  4 mg  ("Allergy Relief" 4mg  over the counter(ask your pharmacists)    at [facility] should be easiest to find in the blue box usually on bottom shelf)  take   a dose or two an hour before bedtime and see if it helps you lie down more comfrtably at night without coughing or short of breath    Please schedule a follow up office visit in 4 weeks, sooner if needed  with all  medications /inhalers (ones I have recommended) in hand so we can verify exactly what you are taking. This  includes all medications from all doctors and over the counters

## 2022-10-29 DIAGNOSIS — I10 Essential (primary) hypertension: Secondary | ICD-10-CM | POA: Diagnosis not present

## 2022-10-29 DIAGNOSIS — R0600 Dyspnea, unspecified: Secondary | ICD-10-CM | POA: Diagnosis not present

## 2022-10-29 DIAGNOSIS — F331 Major depressive disorder, recurrent, moderate: Secondary | ICD-10-CM | POA: Diagnosis not present

## 2022-10-29 DIAGNOSIS — Z6833 Body mass index (BMI) 33.0-33.9, adult: Secondary | ICD-10-CM | POA: Diagnosis not present

## 2022-10-29 DIAGNOSIS — F32A Depression, unspecified: Secondary | ICD-10-CM | POA: Diagnosis not present

## 2022-10-29 DIAGNOSIS — E7801 Familial hypercholesterolemia: Secondary | ICD-10-CM | POA: Diagnosis not present

## 2022-10-29 DIAGNOSIS — R0602 Shortness of breath: Secondary | ICD-10-CM | POA: Diagnosis not present

## 2022-10-29 NOTE — Assessment & Plan Note (Addendum)
Onset as child and never resolved  - 08/29/2022 try off advair and on max gerd rx - 09/26/2022  After extensive coaching inhaler device,  effectiveness =    75% from a baseline of 25% > resume flovent 110 p depomedrol 120 mg IM  - 09/26/2022 added hs 1st gen H1 blockers per guidelines  for noct coughing - Allergy screen 09/26/22  >  Eos 0.2 /  IgE  59  >>> rx depomedrol 120 mg IM empirically and add  h1 at hs/ no change in other recs, return in 4 weeks  this time with all meds in hand using a trust but verify approach to confirm accurate Medication  Reconciliation The principal here is that until we are certain that the  patients are doing what we've asked, it makes no sense to ask them to do more.   I told her it was dangerous to see doctors without the provider knowing a complete version of exactly what medications she is taking or to try to adjust them when she doesn't know which one is which.

## 2022-10-29 NOTE — Assessment & Plan Note (Signed)
Onset ?  - 09/26/2022   Walked on RA  x  3  lap(s) =  approx 450  ft  @ mod pace, stopped due to end of study with lowest 02 sats 94% and no sob  10/28/2022   Walked on RA  x  3  lap(s) =  approx 450  ft  @ mod pace, stopped due to end of study with lowest 02 sats 91% on initial lap and up to 96% at end with no sob/ limited by R leg pain   Strongly suspect this is a conditioning/ orthopedic issue and no further w/u planned for now  Each maintenance medication was reviewed in detail including emphasizing most importantly the difference between maintenance and prns and under what circumstances the prns are to be triggered using an action plan format where appropriate.  Total time for H and P, chart review, counseling, reviewing hfa device(s) , directly observing portions of ambulatory 02 saturation study/ and generating customized AVS unique to this office visit / same day charting = > 30 min for multiple  refractory respiratory  symptoms of uncertain etiology

## 2022-12-02 ENCOUNTER — Encounter: Payer: Self-pay | Admitting: Internal Medicine

## 2022-12-02 ENCOUNTER — Ambulatory Visit: Payer: Medicare PPO | Admitting: Internal Medicine

## 2022-12-02 VITALS — BP 131/79 | HR 81 | Wt 180.0 lb

## 2022-12-02 DIAGNOSIS — R0609 Other forms of dyspnea: Secondary | ICD-10-CM | POA: Diagnosis not present

## 2022-12-02 DIAGNOSIS — R058 Other specified cough: Secondary | ICD-10-CM

## 2022-12-02 NOTE — Progress Notes (Signed)
Barbara [name], female    DOB: [birth year]    MRN: [id number]   Brief patient profile:  59  yowf  never smoked but lived in  house of smokers growing up >>>  pattern of recurrent / almost daily cough as long as she can remember  on daily inhaler x ? Since early 2022 much worse cough x May 2024 assoc sob  referred to pulmonary clinic in [facility]  08/29/2022 by Dr Mayford Knife for eval of worsening cough and sob with classic pseudowheeze at initial eval.    History of Present Illness  08/29/2022  Pulmonary/ 1st [name] eval/ Barbara [name] / [place] [name] maint on advair  Chief Complaint  Patient presents with   Establish Care   Shortness of Breath  Dyspnea:  still able shop / cross parking lot/ slower than others/ leaning on cart at grocery store doe Cough: no production / worse immediately on lying down  Sleep: bed is flat/ wedge pillow  helps, otherwise cough much worse hs  SABA use: albuterol doesn't help/ last used one day prior, same with advair  02: none  Rec Stop advair and omeprazole  Depomedrol 120 mg IM   Pantoprazole (protonix) 40 mg   Take  30-60 min before first meal of the day and Pepcid (famotidine)  20 mg after supper until return to [name]  GERD diet reviewed, bed blocks rec  Take Delsym two tsp every 12 hours and supplement if needed with  oxycodone every 4 hours to suppress the urge to cough.   Once you have eliminated the cough for 3 straight days try reducing the oxycodone  first,  then the delsym as tolerated.    For breathing  > albuterol (Ventolin) 2 puffs up to every 4 hours as needed. Please schedule a follow up [name] visit in 4 weeks, sooner if needed  with all medications /inhalers/ solutions in hand      - Allergy screen 09/26/22  >  Eos 0.2 /  IgE  59   10/28/2022  f/u ov/Barbara [name]/Barbara [name] re: cough since childohood  maint on ? not  did not  bring meds and thoroughly confused with names  Chief Complaint  Patient presents with   Follow-up    4 week follow up   Dyspnea:  usual slow steady  Cough: making progress but w/in a few min but doesn't wake her up  and at hs  just a cough or two but then sob x 10 min and resolves spontaneously and able to sleep thru night  Sleeping: bed blocks x 4 in s   resp cc  SABA use: not sure  02: none    Rec Plan A = Automatic = Always=    Flovent 110 Take 2 puffs first thing in am and then another 2 puffs about 12 hours later.  Work on inhaler technique:   Plan B = Backup (to supplement plan A, not to replace it) Only use your albuterol inhaler as a rescue medication  Also  Ok to try albuterol x 2 puffs  15 min before an activity (on alternating days)  that you know would usually make you short of breath  Depomedrol 120 mg IM  Try take CHLORPHENIRAMINE  4 mg  ("Allergy Relief" 4mg  over the counter(ask your pharmacists)     Please schedule a follow up [name] visit in 4 weeks, sooner if needed  with all  medications /inhalers (ones I have recommended) in hand    12/02/2022  f/u ov/[place]  [name]/Barbara [name] re: cough entire life  maint on flovent 110  did  bring meds but not inhalers "  I know how to use them"  Chief Complaint  Patient presents with   Upper airway cough syndrome  Dyspnea:  slowed more by back/ lges  Cough: back to baseline dry daytime throat clearlng "all her life" Sleeping: bed blocks s  resp cc  SABA use: twice daily  02: none     No obvious day to day or daytime variability or assoc excess/ purulent sputum or mucus plugs or hemoptysis or cp or chest tightness, subjective wheeze or overt sinus or hb symptoms.    Also denies any obvious fluctuation of symptoms with weather or environmental changes or other aggravating or alleviating factors except as outlined above   No unusual exposure hx or h/o childhood pna/ asthma or knowledge of premature birth.  Current Allergies, Complete Past Medical History, Past Surgical History, Family History, and Social History were reviewed in [employer] record.  ROS  The following are not active complaints unless bolded Hoarseness, sore throat, dysphagia, dental problems, itching, sneezing,  nasal congestion or discharge of excess mucus or purulent secretions, ear ache,   fever, chills, sweats, unintended wt loss or wt gain, classically pleuritic or exertional cp,  orthopnea pnd or arm/hand swelling  or leg swelling, presyncope, palpitations, abdominal pain, anorexia, nausea, vomiting, diarrhea  or change in bowel habits or change in bladder habits, change in stools or change in urine, dysuria, hematuria,  rash, arthralgias, visual complaints, headache, numbness, weakness or ataxia or problems with walking or coordination,  change in mood or  memory.        Current Meds  Medication Sig   albuterol (PROAIR HFA) 108 (90 Base) MCG/ACT inhaler 2 puffs every 4 hours as needed only  if your can't catch your breath   aspirin EC 81 MG tablet Take 81 mg by mouth daily. Swallow whole.   atorvastatin (LIPITOR) 40 MG tablet Take 40 mg by mouth every other day.    celecoxib (CELEBREX) 200 MG capsule Take 200 mg by mouth every other day.    famotidine (PEPCID) 20 MG tablet One after supper   furosemide (LASIX) 20 MG tablet Take 1 tablet by mouth daily.   losartan (COZAAR) 25 MG tablet Take 1 tablet by mouth daily.   MYRBETRIQ 50 MG TB24 tablet Take 50 mg by mouth daily.   ondansetron (ZOFRAN) 8 MG tablet Take 1 tablet (8 mg total) by mouth every 4 (four) hours as needed.   oxyCODONE-acetaminophen (PERCOCET/ROXICET) 5-325 MG tablet Take 1 tablet by mouth every 4 (four) hours as needed for severe pain.   pantoprazole (PROTONIX) 40 MG tablet Take 1 tablet (40 mg total) by mouth daily. Take 30-60 min before first meal of the day   polyethylene glycol (MIRALAX / GLYCOLAX) packet Take 17 g by mouth daily as needed for moderate constipation.   Probiotic Product (DIGESTIVE ADVANTAGE GUMMIES PO) Take 2 each by mouth daily.   senna (SENOKOT) 8.6 MG  tablet Take 2 tablets by mouth at bedtime.   Venlafaxine HCl 150 MG TB24 Take 1 tablet by mouth daily.         Past Medical History:  Diagnosis Date   Anxiety    Asthma    Bladder spasms    DDD (degenerative disc disease)    GERD (gastroesophageal reflux disease)    Hypercholesteremia    Sciatic nerve disease    Sleep apnea  Objective:    Wts  12/02/2022        180  10/28/2022       185   09/26/22 191 lb (86.6 kg)  08/29/22 197 lb (89.4 kg)  05/10/22 220 lb 0.3 oz (99.8 kg)      Vital signs reviewed  12/02/2022  - Note at rest 02 sats  94% on RA   General appearance:    obese wf prominent pseudowheeze  / did not use saba today   HEENT : Oropharynx  clear      Nasal turbinates nl    NECK :  without  apparent JVD/ palpable Nodes/TM    LUNGS: no acc muscle use,  Nl contour chest which is clear to A and P bilaterally with purse lip breathing.    CV:  RRR  no s3 or murmur or increase in P2, and no edema   ABD:  obese soft and nontender    MS:  Nl gait/ ext warm without deformities Or obvious joint restrictions  calf tenderness, cyanosis or clubbing    SKIN: warm and dry without lesions    NEURO:  alert, approp, nl sensorium with  no motor or cerebellar deficits apparent.       I personally reviewed images and agree with radiology impression as follows:   Chest CT/07/16/22    w contrast Central airways are patent. Bilateral mosaic  attenuation. Elevation of the right hemidiaphragm and  right-greater-than-left atelectasis.         Assessment

## 2022-12-02 NOTE — Patient Instructions (Addendum)
Also  Ok to try albuterol 2 puffs x  15 min before an activity (on alternating days)  that you know would usually make you short of breath and see if it makes any difference and if makes none then don't take albuterol after activity unless you can't catch your breath as this means it's the resting that helps, not the albuterol.  If doing much better after albuterol, call me and we'll start you on Symbioct 80 Take 2 puffs first thing in am and then another 2 puffs about 12 hours later.    My office will be contacting you by phone for referral for PFTs  - if you don't hear back from my office within one week please call us back or notify us thru MyChart and we'll address it right away.   Work on inhaler technique:  relax and gently blow all the way out then take a nice smooth full deep breath back in, triggering the inhaler at same time you start breathing in.  Hold breath in for at least  5 seconds if you can. Blow out flovent  thru nose. Rinse and gargle with water when done.  If mouth or throat bother you at all,  try brushing teeth/gums/tongue with arm and hammer toothpaste/ make a slurry and gargle and spit out.       Please schedule a follow up office visit in 6 weeks, call sooner if needed with all medications /inhalers/ solutions in hand so we can verify exactly what you are taking. This includes all medications from all doctors and over the counters

## 2022-12-02 NOTE — Assessment & Plan Note (Signed)
Onset as child and never resolved  - 08/29/2022 try off advair and on max gerd rx - 09/26/2022  After extensive coaching inhaler device,  effectiveness =    75% from a baseline of 25% > resume flovent 110 p depomedrol 120 mg IM  - 09/26/2022 added hs 1st gen H1 blockers per guidelines  for noct coughing - Allergy screen 09/26/22  >  Eos 0.2 /  IgE  57  Stongly doubt asthma here but it is possible - if so would avoid inhalers that aggravate UACS but esp advair in favor or either symbicort 80 or dulera 100  and sort this out based on response to saba :  Re SABA :  I spent extra time with pt today reviewing appropriate use of albuterol for prn use on exertion with the following points: 1) saba is for relief of sob that does not improve by walking a slower pace or resting but rather if the pt does not improve after trying this first. 2) If the pt is convinced, as many are, that saba helps recover from activity faster then it's easy to tell if this is the case by re-challenging : ie stop, take the inhaler, then p 5 minutes try the exact same activity (intensity of workload) that just caused the symptoms and see if they are substantially diminished or not after saba 3) if there is an activity that reproducibly causes the symptoms, try the saba 15 min before the activity on alternate days   If in fact the saba really does help, then fine to continue to use it prn but advised may need to look closer at the maintenance regimen being used (now flovent but low threshold for laba/ics as above) to achieve better control of airways disease with exertion.   - The proper method of use, as well as anticipated side effects, of a metered-dose inhaler were discussed and demonstrated to the patient using teach back method using empty device  F/u with pfts w/a

## 2022-12-02 NOTE — Assessment & Plan Note (Signed)
Onset ?  - 09/26/2022   Walked on RA  x  3  lap(s) =  approx 450  ft  @ mod pace, stopped due to end of study with lowest 02 sats 94% and no sob  10/28/2022   Walked on RA  x  3  lap(s) =  approx 450  ft  @ mod pace, stopped due to end of study with lowest 02 sats 91% on initial lap and up to 96% at end with no sob/ limited by R leg pain   - 12/02/2022   Walked on RA  x  3  lap(s) =  approx 450  ft  @ mod pace, stopped due to end of study s sob talking the whole time  with lowest 02 sats 95%     Pfts ordered/ f/u q 3 m sooner prn   Each maintenance medication was reviewed in detail including emphasizing most importantly the difference between maintenance and prns and under what circumstances the prns are to be triggered using an action plan format where appropriate.  Total time for H and P, chart review, counseling, reviewing hfa device(s) , directly observing portions of ambulatory 02 saturation study/ and generating customized AVS unique to this office visit / same day charting > 40 min for multiple  refractory respiratory  symptoms of uncertain etiology

## 2022-12-07 ENCOUNTER — Other Ambulatory Visit: Payer: Self-pay | Admitting: Internal Medicine

## 2022-12-07 DIAGNOSIS — R0609 Other forms of dyspnea: Secondary | ICD-10-CM

## 2022-12-30 DIAGNOSIS — R0600 Dyspnea, unspecified: Secondary | ICD-10-CM | POA: Diagnosis not present

## 2022-12-30 DIAGNOSIS — F32A Depression, unspecified: Secondary | ICD-10-CM | POA: Diagnosis not present

## 2022-12-30 DIAGNOSIS — R0602 Shortness of breath: Secondary | ICD-10-CM | POA: Diagnosis not present

## 2022-12-30 DIAGNOSIS — I10 Essential (primary) hypertension: Secondary | ICD-10-CM | POA: Diagnosis not present

## 2022-12-30 DIAGNOSIS — E7849 Other hyperlipidemia: Secondary | ICD-10-CM | POA: Diagnosis not present

## 2022-12-30 DIAGNOSIS — Z6831 Body mass index (BMI) 31.0-31.9, adult: Secondary | ICD-10-CM | POA: Diagnosis not present

## 2022-12-30 DIAGNOSIS — F331 Major depressive disorder, recurrent, moderate: Secondary | ICD-10-CM | POA: Diagnosis not present

## 2023-01-07 ENCOUNTER — Other Ambulatory Visit: Payer: Self-pay | Admitting: Internal Medicine

## 2023-01-07 DIAGNOSIS — R058 Other specified cough: Secondary | ICD-10-CM

## 2023-01-12 NOTE — Progress Notes (Unsigned)
Barbara [name], female    DOB: 1945/07/28    MRN: [id number]   Brief patient profile:  74  yowf  never smoked but lived in  house of smokers growing up >>>  pattern of recurrent / almost daily cough as long as she can remember  on daily inhaler x ? Since early 2022 much worse cough x May 2024 assoc sob  referred to pulmonary clinic in [facility]  08/29/2022 by Dr Mayford Knife for eval of worsening cough and sob with classic pseudowheeze at initial eval.    History of Present Illness  08/29/2022  Pulmonary/ 1st office eval/ Emelyn Roen / [facility] maint on advair  Chief Complaint  Patient presents with   Establish Care   Shortness of Breath  Dyspnea:  still able shop / cross parking lot/ slower than others/ leaning on cart at grocery store doe Cough: no production / worse immediately on lying down  Sleep: bed is flat/ wedge pillow  helps, otherwise cough much worse hs  SABA use: albuterol doesn't help/ last used one day prior, same with advair  02: none  Rec Stop advair and omeprazole  Depomedrol 120 mg IM   Pantoprazole (protonix) 40 mg   Take  30-60 min before first meal of the day and Pepcid (famotidine)  20 mg after supper until return to office  GERD diet reviewed, bed blocks rec  Take Delsym two tsp every 12 hours and supplement if needed with  oxycodone every 4 hours to suppress the urge to cough.   Once you have eliminated the cough for 3 straight days try reducing the oxycodone  first,  then the delsym as tolerated.    For breathing  > albuterol (Ventolin) 2 puffs up to every 4 hours as needed. Please schedule a follow up office visit in 4 weeks, sooner if needed  with all medications /inhalers/ solutions in hand      - Allergy screen 09/26/22  >  Eos 0.2 /  IgE  57   10/28/2022  f/u ov/[facility]/Joshva Labreck re: cough since childohood  maint on ? not  did not  bring meds and thoroughly confused with names  Chief Complaint  Patient presents with   Follow-up    4 week follow up   Dyspnea:  usual slow steady  Cough: making progress but w/in a few min but doesn't wake her up  and at hs  just a cough or two but then sob x 10 min and resolves spontaneously and able to sleep thru night  Sleeping: bed blocks x 4 in s   resp cc  SABA use: not sure  02: none    Rec Plan A = Automatic = Always=    Flovent 110 Take 2 puffs first thing in am and then another 2 puffs about 12 hours later.  Work on inhaler technique:   Plan B = Backup (to supplement plan A, not to replace it) Only use your albuterol inhaler as a rescue medication  Also  Ok to try albuterol x 2 puffs  15 min before an activity (on alternating days)  that you know would usually make you short of breath  Depomedrol 120 mg IM  Try take CHLORPHENIRAMINE  4 mg  ("Allergy Relief" 4mg  over the counter(ask your pharmacists)     Please schedule a follow up office visit in 4 weeks, sooner if needed  with all  medications /inhalers (ones I have recommended) in hand    12/02/2022  f/u ov/[place]  office/Hezekiah Veltre re: cough entire life  maint on flovent 110  did  bring meds but not inhalers "  I know how to use them"  Chief Complaint  Patient presents with   Upper airway cough syndrome  Dyspnea:  slowed more by back/ lges  Cough: back to baseline dry daytime throat clearlng "all her life" Sleeping: bed blocks s  resp cc  SABA use: twice daily  02: none   Rec Also  Ok to try albuterol 2 puffs x  15 min before an activity (on alternating days)  that you know would usually make you short of breath  My office will be contacting you by phone for referral for PFTs   Work on inhaler technique Please schedule a follow up office visit in 6 weeks, call sooner if needed with all medications /inhalers/ solutions in hand      01/13/2023  f/u ov/[place] [facility]/Siddarth Hsiung ON:GEXBM entire life maint  on flovent 110 one bid  and doing much better/ never had pfts Chief Complaint  Patient presents with   Cough   Shortness of Breath    Dyspnea: sweeping floor makes bed sometimes with albuterol before seems to help Cough: much better  Sleeping: bed blocks one pillow s   resp cc  SABA use: maybe twice weekly feels better if use albuterol before  activity, sometimes not  02: none   No obvious day to day or daytime variability or assoc excess/ purulent sputum or mucus plugs or hemoptysis or cp or chest tightness, subjective wheeze or overt sinus or hb symptoms.    Also denies any obvious fluctuation of symptoms with weather or environmental changes or other aggravating or alleviating factors except as outlined above   No unusual exposure hx or h/o childhood pna/ asthma or knowledge of premature birth.  Current Allergies, Complete Past Medical History, Past Surgical History, Family History, and Social History were reviewed in [employer] record.  ROS  The following are not active complaints unless bolded Hoarseness, sore throat, dysphagia, dental problems, itching, sneezing,  nasal congestion or discharge of excess mucus or purulent secretions, ear ache,   fever, chills, sweats, unintended wt loss or wt gain, classically pleuritic or exertional cp,  orthopnea pnd or arm/hand swelling  or leg swelling, presyncope, palpitations, abdominal pain, anorexia, nausea, vomiting, diarrhea  or change in bowel habits or change in bladder habits, change in stools or change in urine, dysuria, hematuria,  rash, arthralgias, visual complaints, headache, numbness, weakness or ataxia or problems with walking or coordination,  change in mood or  memory.        Current Meds  Medication Sig   albuterol (VENTOLIN HFA) 108 (90 Base) MCG/ACT inhaler INHALE TWO PUFFS EVERY 4 HOURS AS NEEDED ONLY if you can't catch your breath   aspirin EC 81 MG tablet Take 81 mg by mouth daily. Swallow whole.   famotidine (PEPCID) 20 MG tablet One after supper   fluticasone (FLOVENT HFA) 110 MCG/ACT inhaler Inhale 2 puffs into the lungs 2 (two)  times daily.   furosemide (LASIX) 20 MG tablet Take 1 tablet by mouth daily.   loratadine (CLARITIN) 10 MG tablet Take 10 mg by mouth daily.   losartan (COZAAR) 25 MG tablet Take 1 tablet by mouth daily.   MYRBETRIQ 50 MG TB24 tablet Take 50 mg by mouth daily.   pantoprazole (PROTONIX) 40 MG tablet TAKE 1 TABLET BY MOUTH DAILY 30-60 MINUTES BEFORE first meal of THE DAY   polyethylene glycol (  MIRALAX / GLYCOLAX) packet Take 17 g by mouth daily as needed for moderate constipation.   Probiotic Product (DIGESTIVE ADVANTAGE GUMMIES PO) Take 2 each by mouth daily.   senna (SENOKOT) 8.6 MG tablet Take 2 tablets by mouth at bedtime.   Venlafaxine HCl 150 MG TB24 Take 1 tablet by mouth daily.   [DISCONTINUED] fluticasone-salmeterol (ADVAIR) 250-50 MCG/ACT AEPB Inhale 1 puff into the lungs 2 (two) times daily.           Past Medical History:  Diagnosis Date   Anxiety    Asthma    Bladder spasms    DDD (degenerative disc disease)    GERD (gastroesophageal reflux disease)    Hypercholesteremia    Sciatic nerve disease    Sleep apnea       Objective:    Wts  01/13/2023      176   12/02/2022        180  10/28/2022       185   09/26/22 191 lb (86.6 kg)  08/29/22 197 lb (89.4 kg)  05/10/22 220 lb 0.3 oz (99.8 kg)      Vital signs reviewed  01/13/2023  - Note at rest 02 sats  93% on RA   General appearance:    amb pleasant wf nad all smiles today, still very confused with names of meds/ purpose for her resp rx   HEENT : Oropharynx  nl      Nasal turbinates nl    NECK :  without  apparent JVD/ palpable Nodes/TM    LUNGS: no acc muscle use,  Nl contour chest which is clear to A and P bilaterally without cough on insp or exp maneuvers   CV:  RRR  no s3 or murmur or increase in P2, and no edema   ABD:  soft and nontender   MS:  Gait nl   ext warm without deformities Or obvious joint restrictions  calf tenderness, cyanosis or clubbing    SKIN: warm and dry without lesions     NEURO:  alert, approp, nl sensorium with  no motor or cerebellar deficits apparent.        I personally reviewed images and agree with radiology impression as follows:   Chest CT 07/16/22    w contrast Central airways are patent. Bilateral mosaic  attenuation. Elevation of the right hemidiaphragm and  right-greater-than-left atelectasis.         Assessment

## 2023-01-13 ENCOUNTER — Encounter: Payer: Self-pay | Admitting: Internal Medicine

## 2023-01-13 ENCOUNTER — Ambulatory Visit: Payer: Medicare PPO | Admitting: Internal Medicine

## 2023-01-13 VITALS — BP 111/69 | HR 91 | Ht 60.0 in | Wt 176.0 lb

## 2023-01-13 DIAGNOSIS — R058 Other specified cough: Secondary | ICD-10-CM

## 2023-01-13 DIAGNOSIS — R0609 Other forms of dyspnea: Secondary | ICD-10-CM

## 2023-01-13 NOTE — Patient Instructions (Addendum)
Flovent 110 one puff twice daily automatically (like filling tank with high octane fuel)   Work on inhaler technique:  relax and gently blow all the way out then take a nice smooth full deep breath back in, triggering the inhaler at same time you start breathing in.  Hold breath in for at least  5 seconds if you can. Blow out flovent  thru nose. Rinse and gargle with water when done.  If mouth or throat bother you at all,  try brushing teeth/gums/tongue with arm and hammer toothpaste/ make a slurry and gargle and spit out.   Only use your albuterol as a rescue medication to be used if you can't catch your breath by resting or doing a relaxed purse lip breathing pattern.  - The less you use it, the better it will work when you need it. - Ok to use up to 2 puffs  every 4 hours if you must but call for immediate appointment if use goes up over your usual need - Don't leave home without it !!  (think of it like starter fluid)   Also  Ok to try albuterol 15 min before an activity (on alternating days)  that you know would usually make you short of breath and see if it makes any difference and if makes none then don't take albuterol after activity unless you can't catch your breath as this means it's the resting that helps, not the albuterol.      Please schedule a follow up visit in 3 months but call sooner if needed  with all medications /inhalers/ solutions in hand so we can verify exactly what you are taking. This includes all medications from all doctors and over the counters

## 2023-01-14 NOTE — Assessment & Plan Note (Addendum)
Onset as child and never resolved  - 08/29/2022 try off advair and on max gerd rx - 09/26/2022  After extensive coaching inhaler device,  effectiveness =    75% from a baseline of 25% > resume flovent 110 p depomedrol 120 mg IM  - 09/26/2022 added hs 1st gen H1 blockers per guidelines  for noct coughing - Allergy screen 09/26/22  >  Eos 0.2 /  IgE  57 - 01/13/2023  After extensive coaching inhaler device,  effectiveness =    75% with hfa > continue flovent 110 hfa on one bid and approp saba   Cough much better on less medication typical of UACS as higher doses of ics or dpi's make it worse.   No change rx for now / f/u in  3 m with all meds in hand using a trust but verify approach to confirm accurate Medication  Reconciliation The principal here is that until we are certain that the  patients are doing what we've asked, it makes no sense to ask them to do more.           Each maintenance medication was reviewed in detail including emphasizing most importantly the difference between maintenance and prns and under what circumstances the prns are to be triggered using an action plan format where appropriate.  Total time for H and P, chart review, counseling, reviewing hfa device(s) and generating customized AVS unique to this office visit / same day charting = 25 min

## 2023-02-02 ENCOUNTER — Ambulatory Visit: Payer: Self-pay | Admitting: Nurse Practitioner

## 2023-02-02 DIAGNOSIS — E213 Hyperparathyroidism, unspecified: Secondary | ICD-10-CM

## 2023-02-09 ENCOUNTER — Encounter: Payer: Self-pay | Admitting: Nurse Practitioner

## 2023-02-09 ENCOUNTER — Ambulatory Visit: Payer: Medicare PPO | Admitting: Nurse Practitioner

## 2023-02-09 NOTE — Progress Notes (Signed)
 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   Past Medical History:  Diagnosis Date   Anxiety    Asthma    Bladder spasms    DDD (degenerative disc disease)    GERD (gastroesophageal reflux disease)    Hypercholesteremia    Sciatic nerve disease    Sleep apnea     Past Surgical History:  Procedure Laterality Date   ABDOMINAL HYSTERECTOMY     BREAST BIOPSY     precancer-left   CATARACT EXTRACTION W/PHACO Left 08/04/2016   Procedure: CATARACT EXTRACTION PHACO AND INTRAOCULAR LENS PLACEMENT (IOC);  Surgeon: [name], MD;  Location: [facility];  Service: Ophthalmology;  Laterality: Left;  CDE: 5.48   CATARACT EXTRACTION W/PHACO Right 08/18/2016   Procedure: CATARACT EXTRACTION PHACO AND INTRAOCULAR LENS PLACEMENT RIGHT EYE;  Surgeon: [name], MD;  Location: [facility];  Service: Ophthalmology;  Laterality: Right;  CDE: 8.30    COLONOSCOPY     12 years ago, Dr. Shaaron, polyp   complete hysterectomy  2000   fibroids   TONSILLECTOMY     TUBAL LIGATION      Social History   Tobacco Use   Smoking status: Never   Smokeless tobacco: Never  Vaping Use   Vaping status: Never Used  Substance Use Topics   Alcohol use: No   Drug use: No    Family History  Problem Relation Age of Onset   Colon cancer Maternal Grandmother    Breast cancer Mother    Breast cancer Maternal Aunt    Heart disease Other        father, brother   Lung cancer Maternal Grandfather    Inflammatory bowel disease Son        ?crohn's, followed at [facility] on Remicade   GI problems Brother        Bowel perforation   Ovarian cancer Neg Hx    Anesthesia problems Neg Hx    Hypotension Neg Hx    Malignant hyperthermia Neg Hx    Pseudochol deficiency Neg Hx     Outpatient Encounter  Medications as of 02/09/2023  Medication Sig   aspirin EC 81 MG tablet Take 81 mg by mouth daily. Swallow whole.   atorvastatin (LIPITOR) 40 MG tablet Take 40 mg by mouth every other day.   celecoxib (CELEBREX) 200 MG capsule Take 200 mg by mouth every other day.   famotidine  (PEPCID ) 20 MG tablet One after supper   fluticasone (FLOVENT HFA) 110 MCG/ACT inhaler Inhale 2 puffs into the lungs 2 (two) times daily.   furosemide (LASIX) 20 MG tablet Take 1 tablet by mouth daily.   loratadine (CLARITIN) 10 MG tablet Take 10 mg by mouth daily.   losartan (COZAAR) 25 MG tablet Take 1 tablet by mouth daily.   MYRBETRIQ 50 MG TB24 tablet Take 50 mg by mouth daily.   pantoprazole  (PROTONIX ) 40 MG tablet TAKE 1 TABLET BY MOUTH DAILY 30-60 MINUTES BEFORE first meal of THE DAY  polyethylene glycol (MIRALAX  / GLYCOLAX ) packet Take 17 g by mouth daily as needed for moderate constipation.   Probiotic Product (DIGESTIVE ADVANTAGE GUMMIES PO) Take 2 each by mouth daily.   Venlafaxine HCl 150 MG TB24 Take 1 tablet by mouth daily.   carvedilol (COREG) 6.25 MG tablet Take 6.25 mg by mouth 2 (two) times daily. (Patient not taking: Reported on 02/09/2023)   [DISCONTINUED] albuterol  (VENTOLIN  HFA) 108 (90 Base) MCG/ACT inhaler INHALE TWO PUFFS EVERY 4 HOURS AS NEEDED ONLY if you can't catch your breath   [DISCONTINUED] senna (SENOKOT) 8.6 MG tablet Take 2 tablets by mouth at bedtime.   No facility-administered encounter medications on file as of 02/09/2023.    Allergies  Allergen Reactions   Bee Venom Swelling   Codeine     Chest pain   Penicillins Rash     HPI  SUI KASPAREK was diagnosed with hypercalcemia with first elevation noted in chart review to be in 2013.  Patient has no previously known history of parathyroid , pituitary, adrenal dysfunctions; no family history of such dysfunctions. -Review of herreferral package of most recent labs reveals calcium of 10.5 the corresponding PTH of 70 on  10/20/22.  Patient has never had DEXA scan that she knows of.  No prior history of fragility fractures but recently did experience a fall back in September where she broke her wrist requiring surgery to fix. She DOES have history of kidney stones.  No history of CKD. Last BUN/Cr: 19/0.68 and GFR of 90.8  she is not on HCTZ or other thiazide therapy.  She does have history of vitamin D  deficiency. Last vitamin D  level was 25.9 on 03/30/20.  she is not on calcium supplements,  she eats dairy and green, leafy, vegetables on average amounts.  she does not have a family history of hypercalcemia, pituitary tumors, thyroid  cancer, or osteoporosis.   I reviewed her chart and she also has a history of HTN, asthma (following pulmonology), GERD, HLD, constipation, mild MI (follows cardiology).    ROS:  Constitutional: + weight gain, + fatigue, no subjective hyperthermia, no subjective hypothermia Eyes: no blurry vision, no xerophthalmia ENT: no sore throat, no nodules palpated in throat, no dysphagia/odynophagia, + intermittent hoarseness Cardiovascular: no Chest Pain, no palpitations, no leg swelling Respiratory: no cough, no shortness of breath, + intermittent difficulty breathing and SOB (r/t asthma) Gastrointestinal: no Nausea/Vomiting/Diarrhea, + constipation Musculoskeletal: + diffuse muscle/joint aches Skin: no rashes, + dry skin, + brittle nails Neurological: no tremors, no numbness, no tingling, no dizziness Psychiatric: + depression, no anxiety, + brain fog     OBJECTIVE:  BP 134/79 (BP Location: Left Arm, Patient Position: Sitting, Cuff Size: Large)   Pulse 86   Ht 5' (1.524 m)   Wt 182 lb (82.6 kg)   BMI 35.54 kg/m , Body mass index is 35.54 kg/m. Wt Readings from Last 3 Encounters:  02/09/23 182 lb (82.6 kg)  01/13/23 176 lb (79.8 kg)  12/02/22 180 lb (81.6 kg)    BP Readings from Last 3 Encounters:  02/09/23 134/79  01/13/23 111/69  12/02/22 131/79      Physical  Exam- Limited  Constitutional:  Body mass index is 35.54 kg/m. , not in acute distress, normal state of mind Eyes:  EOMI, no exophthalmos Neck: Supple Thyroid : No gross goiter Cardiovascular: RRR, no murmurs, rubs, or gallops, no edema Respiratory: Adequate breathing efforts, no crackles, rales, rhonchi, or wheezing Musculoskeletal: no gross deformities, strength intact in all four extremities, no gross  restriction of joint movements Skin:  no rashes, no hyperemia Neurological: no tremor with outstretched hands    CMP ( most recent) CMP     Component Value Date/Time   NA 141 08/03/2018 1112   K 3.1 (L) 08/03/2018 1112   CL 104 08/03/2018 1112   CO2 22 08/03/2018 1112   GLUCOSE 148 (H) 08/03/2018 1112   BUN 17 08/03/2018 1112   CREATININE 0.70 08/03/2018 1112   CALCIUM 10.3 08/03/2018 1112   GFRNONAA >60 08/03/2018 1112     Diabetic Labs (most recent): No results found for: HGBA1C, MICROALBUR   Lipid Panel ( most recent) Lipid Panel  No results found for: CHOL, TRIG, HDL, CHOLHDL, VLDL, LDLCALC, LDLDIRECT, LABVLDL    Lab Results  Component Value Date   TSH 0.952 09/26/2022      Assessment/Plan: 1. Hypercalcemia / Hyperparathyroidism  Patient has had several instances of elevated calcium, with the highest level being at 10.8 mg/dL. A corresponding intact PTH level was also high, at 70.  - Patient also has vitamin D  deficiency, with the last level being 25.9 but she is not on any replacement therapy.  -She does have mild complications from hypercalcemia/hyperparathyroidism with her history of kidney stones.  She denies osteoporosis, fragility fractures, abdominal pain, no major mood disorders, no bone pain.  - I discussed with the patient about the physiology of calcium and parathyroid  hormone, and possible  effects of  increased PTH/ Calcium , including kidney stones, cardiac dysrhythmias, osteoporosis, abdominal pain, etc.   - The work up so  far is not sufficient to reach a conclusion for definitive therapy.  she needs more studies to confirm and classify the parathyroid  dysfunction she may have. I will proceed to obtain repeat intact PTH/calcium, serum magnesium, serum phosphorus.  It is also essential to obtain 24-hour urine calcium/creatinine to rule out the rare but important cause of mild elevation in calcium and PTH- FHH ( Familial Hypocalciuric Hypercalcemia), which may not require any active intervention.  - I will request for her next DEXA scan to include the distal  33% of radius for evaluation of cortical bone, which is predominantly affected by hyperparathyroidism.   I ordered this to be done soon.  I will also check her for thyroid  dysfunction as well give her recent symptoms.  I do not feel that her parathyroid  issues is the main cause of her difficulty breathing or swallowing but it may be worth performing a thyroid  ultrasound in the future to definitively rule that out.  Will wait on preliminary labs to result before ordering this test.    - Time spent with the patient: 60 minutes, of which >50% was spent in obtaining information about her symptoms, reviewing her previous labs, evaluations, and treatments, counseling her about her  hypercalcemia , and developing a plan to confirm the diagnosis and long term treatment as necessary.  Please refer to  Patient Self Inventory in the Media  tab for reviewed elements of pertinent patient history.  Zebedee JONETTA Monte participated in the discussions, expressed understanding, and voiced agreement with the above plans.  All questions were answered to her satisfaction. she is encouraged to contact clinic should she have any questions or concerns prior to her return visit.   Follow Up Plan: - Return will call with lab results and next steps.SABRA Benton Rio, FNP-BC [facility] [address] [place], [place] [address] Phone: [phone] Fax:  [phone]  02/09/2023, 2:57 PM

## 2023-02-09 NOTE — Patient Instructions (Signed)
 Hypercalcemia Hypercalcemia is when the level of calcium  in a person's blood is above normal. The body needs calcium  to make bones and keep them strong. Calcium  also helps the muscles, nerves, brain, and heart work the way they should. Most of the calcium  in the body is stored in the bones. There is also calcium  in the blood. Hypercalcemia occurs when there is too much calcium  in your blood. Calcium  levels in the blood are regulated by hormones, kidneys, and the gastrointestinal tract.  Hypercalcemia can happen when calcium  comes out of the bones, or when the kidneys are not able to remove calcium  from the blood. Hypercalcemia can be mild or severe. What are the causes? There are many possible causes of hypercalcemia. Common causes of this condition include: Hyperparathyroidism. This is a condition in which the body produces too much parathyroid hormone. There are four parathyroid glands in your neck. These glands produce a chemical messenger (hormone) that helps the body absorb calcium  from foods and helps your bones release calcium . Certain kinds of cancer. Less common causes of hypercalcemia include: Calcium  and vitamin D  dietary supplements. Chronic kidney disease. Hyperthyroidism. Severe dehydration. Being on bed rest or being inactive for a long time. Certain medicines. Infections. What increases the risk? You are more likely to develop this condition if: You are female. You are 76 years of age or older. You have a family history of hypercalcemia. What are the signs or symptoms? Mild hypercalcemia that starts slowly may not cause symptoms. Severe, sudden hypercalcemia is more likely to cause symptoms, such as: Being more thirsty than usual. Needing to urinate more often than usual. Abdominal pain. Nausea and vomiting. Constipation. Muscle pain, twitching, or weakness. Feeling very tired. How is this diagnosed?  Hypercalcemia is usually diagnosed with a blood test. You may also  have tests to help check what is causing this condition. Tests include imaging tests and more blood tests. How is this treated? Treatment for hypercalcemia depends on the cause. Treatment may include: Receiving fluids through an IV. Medicines. These can be used to: Keep calcium  levels steady after receiving fluids (loop diuretics). Keep calcium  in your bones (bisphosphonates). Lower the calcium  level in your blood. Surgery to remove overactive parathyroid glands. A procedure that filters your blood to correct calcium  levels (hemodialysis). Follow these instructions at home:  Take over-the-counter and prescription medicines only as told by your health care provider. Follow instructions from your health care provider about eating or drinking restrictions. Drink enough fluid to keep your urine pale yellow. Stay active. Weight-bearing exercise helps to keep calcium  in your bones. Follow instructions from your health care provider about what type and level of exercise is safe for you. Keep all follow-up visits. This is important. Contact a health care provider if: You have a fever. Your heartbeat is irregular or very fast. You have changes in mood, memory, or personality. Get help right away if: You have severe abdominal pain. You have chest pain. You have trouble breathing. You become very confused and sleepy. You lose consciousness. These symptoms may represent a serious problem that is an emergency. Do not wait to see if the symptoms will go away. Get medical help right away. Call your local emergency services (911 in the [place] Do not drive yourself to the hospital. Summary Hypercalcemia is when the level of calcium  in a person's blood is above normal. The body needs calcium  to make bones and keep them strong. There are many possible causes of hypercalcemia, and treatment depends on  the cause. Take over-the-counter and prescription medicines only as told by your health care  provider. This information is not intended to replace advice given to you by your health care provider. Make sure you discuss any questions you have with your health care provider. Document Revised: 06/20/2020 Document Reviewed: 06/20/2020 Elsevier Patient Education  2024 [employer].

## 2023-02-11 ENCOUNTER — Other Ambulatory Visit: Payer: Self-pay | Admitting: Nurse Practitioner

## 2023-02-11 ENCOUNTER — Encounter: Payer: Self-pay | Admitting: Nurse Practitioner

## 2023-02-11 ENCOUNTER — Telehealth: Payer: Self-pay | Admitting: Nurse Practitioner

## 2023-02-11 DIAGNOSIS — R946 Abnormal results of thyroid function studies: Secondary | ICD-10-CM

## 2023-02-11 DIAGNOSIS — E119 Type 2 diabetes mellitus without complications: Secondary | ICD-10-CM | POA: Diagnosis not present

## 2023-02-11 DIAGNOSIS — E7849 Other hyperlipidemia: Secondary | ICD-10-CM | POA: Diagnosis not present

## 2023-02-11 DIAGNOSIS — E782 Mixed hyperlipidemia: Secondary | ICD-10-CM | POA: Diagnosis not present

## 2023-02-11 DIAGNOSIS — E039 Hypothyroidism, unspecified: Secondary | ICD-10-CM | POA: Diagnosis not present

## 2023-02-11 DIAGNOSIS — I10 Essential (primary) hypertension: Secondary | ICD-10-CM | POA: Diagnosis not present

## 2023-02-11 LAB — COMPREHENSIVE METABOLIC PANEL
ALT: 15 [IU]/L (ref 0–32)
AST: 14 [IU]/L (ref 0–40)
Albumin: 4.3 g/dL (ref 3.8–4.8)
Alkaline Phosphatase: 159 [IU]/L — ABNORMAL HIGH (ref 44–121)
BUN/Creatinine Ratio: 25 (ref 12–28)
BUN: 16 mg/dL (ref 8–27)
Bilirubin Total: 0.4 mg/dL (ref 0.0–1.2)
CO2: 26 mmol/L (ref 20–29)
Calcium: 10.7 mg/dL — ABNORMAL HIGH (ref 8.7–10.3)
Chloride: 104 mmol/L (ref 96–106)
Creatinine, Ser: 0.65 mg/dL (ref 0.57–1.00)
Globulin, Total: 2.4 g/dL (ref 1.5–4.5)
Glucose: 96 mg/dL (ref 70–99)
Potassium: 5.1 mmol/L (ref 3.5–5.2)
Sodium: 143 mmol/L (ref 134–144)
Total Protein: 6.7 g/dL (ref 6.0–8.5)
eGFR: 92 mL/min/{1.73_m2} (ref >59)

## 2023-02-11 LAB — VITAMIN D 25 HYDROXY (VIT D DEFICIENCY, FRACTURES): Vit D, 25-Hydroxy: 22.5 ng/mL — ABNORMAL LOW (ref 30.0–100.0)

## 2023-02-11 LAB — THYROID PEROXIDASE ANTIBODY: Thyroperoxidase Ab SerPl-aCnc: <9 [IU]/mL (ref 0–34)

## 2023-02-11 LAB — PTH, INTACT AND CALCIUM: PTH: 49 pg/mL (ref 15–65)

## 2023-02-11 LAB — MAGNESIUM: Magnesium: 2.1 mg/dL (ref 1.6–2.3)

## 2023-02-11 LAB — PHOSPHORUS: Phosphorus: 3.4 mg/dL (ref 3.0–4.3)

## 2023-02-11 LAB — T3, FREE: T3, Free: 2.5 pg/mL (ref 2.0–4.4)

## 2023-02-11 LAB — T4, FREE: Free T4: 0.71 ng/dL — ABNORMAL LOW (ref 0.82–1.77)

## 2023-02-11 LAB — THYROGLOBULIN ANTIBODY

## 2023-02-11 LAB — TSH: TSH: 1.56 u[IU]/mL (ref 0.450–4.500)

## 2023-02-11 MED ORDER — VITAMIN D (ERGOCALCIFEROL) 1.25 MG (50000 UNIT) PO CAPS: 50000.0000 [IU] | ORAL_CAPSULE | ORAL | 0 refills | Status: DC

## 2023-02-11 NOTE — Progress Notes (Signed)
 Noted, [facility] has sent the patient a message in MyChart about her results.

## 2023-02-11 NOTE — Telephone Encounter (Signed)
 done

## 2023-02-11 NOTE — Telephone Encounter (Signed)
 Pt wants you to send an order in for thyroid  U/S

## 2023-02-11 NOTE — Progress Notes (Signed)
 FYI: I sent mychart message going over recent lab results.

## 2023-02-12 LAB — CALCIUM, URINE, 24 HOUR
Calcium, 24H Urine: 315 mg/(24.h) (ref 0–320)
Calcium, Urine: 21 mg/dL

## 2023-02-12 LAB — CREATININE, URINE, 24 HOUR
Creatinine, 24H Ur: 1122 mg/(24.h) (ref 800–1800)
Creatinine, Urine: 74.8 mg/dL

## 2023-02-18 ENCOUNTER — Ambulatory Visit (HOSPITAL_COMMUNITY)
Admission: RE | Admit: 2023-02-18 | Discharge: 2023-02-18 | Disposition: A | Discharge status: Home / Self Care | Payer: Medicare PPO | Source: Ambulatory Visit | Attending: Nurse Practitioner | Admitting: Nurse Practitioner

## 2023-02-18 DIAGNOSIS — E041 Nontoxic single thyroid nodule: Secondary | ICD-10-CM | POA: Diagnosis not present

## 2023-02-18 DIAGNOSIS — R946 Abnormal results of thyroid function studies: Secondary | ICD-10-CM | POA: Insufficient documentation

## 2023-02-19 ENCOUNTER — Encounter: Payer: Self-pay | Admitting: "Endocrinology

## 2023-02-23 ENCOUNTER — Encounter: Payer: Self-pay | Admitting: Nurse Practitioner

## 2023-02-23 ENCOUNTER — Other Ambulatory Visit: Payer: Self-pay | Admitting: Nurse Practitioner

## 2023-02-23 DIAGNOSIS — R946 Abnormal results of thyroid function studies: Secondary | ICD-10-CM

## 2023-02-23 NOTE — Progress Notes (Signed)
Can we schedule patient for follow up in 3 months with previsit labs?  I just sent a mychart message with instructions to her.

## 2023-02-23 NOTE — Telephone Encounter (Signed)
done

## 2023-03-02 DIAGNOSIS — I10 Essential (primary) hypertension: Secondary | ICD-10-CM | POA: Diagnosis not present

## 2023-03-02 DIAGNOSIS — Z6831 Body mass index (BMI) 31.0-31.9, adult: Secondary | ICD-10-CM | POA: Diagnosis not present

## 2023-03-02 DIAGNOSIS — E213 Hyperparathyroidism, unspecified: Secondary | ICD-10-CM | POA: Diagnosis not present

## 2023-03-02 DIAGNOSIS — E875 Hyperkalemia: Secondary | ICD-10-CM | POA: Diagnosis not present

## 2023-03-20 DIAGNOSIS — R0609 Other forms of dyspnea: Secondary | ICD-10-CM | POA: Diagnosis not present

## 2023-03-20 DIAGNOSIS — R079 Chest pain, unspecified: Secondary | ICD-10-CM | POA: Diagnosis not present

## 2023-03-20 DIAGNOSIS — E782 Mixed hyperlipidemia: Secondary | ICD-10-CM | POA: Diagnosis not present

## 2023-03-20 DIAGNOSIS — G4730 Sleep apnea, unspecified: Secondary | ICD-10-CM | POA: Diagnosis not present

## 2023-03-20 DIAGNOSIS — I10 Essential (primary) hypertension: Secondary | ICD-10-CM | POA: Diagnosis not present

## 2023-04-18 NOTE — Progress Notes (Unsigned)
 Barbara [name], female    DOB: [birth year]    MRN: [id number]   Brief patient profile:  64  yowf  never smoked but lived in  house of smokers growing up >>>  pattern of recurrent / almost daily cough as long as she can remember  on daily inhaler x ? Since early 2022 much worse cough x May 2024 assoc sob  referred to pulmonary clinic in [facility]  08/29/2022 by Dr Mayford Knife for eval of worsening cough and sob with classic pseudowheeze at initial eval.    History of Present Illness  08/29/2022  Pulmonary/ 1st office eval/ Barbara [name] / [place] [facility] maint on advair  Chief Complaint  Patient presents with   Establish Care   Shortness of Breath  Dyspnea:  still able shop / cross parking lot/ slower than others/ leaning on cart at grocery store doe Cough: no production / worse immediately on lying down  Sleep: bed is flat/ wedge pillow  helps, otherwise cough much worse hs  SABA use: albuterol doesn't help/ last used one day prior, same with advair  02: none  Rec Stop advair and omeprazole  Depomedrol 120 mg IM   Pantoprazole (protonix) 40 mg   Take  30-60 min before first meal of the day and Pepcid (famotidine)  20 mg after supper until return to office  GERD diet reviewed, bed blocks rec  Take Delsym two tsp every 12 hours and supplement if needed with  oxycodone every 4 hours to suppress the urge to cough.   Once you have eliminated the cough for 3 straight days try reducing the oxycodone  first,  then the delsym as tolerated.    For breathing  > albuterol (Ventolin) 2 puffs up to every 4 hours as needed. Please schedule a follow up office visit in 4 weeks, sooner if needed  with all medications /inhalers/ solutions in hand      - Allergy screen 09/26/22  >  Eos 0.2 /  IgE  57   10/28/2022  f/u ov/[place] office/Mesiah Manzo re: cough since childohood  maint on ? not  did not  bring meds and thoroughly confused with names  Chief Complaint  Patient presents with   Follow-up    4 week follow up   Dyspnea:  usual slow steady  Cough: making progress but w/in a few min but doesn't wake her up  and at hs  just a cough or two but then sob x 10 min and resolves spontaneously and able to sleep thru night  Sleeping: bed blocks x 4 in s   resp cc  SABA use: not sure  02: none    Rec Plan A = Automatic = Always=    Flovent 110 Take 2 puffs first thing in am and then another 2 puffs about 12 hours later.  Work on inhaler technique:   Plan B = Backup (to supplement plan A, not to replace it) Only use your albuterol inhaler as a rescue medication  Also  Ok to try albuterol x 2 puffs  15 min before an activity (on alternating days)  that you know would usually make you short of breath  Depomedrol 120 mg IM  Try take CHLORPHENIRAMINE  4 mg  ("Allergy Relief" 4mg  over the counter(ask your pharmacists)     Please schedule a follow up office visit in 4 weeks, sooner if needed  with all  medications /inhalers (ones I have recommended) in hand    12/02/2022  f/u ov/[place]  office/Barbara [name] re: cough entire life  maint on flovent 110  did  bring meds but not inhalers "  I know how to use them"  Chief Complaint  Patient presents with   Upper airway cough syndrome  Dyspnea:  slowed more by back/ lges  Cough: back to baseline dry daytime throat clearlng "all her life" Sleeping: bed blocks s  resp cc  SABA use: twice daily  02: none   Rec Also  Ok to try albuterol 2 puffs x  15 min before an activity (on alternating days)  that you know would usually make you short of breath  My office will be contacting you by phone for referral for PFTs   Work on inhaler technique Please schedule a follow up office visit in 6 weeks, call sooner if needed with all medications /inhalers/ solutions in hand      01/13/2023  f/u ov/[place] office/Barbara [name] ZO:XWRUE entire life maint  on flovent 110 one bid  and doing much better/ never had pfts Chief Complaint  Patient presents with   Cough   Shortness of Breath    Dyspnea: sweeping floor, makes bed sometimes with albuterol before seems to help Cough: much better  Sleeping: bed blocks one pillow s   resp cc  SABA use: maybe twice weekly feels better if use albuterol before  activity, sometimes not  02: none  Rec Work on inhaler technique:   Only use your albuterol as a rescue medication  Also  Ok to try albuterol 15 min before an activity (on alternating days)  that you know would usually make you short of breath          04/21/2023  f/u ov/ office/Barbara [name] re: "cough entire life" maint on fluticasone 110 2 qam   did  bring meds  Chief Complaint  Patient presents with   Follow-up    Pt stated that her breathing has improved since her last ov , following up for coughing she stated that he cough is better   Dyspnea:  slowed down L sciatic nerve  Cough: hacking / dry sporadic  Sleeping: bed blocks/ on pillow s    resp cc  SABA use: rarely finds she needs it  02: none   Lung cancer screening: none    No obvious day to day or daytime variability or assoc excess/ purulent sputum or mucus plugs or hemoptysis or cp or chest tightness, subjective wheeze or overt sinus or hb symptoms.    Also denies any obvious fluctuation of symptoms with weather or environmental changes or other aggravating or alleviating factors except as outlined above   No unusual exposure hx or h/o childhood pna/ asthma or knowledge of premature birth.  Current Allergies, Complete Past Medical History, Past Surgical History, Family History, and Social History were reviewed in [employer] record.  ROS  The following are not active complaints unless bolded Hoarseness, sore throat, dysphagia, dental problems, itching, sneezing,  nasal congestion or discharge of excess mucus or purulent secretions, ear ache,   fever, chills, sweats, unintended wt loss or wt gain, classically pleuritic or exertional cp,  orthopnea pnd or arm/hand swelling  or leg swelling,  presyncope, palpitations, abdominal pain, anorexia, nausea, vomiting, diarrhea  or change in bowel habits or change in bladder habits, change in stools or change in urine, dysuria, hematuria,  rash, arthralgias, visual complaints, headache, numbness, weakness or ataxia or problems with walking or coordination,  change in mood or  memory.  Current Meds  Medication Sig   aspirin EC 81 MG tablet Take 81 mg by mouth daily. Swallow whole.   atorvastatin (LIPITOR) 40 MG tablet Take 40 mg by mouth every other day.   carvedilol (COREG) 6.25 MG tablet Take 6.25 mg by mouth 2 (two) times daily.   celecoxib (CELEBREX) 200 MG capsule Take 200 mg by mouth every other day.   famotidine (PEPCID) 20 MG tablet One after supper   fluticasone (FLOVENT HFA) 110 MCG/ACT inhaler Inhale 2 puffs into the lungs 2 (two) times daily.   furosemide (LASIX) 20 MG tablet Take 1 tablet by mouth daily.   loratadine (CLARITIN) 10 MG tablet Take 10 mg by mouth daily.   losartan (COZAAR) 25 MG tablet Take 1 tablet by mouth daily.   MYRBETRIQ 50 MG TB24 tablet Take 50 mg by mouth daily.   pantoprazole (PROTONIX) 40 MG tablet TAKE 1 TABLET BY MOUTH DAILY 30-60 MINUTES BEFORE first meal of THE DAY   polyethylene glycol (MIRALAX / GLYCOLAX) packet Take 17 g by mouth daily as needed for moderate constipation.   Probiotic Product (DIGESTIVE ADVANTAGE GUMMIES PO) Take 2 each by mouth daily.   Venlafaxine HCl 150 MG TB24 Take 1 tablet by mouth daily.   Vitamin D, Ergocalciferol, (DRISDOL) 1.25 MG (50000 UNIT) CAPS capsule Take 1 capsule (50,000 Units total) by mouth every 7 (seven) days.            Past Medical History:  Diagnosis Date   Anxiety    Asthma    Bladder spasms    DDD (degenerative disc disease)    GERD (gastroesophageal reflux disease)    Hypercholesteremia    Sciatic nerve disease    Sleep apnea       Objective:    Wts  04/21/2023        182  01/13/2023      176   12/02/2022        180  10/28/2022        185   09/26/22 191 lb (86.6 kg)  08/29/22 197 lb (89.4 kg)  05/10/22 220 lb 0.3 oz (99.8 kg)     Vital signs reviewed  04/21/2023  - Note at rest 02 sats  94% on RA   General appearance:    obese amb wf with occ spont harsh dry coughing    HEENT : Oropharynx  clear      Nasal turbinates nl    NECK :  without  apparent JVD/ palpable Nodes/TM    LUNGS: no acc muscle use,  Nl contour chest which is clear to A and P bilaterally without cough on insp or exp maneuvers   CV:  RRR  no s3 or murmur or increase in P2, and no edema   ABD:  obese soft and nontender   MS:  Gait nl   ext warm without deformities Or obvious joint restrictions  calf tenderness, cyanosis or clubbing    SKIN: warm and dry without lesions    NEURO:  alert, approp, nl sensorium with  no motor or cerebellar deficits apparent.                 Assessment

## 2023-04-20 DIAGNOSIS — Z0001 Encounter for general adult medical examination with abnormal findings: Secondary | ICD-10-CM | POA: Diagnosis not present

## 2023-04-20 DIAGNOSIS — E7849 Other hyperlipidemia: Secondary | ICD-10-CM | POA: Diagnosis not present

## 2023-04-20 DIAGNOSIS — E7801 Familial hypercholesterolemia: Secondary | ICD-10-CM | POA: Diagnosis not present

## 2023-04-20 DIAGNOSIS — Z6831 Body mass index (BMI) 31.0-31.9, adult: Secondary | ICD-10-CM | POA: Diagnosis not present

## 2023-04-20 DIAGNOSIS — I10 Essential (primary) hypertension: Secondary | ICD-10-CM | POA: Diagnosis not present

## 2023-04-20 DIAGNOSIS — E213 Hyperparathyroidism, unspecified: Secondary | ICD-10-CM | POA: Diagnosis not present

## 2023-04-20 DIAGNOSIS — E782 Mixed hyperlipidemia: Secondary | ICD-10-CM | POA: Diagnosis not present

## 2023-04-20 DIAGNOSIS — E875 Hyperkalemia: Secondary | ICD-10-CM | POA: Diagnosis not present

## 2023-04-21 ENCOUNTER — Encounter: Payer: Self-pay | Admitting: Internal Medicine

## 2023-04-21 ENCOUNTER — Ambulatory Visit: Payer: Medicare PPO | Admitting: Internal Medicine

## 2023-04-21 VITALS — BP 116/77 | HR 73 | Ht 60.0 in | Wt 182.2 lb

## 2023-04-21 DIAGNOSIS — R058 Other specified cough: Secondary | ICD-10-CM | POA: Diagnosis not present

## 2023-04-21 DIAGNOSIS — G4733 Obstructive sleep apnea (adult) (pediatric): Secondary | ICD-10-CM

## 2023-04-21 NOTE — Assessment & Plan Note (Signed)
 Onset as child and never resolved  - 08/29/2022 try off advair and on max gerd rx - 09/26/2022  After extensive coaching inhaler device,  effectiveness =    75% from a baseline of 25% > resume flovent 110 p depomedrol 120 mg IM  - 09/26/2022 added hs 1st gen H1 blockers per guidelines  for noct coughing - Allergy screen 09/26/22  >  Eos 0.2 /  IgE  57 - 01/13/2023     75% with hfa > continue flovent 110 hfa on one bid and approp saba  - 04/21/2023  After extensive coaching inhaler device,  effectiveness =    80% continue fovent 110 2 each am   States much improved on ICS and gerd rx though note now on fosamax so need to monitor for breakthru gerd symptoms

## 2023-04-21 NOTE — Assessment & Plan Note (Signed)
 See HST  09/2022 > referred to sleep medicine in [place]  She has minima hypersomnolence but will need trial of cpap and recheck 02 sats while on CPAP to assure noct hypoxemia corrected or cpap titration / 02 needed   Discussed in detail all the  indications, usual  risks and alternatives  relative to the benefits with patient who agrees to proceed with w/u as outlined.            Each maintenance medication was reviewed in detail including emphasizing most importantly the difference between maintenance and prns and under what circumstances the prns are to be triggered using an action plan format where appropriate.  Total time for H and P, chart review, counseling, reviewing hfa  device(s) and generating customized AVS unique to this office visit / same day charting = 31 min

## 2023-04-21 NOTE — Patient Instructions (Signed)
 Return to see one of our sleep NPs next available   Pulmonary follow is as needed

## 2023-05-05 LAB — COLOGUARD

## 2023-05-07 DIAGNOSIS — E039 Hypothyroidism, unspecified: Secondary | ICD-10-CM | POA: Diagnosis not present

## 2023-05-07 DIAGNOSIS — E119 Type 2 diabetes mellitus without complications: Secondary | ICD-10-CM | POA: Diagnosis not present

## 2023-05-07 DIAGNOSIS — E875 Hyperkalemia: Secondary | ICD-10-CM | POA: Diagnosis not present

## 2023-05-19 DIAGNOSIS — R946 Abnormal results of thyroid function studies: Secondary | ICD-10-CM | POA: Diagnosis not present

## 2023-05-22 LAB — COLOGUARD

## 2023-05-25 ENCOUNTER — Encounter: Payer: Self-pay | Admitting: Nurse Practitioner

## 2023-05-25 ENCOUNTER — Ambulatory Visit: Payer: Medicare PPO | Admitting: Nurse Practitioner

## 2023-05-25 VITALS — BP 110/70 | HR 66 | Ht 60.0 in | Wt 181.0 lb

## 2023-05-25 DIAGNOSIS — E559 Vitamin D deficiency, unspecified: Secondary | ICD-10-CM

## 2023-05-25 DIAGNOSIS — R946 Abnormal results of thyroid function studies: Secondary | ICD-10-CM

## 2023-05-25 MED ORDER — VITAMIN D (ERGOCALCIFEROL) 1.25 MG (50000 UNIT) PO CAPS: 50000.0000 [IU] | ORAL_CAPSULE | ORAL | 0 refills | Status: DC

## 2023-05-25 NOTE — Progress Notes (Signed)
 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   Past Medical History:  Diagnosis Date   Anxiety    Asthma    Bladder spasms    DDD (degenerative disc disease)    GERD (gastroesophageal reflux disease)    Hypercholesteremia    Sciatic nerve disease    Sleep apnea     Past Surgical History:  Procedure Laterality Date   ABDOMINAL HYSTERECTOMY     BREAST BIOPSY     "precancer"-left   CATARACT EXTRACTION W/PHACO Left 08/04/2016   Procedure: CATARACT EXTRACTION PHACO AND INTRAOCULAR LENS PLACEMENT (IOC);  Surgeon: Barbara Kill, MD;  Location: [facility];  Service: Ophthalmology;  Laterality: Left;  CDE: 5.48   CATARACT EXTRACTION W/PHACO Right 08/18/2016   Procedure: CATARACT EXTRACTION PHACO AND INTRAOCULAR LENS PLACEMENT RIGHT EYE;  Surgeon: Barbara Kill, MD;  Location: [facility];  Service: Ophthalmology;  Laterality: Right;  CDE: 8.30    COLONOSCOPY     12 years ago, Dr. Riley [name], polyp   complete hysterectomy  2000   fibroids   TONSILLECTOMY     TUBAL LIGATION      Social History   Tobacco Use   Smoking status: Never   Smokeless tobacco: Never  Vaping Use   Vaping status: Never Used  Substance Use Topics   Alcohol use: No   Drug use: No    Family History  Problem Relation Age of Onset   Colon cancer Maternal Grandmother    Breast cancer Mother    Breast cancer Maternal Aunt    Heart disease Other        father, brother   Lung cancer Maternal Grandfather    Inflammatory bowel disease Son        ?crohn's, followed at [facility] on Remicade   GI problems Brother        Bowel perforation   Ovarian cancer Neg Hx    Anesthesia problems Neg Hx    Hypotension Neg Hx    Malignant hyperthermia Neg Hx    Pseudochol deficiency Neg Hx     Outpatient Encounter  Medications as of 05/25/2023  Medication Sig   aspirin EC 81 MG tablet Take 81 mg by mouth daily. Swallow whole.   atorvastatin (LIPITOR) 40 MG tablet Take 40 mg by mouth every other day.   carvedilol (COREG) 6.25 MG tablet Take 6.25 mg by mouth 2 (two) times daily.   celecoxib (CELEBREX) 200 MG capsule Take 200 mg by mouth every other day.   fluticasone (FLOVENT HFA) 110 MCG/ACT inhaler Inhale 2 puffs into the lungs 2 (two) times daily.   furosemide (LASIX) 20 MG tablet Take 1 tablet by mouth daily.   loratadine (CLARITIN) 10 MG tablet Take 10 mg by mouth daily.   losartan (COZAAR) 25 MG tablet Take 1 tablet by mouth daily.   MYRBETRIQ 50 MG TB24 tablet Take 50 mg by mouth daily.   Probiotic Product (DIGESTIVE ADVANTAGE GUMMIES PO) Take 2 each by mouth daily.  Venlafaxine HCl 150 MG TB24 Take 1 tablet by mouth daily.   [DISCONTINUED] Vitamin D , Ergocalciferol , (DRISDOL ) 1.25 MG (50000 UNIT) CAPS capsule Take 1 capsule (50,000 Units total) by mouth every 7 (seven) days.   pantoprazole  (PROTONIX ) 40 MG tablet TAKE 1 TABLET BY MOUTH DAILY 30-60 MINUTES BEFORE first meal of THE DAY (Patient not taking: Reported on 05/25/2023)   polyethylene glycol (MIRALAX  / GLYCOLAX ) packet Take 17 g by mouth daily as needed for moderate constipation. (Patient not taking: Reported on 05/25/2023)   Vitamin D , Ergocalciferol , (DRISDOL ) 1.25 MG (50000 UNIT) CAPS capsule Take 1 capsule (50,000 Units total) by mouth every 7 (seven) days.   [DISCONTINUED] famotidine  (PEPCID ) 20 MG tablet One after supper (Patient not taking: Reported on 05/25/2023)   No facility-administered encounter medications on file as of 05/25/2023.    Allergies  Allergen Reactions   Bee Venom Swelling   Codeine     Chest pain   Penicillins Rash     HPI  Barbara [name] was diagnosed with hypercalcemia with first elevation noted in chart review to be in 2013.  Patient has no previously known history of parathyroid, pituitary, adrenal  dysfunctions; no family history of such dysfunctions. -Review of herreferral package of most recent labs reveals calcium of 10.5 the corresponding PTH of 70 on 10/20/22.  Patient has never had DEXA scan that she knows of.  No prior history of fragility fractures but recently did experience a fall back in September where she broke her wrist requiring surgery to fix. She DOES have history of kidney stones.  No history of CKD. Last BUN/Cr: 19/0.68 and GFR of 90.8  she is not on HCTZ or other thiazide therapy.  She does have history of vitamin D  deficiency. Last vitamin D  level was 25.9 on 03/30/20.  she is not on calcium supplements,  she eats dairy and green, leafy, vegetables on average amounts.  she does not have a family history of hypercalcemia, pituitary tumors, thyroid  cancer, or osteoporosis.   I reviewed her chart and she also has a history of HTN, asthma (following pulmonology), GERD, HLD, constipation, mild MI (follows cardiology).    Review of systems  Constitutional: + Minimally fluctuating body weight,  current Body mass index is 35.35 kg/m. , no fatigue, no subjective hyperthermia, no subjective hypothermia Eyes: no blurry vision, no xerophthalmia ENT: no sore throat, no nodules palpated in throat, no dysphagia/odynophagia, no hoarseness Cardiovascular: no chest pain, no shortness of breath, no palpitations, no leg swelling Respiratory: no cough, no shortness of breath Gastrointestinal: no nausea/vomiting/diarrhea Musculoskeletal: + intermittent diffuse muscle/joint aches Skin: no rashes, no hyperemia Neurological: no tremors, no numbness, no tingling, no dizziness Psychiatric: no depression, no anxiety   --------------------------------------------------------------------------------------------------------------------------  OBJECTIVE:  BP 110/70 (BP Location: Right Arm, Patient Position: Sitting, Cuff Size: Large)   Pulse 66   Ht 5' (1.524 m)   Wt 181 lb (82.1 kg)    BMI 35.35 kg/m , Body mass index is 35.35 kg/m. Wt Readings from Last 3 Encounters:  05/25/23 181 lb (82.1 kg)  04/21/23 182 lb 3.2 oz (82.6 kg)  02/09/23 182 lb (82.6 kg)    BP Readings from Last 3 Encounters:  05/25/23 110/70  04/21/23 116/77  02/09/23 134/79      Physical Exam- Limited  Constitutional:  Body mass index is 35.35 kg/m. , not in acute distress, normal state of mind Eyes:  EOMI, no exophthalmos Musculoskeletal: no gross deformities, strength intact in all four extremities, no gross restriction  of joint movements Skin:  no rashes, no hyperemia Neurological: no tremor with outstretched hands   CMP ( most recent) CMP     Component Value Date/Time   NA 143 05/19/2023 1101   K 4.5 05/19/2023 1101   CL 103 05/19/2023 1101   CO2 22 05/19/2023 1101   GLUCOSE 87 05/19/2023 1101   GLUCOSE 148 (H) 08/03/2018 1112   BUN 16 05/19/2023 1101   CREATININE 0.66 05/19/2023 1101   CALCIUM 10.3 05/19/2023 1101   PROT 6.4 05/19/2023 1101   ALBUMIN 4.3 05/19/2023 1101   AST 16 05/19/2023 1101   ALT 17 05/19/2023 1101   ALKPHOS 132 (H) 05/19/2023 1101   BILITOT 0.4 05/19/2023 1101   EGFR 91 05/19/2023 1101   GFRNONAA >60 08/03/2018 1112     Diabetic Labs (most recent): No results found for: "HGBA1C", "MICROALBUR"   Lipid Panel ( most recent) Lipid Panel  No results found for: "CHOL", "TRIG", "HDL", "CHOLHDL", "VLDL", "LDLCALC", "LDLDIRECT", "LABVLDL"    Lab Results  Component Value Date   TSH 1.350 05/19/2023   TSH 1.560 02/09/2023   TSH 0.952 09/26/2022   FREET4 0.79 (L) 05/19/2023   FREET4 0.71 (L) 02/09/2023     Latest Reference Range & Units 05/19/23 11:01  COMPREHENSIVE METABOLIC PANEL WITH GFR  Rpt !  Sodium 134 - 144 mmol/L 143  Potassium 3.5 - 5.2 mmol/L 4.5  Chloride 96 - 106 mmol/L 103  CO2 20 - 29 mmol/L 22  Glucose 70 - 99 mg/dL 87  BUN 8 - 27 mg/dL 16  Creatinine 1.61 - 0.96 mg/dL 0.45  Calcium 8.7 - 40.9 mg/dL 81.1  BUN/Creatinine  Ratio 12 - 28  24  eGFR >59 mL/min/1.73 91  Alkaline Phosphatase 44 - 121 IU/L 132 (H)  Albumin 3.8 - 4.8 g/dL 4.3  AST 0 - 40 IU/L 16  ALT 0 - 32 IU/L 17  Total Protein 6.0 - 8.5 g/dL 6.4  Total Bilirubin 0.0 - 1.2 mg/dL 0.4  Vitamin D , 25-Hydroxy 30.0 - 100.0 ng/mL 51.4  Globulin, Total 1.5 - 4.5 g/dL 2.1  PTH, Intact 15 - 65 pg/mL 49  PTH-related peptide  WILL FOLLOW (P)  PTH Interp  Comment  TSH 0.450 - 4.500 uIU/mL 1.350  Triiodothyronine,Free,Serum 2.0 - 4.4 pg/mL 2.7  T4,Free(Direct) 0.82 - 1.77 ng/dL 9.14 (L)  !: Data is abnormal (H): Data is abnormally high (L): Data is abnormally low (P): Preliminary Rpt: View report in Results Review for more information   --------------------------------------------------------------------------------------------------------------- Assessment/Plan: 1. Hypercalcemia / Hyperparathyroidism  24 hour urine specimen rules out [facility].   Suspect related to vitamin D  deficiency.  She was started on daily Vitamin D  supplementation, prescription for Ergocalciferol  was sent in but she never started this- I resent this script today.  Her vitamin D  level has improved to 51.4.  Calcium has normalized at 10.3 and PTH remains unchanged at 49.   Her thyroid  function shows slightly low free T4, improving slightly from previous reading.  Perhaps related to acute inflammation?  Will repeat thyroid  function tests prior to next visit as well as vitamin D , and parathyroid hormone for surveillance     I spent  25  minutes in the care of the patient today including review of labs from Thyroid  Function, CMP, and other relevant labs ; imaging/biopsy records (current and previous including abstractions from other facilities); face-to-face time discussing  her lab results and symptoms, medications doses, her options of short and long term treatment based on the latest  standards of care / guidelines;   and documenting the encounter.  Ennis Hart  participated in  the discussions, expressed understanding, and voiced agreement with the above plans.  All questions were answered to her satisfaction. she is encouraged to contact clinic should she have any questions or concerns prior to her return visit.   Follow Up Plan: - Return in about 4 months (around 09/24/2023) for hypercalcemia follow up, Thyroid  follow up, Previsit labs.   Hulon Magic, [facility] [address] [place], [place] [address] Phone: [phone] Fax: [phone]  05/25/2023, 1:56 PM

## 2023-05-26 LAB — VITAMIN D 25 HYDROXY (VIT D DEFICIENCY, FRACTURES): Vit D, 25-Hydroxy: 51.4 ng/mL (ref 30.0–100.0)

## 2023-05-26 LAB — COMPREHENSIVE METABOLIC PANEL WITH GFR
ALT: 17 IU/L (ref 0–32)
AST: 16 IU/L (ref 0–40)
Albumin: 4.3 g/dL (ref 3.8–4.8)
Alkaline Phosphatase: 132 IU/L — ABNORMAL HIGH (ref 44–121)
BUN/Creatinine Ratio: 24 (ref 12–28)
BUN: 16 mg/dL (ref 8–27)
Bilirubin Total: 0.4 mg/dL (ref 0.0–1.2)
CO2: 22 mmol/L (ref 20–29)
Calcium: 10.3 mg/dL (ref 8.7–10.3)
Chloride: 103 mmol/L (ref 96–106)
Creatinine, Ser: 0.66 mg/dL (ref 0.57–1.00)
Globulin, Total: 2.1 g/dL (ref 1.5–4.5)
Glucose: 87 mg/dL (ref 70–99)
Potassium: 4.5 mmol/L (ref 3.5–5.2)
Sodium: 143 mmol/L (ref 134–144)
Total Protein: 6.4 g/dL (ref 6.0–8.5)
eGFR: 91 mL/min/{1.73_m2} (ref >59)

## 2023-05-26 LAB — T3, FREE: T3, Free: 2.7 pg/mL (ref 2.0–4.4)

## 2023-05-26 LAB — T4, FREE: Free T4: 0.79 ng/dL — ABNORMAL LOW (ref 0.82–1.77)

## 2023-05-26 LAB — PTH-RELATED PEPTIDE: PTH-related peptide: <2 pmol/L

## 2023-05-26 LAB — TSH: TSH: 1.35 u[IU]/mL (ref 0.450–4.500)

## 2023-05-26 LAB — PTH, INTACT AND CALCIUM: PTH: 49 pg/mL (ref 15–65)

## 2023-06-04 DIAGNOSIS — Z1211 Encounter for screening for malignant neoplasm of colon: Secondary | ICD-10-CM | POA: Diagnosis not present

## 2023-06-04 DIAGNOSIS — Z1212 Encounter for screening for malignant neoplasm of rectum: Secondary | ICD-10-CM | POA: Diagnosis not present

## 2023-06-12 LAB — COLOGUARD: COLOGUARD: NEGATIVE

## 2023-06-17 DIAGNOSIS — Z6831 Body mass index (BMI) 31.0-31.9, adult: Secondary | ICD-10-CM | POA: Diagnosis not present

## 2023-06-17 DIAGNOSIS — R300 Dysuria: Secondary | ICD-10-CM | POA: Diagnosis not present

## 2023-06-17 DIAGNOSIS — R350 Frequency of micturition: Secondary | ICD-10-CM | POA: Diagnosis not present

## 2023-07-03 NOTE — Progress Notes (Signed)
 Called pt to get information regarding her CPAP LVM - sent [facility] message

## 2023-07-06 ENCOUNTER — Encounter: Payer: Self-pay | Admitting: Primary Care

## 2023-07-06 ENCOUNTER — Ambulatory Visit: Admitting: Primary Care

## 2023-07-06 VITALS — BP 108/68 | HR 94 | Ht 60.0 in | Wt 181.6 lb

## 2023-07-06 DIAGNOSIS — R053 Chronic cough: Secondary | ICD-10-CM | POA: Diagnosis not present

## 2023-07-06 DIAGNOSIS — I252 Old myocardial infarction: Secondary | ICD-10-CM | POA: Diagnosis not present

## 2023-07-06 DIAGNOSIS — G4733 Obstructive sleep apnea (adult) (pediatric): Secondary | ICD-10-CM

## 2023-07-06 DIAGNOSIS — J399 Disease of upper respiratory tract, unspecified: Secondary | ICD-10-CM

## 2023-07-06 NOTE — Telephone Encounter (Signed)
 Pt replied in mychart she is not using cpap machine

## 2023-07-06 NOTE — Patient Instructions (Addendum)
  VISIT SUMMARY: Today, we discussed your obstructive sleep apnea and the significant daytime sleepiness and fatigue you are experiencing. We reviewed the results of your home sleep study and your previous attempt with a CPAP machine. We also talked about your history of a mild heart attack and the importance of managing your sleep apnea to reduce the risk of further heart issues.  YOUR PLAN: -OBSTRUCTIVE SLEEP APNEA: Obstructive sleep apnea is a condition where your airway becomes blocked during sleep, causing breathing pauses and reduced oxygen levels. We recommend retrying CPAP therapy with a nasal mask and chin strap, and considering a CPAP pillow for side sleeping. It's important to use the CPAP machine nightly for at least 4 hours, 70% of the time. If CPAP is not tolerated, we can explore alternative options like oral appliances, positional therapy, or a surgical evaluation by an ENT specialist. Oxygen therapy may be considered if other treatments are not feasible.  INSTRUCTIONS: Please retry the CPAP therapy with the nasal mask and chin strap as discussed. Use the CPAP machine nightly for at least 4 hours, 70% of the time. If you experience any issues or discomfort, contact our office to discuss alternative options. Follow up with us  if you have any new or worsening symptoms.  Orders Auto CPAP 5-15 with heated humidity and mask of choice (please order)  Follow-up  6-12 weeks with Beth for CPAP compliance

## 2023-07-06 NOTE — Progress Notes (Signed)
 @Patient  ID: Ennis Hart, female    DOB: Jan 22, 1946, 78 y.o.   MRN: [id number]  Chief Complaint  Patient presents with   Establish Care    Referring provider: Lauran Pollard, MD  HPI: 78 year old female, never smoked. PMH significant HTN, mild persistent asthma, OSA, GERD, hyperlipidemia, upper airway cough syndrome.   07/06/2023 Discussed the use of AI scribe software for clinical note transcription with the patient, who gave verbal consent to proceed.  History of Present Illness   ZABDI MIS is a 78 year old female with obstructive sleep apnea who presents with daytime sleepiness and fatigue. She was referred by Dr. Broadus Canes for evaluation of her sleep apnea.  She underwent a home sleep study on October 14, 2022, which revealed 14.5 apneic events per hour and oxygen desaturation to 77%. She experiences significant daytime sleepiness and fatigue, with an Epworth Sleepiness Scale score of 15 out of 24. She has a high chance of falling asleep while reading and a moderate chance while watching TV, sitting inactive in public, as a passenger in a car, lying down in the afternoon, sitting and talking to someone, and after eating.  Her sleep is described as restless, with frequent awakenings two to three times per night to use the bathroom. She typically goes to bed by 11 PM but takes at least an hour to fall asleep. Her wake-up time varies, but she tries to get up by 9 AM, although she sometimes stays in bed until noon due to feeling unwell. Her husband reports snoring, but she has never woken up gasping or choking.  She attempted to use a CPAP machine once, provided by [employer], but found it uncomfortable and returned it the next day. She is accustomed to sleeping on her sides and found the CPAP mask incompatible with her sleeping position.  She has a history of back issues, having been advised to undergo major back surgery three years ago, which was deemed not  feasible due to her age. She also reports a past small heart attack, identified during a hospital stay last year, but denies any history of cardiac arrhthymias including atrial fibrillation.      Allergies  Allergen Reactions   Bee Venom Swelling   Codeine     Chest pain   Penicillins Rash    Immunization History  Administered Date(s) Administered   Influenza, High Dose Seasonal PF 10/11/2018   Influenza,inj,quad, With Preservative 12/16/2016, 12/31/2016, 10/21/2017   Pneumococcal Polysaccharide-23 03/04/2017   Pneumococcal-Unspecified 02/27/2017   Zoster Recombinant(Shingrix) 03/26/2017    Past Medical History:  Diagnosis Date   Anxiety    Asthma    Bladder spasms    DDD (degenerative disc disease)    GERD (gastroesophageal reflux disease)    Hypercholesteremia    Sciatic nerve disease    Sleep apnea     Tobacco History: Social History   Tobacco Use  Smoking Status Never  Smokeless Tobacco Never   Counseling given: Not Answered   Outpatient Medications Prior to Visit  Medication Sig Dispense Refill   aspirin EC 81 MG tablet Take 81 mg by mouth daily. Swallow whole.     atorvastatin (LIPITOR) 40 MG tablet Take 40 mg by mouth every other day.     carvedilol (COREG) 6.25 MG tablet Take 6.25 mg by mouth 2 (two) times daily.     celecoxib (CELEBREX) 200 MG capsule Take 200 mg by mouth every other day.     famotidine  (PEPCID ) 20  MG tablet 20 mg at bedtime.     fluticasone (FLOVENT HFA) 110 MCG/ACT inhaler Inhale 2 puffs into the lungs 2 (two) times daily.     furosemide (LASIX) 20 MG tablet Take 1 tablet by mouth daily.     loratadine (CLARITIN) 10 MG tablet Take 10 mg by mouth daily.     losartan (COZAAR) 25 MG tablet Take 1 tablet by mouth daily.     MYRBETRIQ 50 MG TB24 tablet Take 50 mg by mouth daily.     nitrofurantoin, macrocrystal-monohydrate, (MACROBID) 100 MG capsule Take 100 mg by mouth 2 (two) times daily.     pantoprazole  (PROTONIX ) 40 MG tablet TAKE 1  TABLET BY MOUTH DAILY 30-60 MINUTES BEFORE first meal of THE DAY 30 tablet 2   polyethylene glycol (MIRALAX  / GLYCOLAX ) packet Take 17 g by mouth daily as needed for moderate constipation.     Probiotic Product (DIGESTIVE ADVANTAGE GUMMIES PO) Take 2 each by mouth daily.     Venlafaxine HCl 150 MG TB24 Take 1 tablet by mouth daily.     Vitamin D , Ergocalciferol , (DRISDOL ) 1.25 MG (50000 UNIT) CAPS capsule Take 1 capsule (50,000 Units total) by mouth every 7 (seven) days. 12 capsule 0   No facility-administered medications prior to visit.    Review of Systems  Review of Systems  Constitutional:  Positive for fatigue.  HENT: Negative.    Respiratory: Negative.    Psychiatric/Behavioral:  Positive for sleep disturbance.    Physical Exam  BP 108/68 (BP Location: Left Arm)   Pulse 94   Ht 5' (1.524 m)   Wt 181 lb 9.6 oz (82.4 kg)   SpO2 96%   BMI 35.47 kg/m  Physical Exam Constitutional:      Appearance: Normal appearance.  HENT:     Head: Normocephalic and atraumatic.  Cardiovascular:     Rate and Rhythm: Normal rate and regular rhythm.  Pulmonary:     Effort: Pulmonary effort is normal.     Breath sounds: Normal breath sounds.  Musculoskeletal:        General: Normal range of motion.  Skin:    General: Skin is warm and dry.  Neurological:     General: No focal deficit present.     Mental Status: She is alert and oriented to person, place, and time. Mental status is at baseline.  Psychiatric:        Mood and Affect: Mood normal.        Behavior: Behavior normal.        Thought Content: Thought content normal.        Judgment: Judgment normal.      Lab Results:  CBC    Component Value Date/Time   WBC 5.7 09/26/2022 1019   WBC 7.7 08/03/2018 1112   RBC 4.71 09/26/2022 1019   RBC 4.36 08/03/2018 1112   HGB 14.4 09/26/2022 1019   HCT 44.4 09/26/2022 1019   PLT 231 09/26/2022 1019   MCV 94 09/26/2022 1019   MCH 30.6 09/26/2022 1019   MCH 31.7 08/03/2018 1112    MCHC 32.4 09/26/2022 1019   MCHC 32.5 08/03/2018 1112   RDW 12.7 09/26/2022 1019   LYMPHSABS 1.0 09/26/2022 1019   MONOABS 0.5 08/03/2018 1112   EOSABS 0.2 09/26/2022 1019   BASOSABS 0.1 09/26/2022 1019    BMET    Component Value Date/Time   NA 143 05/19/2023 1101   K 4.5 05/19/2023 1101   CL 103 05/19/2023 1101   CO2  22 05/19/2023 1101   GLUCOSE 87 05/19/2023 1101   GLUCOSE 148 (H) 08/03/2018 1112   BUN 16 05/19/2023 1101   CREATININE 0.66 05/19/2023 1101   CALCIUM 10.3 05/19/2023 1101   GFRNONAA >60 08/03/2018 1112   GFRAA >60 08/03/2018 1112    BNP    Component Value Date/Time   BNP 50.2 09/26/2022 1019    ProBNP No results found for: "PROBNP"  Imaging: No results found.   Assessment & Plan:   1. OSA (obstructive sleep apnea) (Primary) - Ambulatory Referral for DME  Assessment and Plan    Obstructive Sleep Apnea Mild to moderate obstructive sleep apnea diagnosed via home sleep study in September 2024, with 14.5 apneic events per hour and oxygen desaturation to 77%. Significant daytime sleepiness and fatigue, with an Epworth score of 15/24. Previous unsuccessful attempt with CPAP due to discomfort. Discussed various CPAP mask options and the potential use of a CPAP pillow for side sleepers. Emphasized the importance of CPAP compliance to reduce risks of cardiac arrhythmias, stroke, pulmonary hypertension, and diabetes. Alternative options include oral appliances and surgical evaluation by ENT if CPAP is not tolerated. Discussed the potential need for oxygen therapy if CPAP is not tolerated and oral appliances are not feasible. - Retry CPAP with a nasal mask and chin strap. - Consider CPAP pillow for side sleepers. - Educate on CPAP compliance: use nightly for at least 4 hours, 70% of the time. - Discuss alternative options if CPAP is not tolerated, including oral appliances and ENT referral for surgical evaluation. - Consider positional therapy with wedge or  MedCline pillow if CPAP is not tolerated. - If CPAP is not tolerated, consider lab study for oxygen therapy. - FU 31-90 days for CPAP compliance  Myocardial Infarction Mild myocardial infarction identified retrospectively during a hospital stay last year. No atrial fibrillation. Discussed increased risk of cardiac arrhythmias associated with untreated sleep apnea.  UACS Stable; No acute issues - Continue Flovent as directed   Recording duration: 22 minutes      Antonio Baumgarten, NP 07/06/2023

## 2023-07-14 DIAGNOSIS — E875 Hyperkalemia: Secondary | ICD-10-CM | POA: Diagnosis not present

## 2023-07-14 DIAGNOSIS — I10 Essential (primary) hypertension: Secondary | ICD-10-CM | POA: Diagnosis not present

## 2023-07-14 DIAGNOSIS — E213 Hyperparathyroidism, unspecified: Secondary | ICD-10-CM | POA: Diagnosis not present

## 2023-07-14 DIAGNOSIS — E119 Type 2 diabetes mellitus without complications: Secondary | ICD-10-CM | POA: Diagnosis not present

## 2023-07-20 DIAGNOSIS — M5416 Radiculopathy, lumbar region: Secondary | ICD-10-CM | POA: Diagnosis not present

## 2023-07-20 DIAGNOSIS — M48061 Spinal stenosis, lumbar region without neurogenic claudication: Secondary | ICD-10-CM | POA: Diagnosis not present

## 2023-07-20 DIAGNOSIS — Z6834 Body mass index (BMI) 34.0-34.9, adult: Secondary | ICD-10-CM | POA: Diagnosis not present

## 2023-07-28 DIAGNOSIS — E213 Hyperparathyroidism, unspecified: Secondary | ICD-10-CM | POA: Diagnosis not present

## 2023-07-28 DIAGNOSIS — Z6831 Body mass index (BMI) 31.0-31.9, adult: Secondary | ICD-10-CM | POA: Diagnosis not present

## 2023-07-28 DIAGNOSIS — K219 Gastro-esophageal reflux disease without esophagitis: Secondary | ICD-10-CM | POA: Diagnosis not present

## 2023-07-28 DIAGNOSIS — E875 Hyperkalemia: Secondary | ICD-10-CM | POA: Diagnosis not present

## 2023-07-28 DIAGNOSIS — I10 Essential (primary) hypertension: Secondary | ICD-10-CM | POA: Diagnosis not present

## 2023-07-30 DIAGNOSIS — M5416 Radiculopathy, lumbar region: Secondary | ICD-10-CM | POA: Diagnosis not present

## 2023-07-30 DIAGNOSIS — M5137 Other intervertebral disc degeneration, lumbosacral region with discogenic back pain only: Secondary | ICD-10-CM | POA: Diagnosis not present

## 2023-07-30 DIAGNOSIS — M419 Scoliosis, unspecified: Secondary | ICD-10-CM | POA: Diagnosis not present

## 2023-07-30 DIAGNOSIS — M47816 Spondylosis without myelopathy or radiculopathy, lumbar region: Secondary | ICD-10-CM | POA: Diagnosis not present

## 2023-07-30 DIAGNOSIS — M5136 Other intervertebral disc degeneration, lumbar region with discogenic back pain only: Secondary | ICD-10-CM | POA: Diagnosis not present

## 2023-08-05 ENCOUNTER — Telehealth: Payer: Self-pay | Admitting: Primary Care

## 2023-08-05 DIAGNOSIS — H01002 Unspecified blepharitis right lower eyelid: Secondary | ICD-10-CM | POA: Diagnosis not present

## 2023-08-05 DIAGNOSIS — H01005 Unspecified blepharitis left lower eyelid: Secondary | ICD-10-CM | POA: Diagnosis not present

## 2023-08-05 DIAGNOSIS — Z961 Presence of intraocular lens: Secondary | ICD-10-CM | POA: Diagnosis not present

## 2023-08-05 DIAGNOSIS — H43811 Vitreous degeneration, right eye: Secondary | ICD-10-CM | POA: Diagnosis not present

## 2023-08-05 DIAGNOSIS — H01001 Unspecified blepharitis right upper eyelid: Secondary | ICD-10-CM | POA: Diagnosis not present

## 2023-08-05 DIAGNOSIS — H02834 Dermatochalasis of left upper eyelid: Secondary | ICD-10-CM | POA: Diagnosis not present

## 2023-08-05 DIAGNOSIS — H02831 Dermatochalasis of right upper eyelid: Secondary | ICD-10-CM | POA: Diagnosis not present

## 2023-08-05 DIAGNOSIS — H01004 Unspecified blepharitis left upper eyelid: Secondary | ICD-10-CM | POA: Diagnosis not present

## 2023-08-05 DIAGNOSIS — H11153 Pinguecula, bilateral: Secondary | ICD-10-CM | POA: Diagnosis not present

## 2023-08-05 NOTE — Telephone Encounter (Signed)
 LVM for patient to call and discuss scheduling the follow up appointment due in September with Almarie Ferrari, NP---will offer [facility] [address] or [facility] location

## 2023-08-06 ENCOUNTER — Telehealth: Payer: Self-pay

## 2023-08-06 NOTE — Telephone Encounter (Signed)
 Copied from CRM [id number]. Topic: Clinical - Order For Equipment >> Aug 06, 2023 10:23 AM Barbara [name] wrote: Reason for CRM: Patient is calling to inform office that she is unable to get her CPAP machine because her insurance is OON.   Callback number: [phone]

## 2023-08-17 DIAGNOSIS — Z1231 Encounter for screening mammogram for malignant neoplasm of breast: Secondary | ICD-10-CM | POA: Diagnosis not present

## 2023-08-20 NOTE — Procedures (Signed)
 SABRA

## 2023-09-10 DIAGNOSIS — M5416 Radiculopathy, lumbar region: Secondary | ICD-10-CM | POA: Diagnosis not present

## 2023-09-15 DIAGNOSIS — R946 Abnormal results of thyroid function studies: Secondary | ICD-10-CM | POA: Diagnosis not present

## 2023-09-15 DIAGNOSIS — E559 Vitamin D deficiency, unspecified: Secondary | ICD-10-CM | POA: Diagnosis not present

## 2023-09-17 LAB — COMPREHENSIVE METABOLIC PANEL WITH GFR
ALT: 35 IU/L — ABNORMAL HIGH (ref 0–32)
AST: 19 IU/L (ref 0–40)
Albumin: 4.3 g/dL (ref 3.8–4.8)
Alkaline Phosphatase: 132 IU/L — ABNORMAL HIGH (ref 44–121)
BUN/Creatinine Ratio: 24 (ref 12–28)
BUN: 19 mg/dL (ref 8–27)
Bilirubin Total: 0.4 mg/dL (ref 0.0–1.2)
CO2: 22 mmol/L (ref 20–29)
Calcium: 10.4 mg/dL — ABNORMAL HIGH (ref 8.7–10.3)
Chloride: 103 mmol/L (ref 96–106)
Creatinine, Ser: 0.79 mg/dL (ref 0.57–1.00)
Globulin, Total: 2.2 g/dL (ref 1.5–4.5)
Glucose: 84 mg/dL (ref 70–99)
Potassium: 4 mmol/L (ref 3.5–5.2)
Sodium: 142 mmol/L (ref 134–144)
Total Protein: 6.5 g/dL (ref 6.0–8.5)
eGFR: 78 mL/min/1.73 (ref >59)

## 2023-09-17 LAB — PTH, INTACT AND CALCIUM: PTH: 61 pg/mL (ref 15–65)

## 2023-09-17 LAB — T4, FREE: Free T4: 0.86 ng/dL (ref 0.82–1.77)

## 2023-09-17 LAB — VITAMIN D 25 HYDROXY (VIT D DEFICIENCY, FRACTURES): Vit D, 25-Hydroxy: 56 ng/mL (ref 30.0–100.0)

## 2023-09-17 LAB — TSH: TSH: 2.08 u[IU]/mL (ref 0.450–4.500)

## 2023-09-17 LAB — T3, FREE: T3, Free: 2.3 pg/mL (ref 2.0–4.4)

## 2023-09-18 DIAGNOSIS — E782 Mixed hyperlipidemia: Secondary | ICD-10-CM | POA: Diagnosis not present

## 2023-09-18 DIAGNOSIS — I10 Essential (primary) hypertension: Secondary | ICD-10-CM | POA: Diagnosis not present

## 2023-09-18 DIAGNOSIS — R0609 Other forms of dyspnea: Secondary | ICD-10-CM | POA: Diagnosis not present

## 2023-09-18 DIAGNOSIS — R079 Chest pain, unspecified: Secondary | ICD-10-CM | POA: Diagnosis not present

## 2023-09-25 ENCOUNTER — Ambulatory Visit: Admitting: Nurse Practitioner

## 2023-10-01 ENCOUNTER — Telehealth: Payer: Self-pay | Admitting: *Deleted

## 2023-10-01 ENCOUNTER — Ambulatory Visit: Admitting: Nurse Practitioner

## 2023-10-01 ENCOUNTER — Encounter: Payer: Self-pay | Admitting: Nurse Practitioner

## 2023-10-01 DIAGNOSIS — R946 Abnormal results of thyroid function studies: Secondary | ICD-10-CM | POA: Diagnosis not present

## 2023-10-01 DIAGNOSIS — E559 Vitamin D deficiency, unspecified: Secondary | ICD-10-CM

## 2023-10-01 MED ORDER — ALBUTEROL SULFATE HFA 108 (90 BASE) MCG/ACT IN AERS: 2.0000 | INHALATION_SPRAY | RESPIRATORY_TRACT | 99 refills | Status: AC | PRN

## 2023-10-01 NOTE — Progress Notes (Signed)
 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   Past Medical History:  Diagnosis Date   Anxiety    Asthma    Bladder spasms    DDD (degenerative disc disease)    GERD (gastroesophageal reflux disease)    Hypercholesteremia    Sciatic nerve disease    Sleep apnea     Past Surgical History:  Procedure Laterality Date   ABDOMINAL HYSTERECTOMY     BREAST BIOPSY     precancer-left   CATARACT EXTRACTION W/PHACO Left 08/04/2016   Procedure: CATARACT EXTRACTION PHACO AND INTRAOCULAR LENS PLACEMENT (IOC);  Surgeon: [name], MD;  Location: [facility];  Service: Ophthalmology;  Laterality: Left;  CDE: 5.48   CATARACT EXTRACTION W/PHACO Right 08/18/2016   Procedure: CATARACT EXTRACTION PHACO AND INTRAOCULAR LENS PLACEMENT RIGHT EYE;  Surgeon: [name], MD;  Location: [facility];  Service: Ophthalmology;  Laterality: Right;  CDE: 8.30    COLONOSCOPY     12 years ago, Dr. Shaaron, polyp   complete hysterectomy  2000   fibroids   TONSILLECTOMY     TUBAL LIGATION      Social History   Tobacco Use   Smoking status: Never   Smokeless tobacco: Never  Vaping Use   Vaping status: Never Used  Substance Use Topics   Alcohol use: No   Drug use: No    Family History  Problem Relation Age of Onset   Colon cancer Maternal Grandmother    Breast cancer Mother    Breast cancer Maternal Aunt    Heart disease Other        father, brother   Lung cancer Maternal Grandfather    Inflammatory bowel disease Son        ?crohn's, followed at [facility] on Remicade   GI problems Brother        Bowel perforation   Ovarian cancer Neg Hx    Anesthesia problems Neg Hx    Hypotension Neg Hx    Malignant hyperthermia Neg Hx    Pseudochol deficiency Neg Hx     Outpatient Encounter  Medications as of 10/01/2023  Medication Sig   aspirin EC 81 MG tablet Take 81 mg by mouth daily. Swallow whole.   atorvastatin (LIPITOR) 40 MG tablet Take 40 mg by mouth every other day.   carvedilol (COREG) 6.25 MG tablet Take 6.25 mg by mouth 2 (two) times daily.   celecoxib (CELEBREX) 200 MG capsule Take 200 mg by mouth every other day.   famotidine  (PEPCID ) 20 MG tablet 20 mg at bedtime.   fluticasone (FLOVENT HFA) 110 MCG/ACT inhaler Inhale 2 puffs into the lungs 2 (two) times daily.   furosemide (LASIX) 20 MG tablet Take 1 tablet by mouth daily.   loratadine (CLARITIN) 10 MG tablet Take 10 mg by mouth daily.   losartan (COZAAR) 25 MG tablet Take 1 tablet by mouth daily.   MYRBETRIQ 50 MG TB24 tablet Take 50 mg by mouth daily.   nitrofurantoin, macrocrystal-monohydrate, (MACROBID)  100 MG capsule Take 100 mg by mouth 2 (two) times daily.   pantoprazole  (PROTONIX ) 40 MG tablet TAKE 1 TABLET BY MOUTH DAILY 30-60 MINUTES BEFORE first meal of THE DAY   polyethylene glycol (MIRALAX  / GLYCOLAX ) packet Take 17 g by mouth daily as needed for moderate constipation.   Probiotic Product (DIGESTIVE ADVANTAGE GUMMIES PO) Take 2 each by mouth daily.   Venlafaxine HCl 150 MG TB24 Take 1 tablet by mouth daily.   Vitamin D , Ergocalciferol , (DRISDOL ) 1.25 MG (50000 UNIT) CAPS capsule Take 1 capsule (50,000 Units total) by mouth every 7 (seven) days.   [DISCONTINUED] albuterol  (VENTOLIN  HFA) 108 (90 Base) MCG/ACT inhaler Inhale 2 puffs into the lungs every 4 (four) hours as needed.   albuterol  (VENTOLIN  HFA) 108 (90 Base) MCG/ACT inhaler Inhale 2 puffs into the lungs every 4 (four) hours as needed.   No facility-administered encounter medications on file as of 10/01/2023.    Allergies  Allergen Reactions   Bee Venom Swelling   Codeine     Chest pain   Penicillins Rash     HPI  Barbara [name] was diagnosed with hypercalcemia with first elevation noted in chart review to be in 2013.  Patient has no  previously known history of parathyroid, pituitary, adrenal dysfunctions; no family history of such dysfunctions. -Review of herreferral package of most recent labs reveals calcium of 10.5 the corresponding PTH of 70 on 10/20/22.  Patient has never had DEXA scan that she knows of.  No prior history of fragility fractures but recently did experience a fall back in September where she broke her wrist requiring surgery to fix. She DOES have history of kidney stones.  No history of CKD. Last BUN/Cr: 19/0.68 and GFR of 90.8  she is not on HCTZ or other thiazide therapy.  She does have history of vitamin D  deficiency. Last vitamin D  level was 25.9 on 03/30/20.  she is not on calcium supplements,  she eats dairy and green, leafy, vegetables on average amounts.  she does not have a family history of hypercalcemia, pituitary tumors, thyroid  cancer, or osteoporosis.   I reviewed her chart and she also has a history of HTN, asthma (following pulmonology), GERD, HLD, constipation, mild MI (follows cardiology).    Review of systems  Constitutional: + Minimally fluctuating body weight,  current Body mass index is 34.65 kg/m. , no fatigue, no subjective hyperthermia, no subjective hypothermia Eyes: no blurry vision, no xerophthalmia ENT: no sore throat, no nodules palpated in throat, no dysphagia/odynophagia, no hoarseness Cardiovascular: no chest pain, no shortness of breath, no palpitations, no leg swelling Respiratory: no cough, no shortness of breath Gastrointestinal: no nausea/vomiting/diarrhea Musculoskeletal: + intermittent diffuse muscle/joint aches Skin: no rashes, no hyperemia Neurological: no tremors, no numbness, no tingling, no dizziness Psychiatric: no depression, no anxiety   --------------------------------------------------------------------------------------------------------------------------  OBJECTIVE:  BP 126/70 (BP Location: Right Arm, Patient Position: Sitting, Cuff Size:  Large)   Pulse 66   Ht 5' (1.524 m)   Wt 177 lb 6.4 oz (80.5 kg)   BMI 34.65 kg/m , Body mass index is 34.65 kg/m. Wt Readings from Last 3 Encounters:  10/01/23 177 lb 6.4 oz (80.5 kg)  07/06/23 181 lb 9.6 oz (82.4 kg)  05/25/23 181 lb (82.1 kg)    BP Readings from Last 3 Encounters:  10/01/23 126/70  07/06/23 108/68  05/25/23 110/70      Physical Exam- Limited  Constitutional:  Body mass index is 34.65 kg/m. , not in acute  distress, normal state of mind Eyes:  EOMI, no exophthalmos Musculoskeletal: no gross deformities, strength intact in all four extremities, no gross restriction of joint movements Skin:  no rashes, no hyperemia Neurological: no tremor with outstretched hands   CMP ( most recent) CMP     Component Value Date/Time   NA 142 09/15/2023 0844   K 4.0 09/15/2023 0844   CL 103 09/15/2023 0844   CO2 22 09/15/2023 0844   GLUCOSE 84 09/15/2023 0844   GLUCOSE 148 (H) 08/03/2018 1112   BUN 19 09/15/2023 0844   CREATININE 0.79 09/15/2023 0844   CALCIUM 10.4 (H) 09/15/2023 0844   PROT 6.5 09/15/2023 0844   ALBUMIN 4.3 09/15/2023 0844   AST 19 09/15/2023 0844   ALT 35 (H) 09/15/2023 0844   ALKPHOS 132 (H) 09/15/2023 0844   BILITOT 0.4 09/15/2023 0844   EGFR 78 09/15/2023 0844   GFRNONAA >60 08/03/2018 1112     Diabetic Labs (most recent): No results found for: HGBA1C, MICROALBUR   Lipid Panel ( most recent) Lipid Panel  No results found for: CHOL, TRIG, HDL, CHOLHDL, VLDL, LDLCALC, LDLDIRECT, LABVLDL    Lab Results  Component Value Date   TSH 2.080 09/15/2023   TSH 1.350 05/19/2023   TSH 1.560 02/09/2023   TSH 0.952 09/26/2022   FREET4 0.86 09/15/2023   FREET4 0.79 (L) 05/19/2023   FREET4 0.71 (L) 02/09/2023     Latest Reference Range & Units 05/19/23 11:01  COMPREHENSIVE METABOLIC PANEL WITH GFR  Rpt !  Sodium 134 - 144 mmol/L 143  Potassium 3.5 - 5.2 mmol/L 4.5  Chloride 96 - 106 mmol/L 103  CO2 20 - 29 mmol/L 22   Glucose 70 - 99 mg/dL 87  BUN 8 - 27 mg/dL 16  Creatinine 9.42 - 8.99 mg/dL 9.33  Calcium 8.7 - 89.6 mg/dL 89.6  BUN/Creatinine Ratio 12 - 28  24  eGFR >59 mL/min/1.73 91  Alkaline Phosphatase 44 - 121 IU/L 132 (H)  Albumin 3.8 - 4.8 g/dL 4.3  AST 0 - 40 IU/L 16  ALT 0 - 32 IU/L 17  Total Protein 6.0 - 8.5 g/dL 6.4  Total Bilirubin 0.0 - 1.2 mg/dL 0.4  Vitamin D , 25-Hydroxy 30.0 - 100.0 ng/mL 51.4  Globulin, Total 1.5 - 4.5 g/dL 2.1  PTH, Intact 15 - 65 pg/mL 49  PTH-related peptide  WILL FOLLOW (P)  PTH Interp  Comment  TSH 0.450 - 4.500 uIU/mL 1.350  Triiodothyronine,Free,Serum 2.0 - 4.4 pg/mL 2.7  T4,Free(Direct) 0.82 - 1.77 ng/dL 9.20 (L)  !: Data is abnormal (H): Data is abnormally high (L): Data is abnormally low (P): Preliminary Rpt: View report in Results Review for more information    Latest Reference Range & Units 09/15/23 08:44  Comprehensive metabolic panel with GFR  Rpt !  Sodium 134 - 144 mmol/L 142  Potassium 3.5 - 5.2 mmol/L 4.0  Chloride 96 - 106 mmol/L 103  CO2 20 - 29 mmol/L 22  Glucose 70 - 99 mg/dL 84  BUN 8 - 27 mg/dL 19  Creatinine 9.42 - 8.99 mg/dL 9.20  Calcium 8.7 - 89.6 mg/dL 89.5 (H)  BUN/Creatinine Ratio 12 - 28  24  eGFR >59 mL/min/1.73 78  Alkaline Phosphatase 44 - 121 IU/L 132 (H)  Albumin 3.8 - 4.8 g/dL 4.3  AST 0 - 40 IU/L 19  ALT 0 - 32 IU/L 35 (H)  Total Protein 6.0 - 8.5 g/dL 6.5  Total Bilirubin 0.0 - 1.2 mg/dL 0.4  Vitamin D ,  25-Hydroxy 30.0 - 100.0 ng/mL 56.0  Globulin, Total 1.5 - 4.5 g/dL 2.2  PTH, Intact 15 - 65 pg/mL 61  PTH Interp  Comment  TSH 0.450 - 4.500 uIU/mL 2.080  Triiodothyronine,Free,Serum 2.0 - 4.4 pg/mL 2.3  T4,Free(Direct) 0.82 - 1.77 ng/dL 9.13  !: Data is abnormal (H): Data is abnormally high Rpt: View report in Results Review for more information  --------------------------------------------------------------------------------------------------------------- Assessment/Plan: 1. Hypercalcemia /  Hyperparathyroidism  24 hour urine specimen rules out [facility].   I was first suspecting this could be related to vitamin D  deficiency, however calcium level still remains marginally elevated even though Vitamin D  deficiency was corrected.  PTH was normal but has increased since last visit.  She does have some symptoms of hyperparathyroidism such as kidney stones.  I did recommend proceeding with NM parathyroid scan to help us  rule out parathyroid adenoma.  She does note she has trouble with breathing and feels a lump in her neck.  She has hx of thyroid  nodule which requires follow up ultrasound in January of 2026, but we cannot effectively rule out parathyroid enlargement as a possibility.  Will call patient with results and next steps.  If parathyroid adenoma present, will refer to Dr. Eletha to discuss possibly parathyroidectomy.    I spent  17  minutes in the care of the patient today including review of labs from Thyroid  Function, CMP, and other relevant labs ; imaging/biopsy records (current and previous including abstractions from other facilities); face-to-face time discussing  her lab results and symptoms, medications doses, her options of short and long term treatment based on the latest standards of care / guidelines;   and documenting the encounter.  Zebedee JONETTA Monte  participated in the discussions, expressed understanding, and voiced agreement with the above plans.  All questions were answered to her satisfaction. she is encouraged to contact clinic should she have any questions or concerns prior to her return visit.   Follow Up Plan: - Return parathyroid scan- will call with results and next steps.  Benton Rio, [facility] [name] [place], [place] [address] Phone: [phone] Fax: [phone]  10/01/2023, 9:41 AM

## 2023-10-01 NOTE — Telephone Encounter (Signed)
 Patient left a message asking that Barbara [name] refill her Coreg, she failed to mention at the time of her office visit today.

## 2023-10-02 NOTE — Telephone Encounter (Signed)
 That medication would come from her PCP.  I had just discussed with her that beta-blockers can sometimes help with symptoms of an over-active thyroid  gland.  She specifically asked about meds to help with hot flashes.  I had asked her to call and let me know if she was still taking the Coreg as she was unsure at the appt.

## 2023-10-05 NOTE — Telephone Encounter (Signed)
 Patient was called and made aware that the Coreg would need to come from PCP. She responded with I know that comes from my PCP, and I wo uld have called you back, but did not want to bother you. She then ask for something for the hot flashes. I told her that this would need to come from her PCP or GYN.

## 2023-10-06 ENCOUNTER — Encounter (HOSPITAL_COMMUNITY)
Admission: RE | Admit: 2023-10-06 | Discharge: 2023-10-06 | Disposition: A | Discharge status: Home / Self Care | Source: Ambulatory Visit | Attending: Nurse Practitioner | Admitting: Nurse Practitioner

## 2023-10-06 DIAGNOSIS — E213 Hyperparathyroidism, unspecified: Secondary | ICD-10-CM | POA: Diagnosis not present

## 2023-10-06 MED ORDER — TECHNETIUM TC 99M SESTAMIBI - CARDIOLITE
25.0000 | Freq: Once | INTRAVENOUS | Status: AC | PRN
  Administered 2023-10-06: 27 via INTRAVENOUS

## 2023-10-12 ENCOUNTER — Ambulatory Visit: Payer: Self-pay | Admitting: Nurse Practitioner

## 2023-10-12 DIAGNOSIS — R946 Abnormal results of thyroid function studies: Secondary | ICD-10-CM

## 2023-10-12 DIAGNOSIS — E559 Vitamin D deficiency, unspecified: Secondary | ICD-10-CM

## 2023-10-12 NOTE — Progress Notes (Signed)
 Please schedule for follow up in January 2026.  Will need thyroid  ultrasound and labs prior to visit.  I have entered the orders for both.

## 2023-10-19 DIAGNOSIS — M1612 Unilateral primary osteoarthritis, left hip: Secondary | ICD-10-CM | POA: Diagnosis not present

## 2023-10-20 NOTE — Telephone Encounter (Signed)
 Contacted Mrs. Dargan to inform her that I sent the CPAP order to [facility], since her insurance does not qualify for a CPAP with [employer]. No answer, left a voicemail requesting a call back. Will follow up and give the patient a call back.

## 2023-10-27 NOTE — Telephone Encounter (Signed)
 Called Mrs. Fundora to provide an update regarding the CPAP order received from D[facility] and to confirm if the patient has been contacted. No answer. Left a voicemail requesting a call back if she has any questions or concerns. Nothing further needed at this time.

## 2023-11-16 ENCOUNTER — Telehealth: Payer: Self-pay

## 2023-11-16 NOTE — Telephone Encounter (Signed)
 Cmn received from [facility] for cpap supplies. This is only requiring a signature from Landry Ferrari, NP. Will fax when signed.

## 2023-11-23 NOTE — Telephone Encounter (Signed)
 This has been signed and faxed. NFN

## 2023-11-24 DIAGNOSIS — E119 Type 2 diabetes mellitus without complications: Secondary | ICD-10-CM | POA: Diagnosis not present

## 2023-11-24 DIAGNOSIS — E039 Hypothyroidism, unspecified: Secondary | ICD-10-CM | POA: Diagnosis not present

## 2023-11-24 DIAGNOSIS — I10 Essential (primary) hypertension: Secondary | ICD-10-CM | POA: Diagnosis not present

## 2023-11-24 DIAGNOSIS — E875 Hyperkalemia: Secondary | ICD-10-CM | POA: Diagnosis not present

## 2023-12-01 DIAGNOSIS — E875 Hyperkalemia: Secondary | ICD-10-CM | POA: Diagnosis not present

## 2023-12-01 DIAGNOSIS — Z0001 Encounter for general adult medical examination with abnormal findings: Secondary | ICD-10-CM | POA: Diagnosis not present

## 2023-12-01 DIAGNOSIS — I10 Essential (primary) hypertension: Secondary | ICD-10-CM | POA: Diagnosis not present

## 2023-12-01 DIAGNOSIS — E213 Hyperparathyroidism, unspecified: Secondary | ICD-10-CM | POA: Diagnosis not present

## 2023-12-01 DIAGNOSIS — M25552 Pain in left hip: Secondary | ICD-10-CM | POA: Diagnosis not present

## 2023-12-01 DIAGNOSIS — E041 Nontoxic single thyroid nodule: Secondary | ICD-10-CM | POA: Diagnosis not present

## 2023-12-01 DIAGNOSIS — F32A Depression, unspecified: Secondary | ICD-10-CM | POA: Diagnosis not present

## 2023-12-01 DIAGNOSIS — M25551 Pain in right hip: Secondary | ICD-10-CM | POA: Diagnosis not present

## 2023-12-09 ENCOUNTER — Encounter: Payer: Self-pay | Admitting: Physician Assistant

## 2023-12-09 DIAGNOSIS — R413 Other amnesia: Secondary | ICD-10-CM | POA: Diagnosis not present

## 2023-12-09 DIAGNOSIS — E041 Nontoxic single thyroid nodule: Secondary | ICD-10-CM | POA: Diagnosis not present

## 2023-12-09 DIAGNOSIS — F32A Depression, unspecified: Secondary | ICD-10-CM | POA: Diagnosis not present

## 2023-12-09 DIAGNOSIS — G4733 Obstructive sleep apnea (adult) (pediatric): Secondary | ICD-10-CM | POA: Diagnosis not present

## 2023-12-09 DIAGNOSIS — I10 Essential (primary) hypertension: Secondary | ICD-10-CM | POA: Diagnosis not present

## 2023-12-09 DIAGNOSIS — Z6831 Body mass index (BMI) 31.0-31.9, adult: Secondary | ICD-10-CM | POA: Diagnosis not present

## 2023-12-17 DIAGNOSIS — R413 Other amnesia: Secondary | ICD-10-CM | POA: Diagnosis not present

## 2023-12-17 DIAGNOSIS — I6782 Cerebral ischemia: Secondary | ICD-10-CM | POA: Diagnosis not present

## 2023-12-17 DIAGNOSIS — R7989 Other specified abnormal findings of blood chemistry: Secondary | ICD-10-CM | POA: Diagnosis not present

## 2023-12-17 DIAGNOSIS — Z114 Encounter for screening for human immunodeficiency virus [HIV]: Secondary | ICD-10-CM | POA: Diagnosis not present

## 2023-12-17 DIAGNOSIS — D519 Vitamin B12 deficiency anemia, unspecified: Secondary | ICD-10-CM | POA: Diagnosis not present

## 2024-01-20 NOTE — Progress Notes (Addendum)
 Date of COVID positive in last 90 days:  No  PCP - Vaughn Pouch, MD Cardiologist - Prentice Medley, FNP Pulmonologist - Almarie Ferrari, NP  Chest x-ray - N/A EKG - 09-18-23 [facility] (copy on chart) Stress Test - 08-12-22 [facility] ECHO - 07-17-22 [facility] Cardiac Cath - N/A Pacemaker/ICD device last checked:N/A Spinal Cord Stimulator:N/A  Bowel Prep - N/A  Sleep Study - Yes, +sleep apnea CPAP - No  Fasting Blood Sugar - N/A Checks Blood Sugar _____ times a day  Last dose of GLP1 agonist-  N/A GLP1 instructions:  Do not take after     Last dose of SGLT-2 inhibitors-  N/A SGLT-2 instructions:  Do not take after    Blood Thinner Instructions: N/A Last dose:   Time: Aspirin Instructions:  ASA 81 (hold a week prior to surgery per patient) Last Dose:  Activity level:  Can go up a flight of stairs and perform activities of daily living without stopping and without symptoms of chest pain or shortness of breath.  Anesthesia review: Chest pain evaluated by cardiology.  Patient states no recent episodes of chest pain.  Patient denies shortness of breath, fever, cough and chest pain at PAT appointment  Patient verbalized understanding of instructions that were given to them at the PAT appointment. Patient was also instructed that they will need to review over the PAT instructions again at home before surgery.

## 2024-01-20 NOTE — Patient Instructions (Addendum)
 SURGICAL WAITING ROOM VISITATION Patients having surgery or a procedure may have no more than 2 support people in the waiting area - these visitors may rotate.    Children under the age of 66 will not be allowed to visit due to the increase in respiratory illness  Children under the age of 52 must have an adult with them who is not the patient.  If the patient needs to stay at the hospital during part of their recovery, the visitor guidelines for inpatient rooms apply. Pre-op nurse will coordinate an appropriate time for 1 support person to accompany patient in pre-op.  This support person may not rotate.    Please refer to the [facility] website for the visitor guidelines for Inpatients (after your surgery is over and you are in a regular room).       Your procedure is scheduled on: 02-02-24   Report to [facility] Main Entrance    Report to admitting at 11:00 AM   Call this number if you have problems the morning of surgery [phone]   Do not eat food :After Midnight.   After Midnight you may have the following liquids until 10:30 AM DAY OF SURGERY  Water  Non-Citrus Juices (without pulp, NO RED-Apple, White grape, White cranberry) Black Coffee (NO MILK/CREAM OR CREAMERS, sugar ok)  Clear Tea (NO MILK/CREAM OR CREAMERS, sugar ok) regular and decaf                             Plain Jell-O (NO RED)                                           Fruit ices (not with fruit pulp, NO RED)                                     Popsicles (NO RED)                                                               Sports drinks like Gatorade (NO RED)                   The day of surgery:  Drink ONE (1) Pre-Surgery Clear Ensure by 10:30 AM the morning of surgery. Drink in one sitting. Do not sip.  This drink was given to you during your hospital  pre-op appointment visit. Nothing else to drink after completing the Pre-Surgery Clear Ensure.          If you have questions, please  contact your surgeons office.   FOLLOW  ANY ADDITIONAL PRE OP INSTRUCTIONS YOU RECEIVED FROM YOUR SURGEON'S OFFICE!!!     Oral Hygiene is also important to reduce your risk of infection.                                    Remember - BRUSH YOUR TEETH THE MORNING OF SURGERY WITH YOUR REGULAR TOOTHPASTE   Do NOT smoke after Midnight  Take these medicines the morning of surgery with A SIP OF WATER :    Atorvastatin   Myrbetriq   Venlafaxine   Okay to use inhalers  Stop all vitamins and herbal supplements 7 days before surgery                              You may not have any metal on your body including hair pins, jewelry, and body piercing             Do not wear make-up, lotions, powders, perfumes, or deodorant  Do not wear nail polish including gel and S&S, artificial/acrylic nails, or any other type of covering on natural nails including finger and toenails. If you have artificial nails, gel coating, etc. that needs to be removed by a nail salon please have this removed prior to surgery or surgery may need to be canceled/ delayed if the surgeon/ anesthesia feels like they are unable to be safely monitored.   Do not shave  48 hours prior to surgery.          Do not bring valuables to the hospital. [facility] IS NOT RESPONSIBLE   FOR VALUABLES.   Contacts, dentures or bridgework may not be worn into surgery.   Bring small overnight bag day of surgery.   DO NOT BRING YOUR HOME MEDICATIONS TO THE HOSPITAL. PHARMACY WILL DISPENSE MEDICATIONS LISTED ON YOUR MEDICATION LIST TO YOU DURING YOUR ADMISSION IN THE HOSPITAL!              Please read over the following fact sheets you were given: IF YOU HAVE QUESTIONS ABOUT YOUR PRE-OP INSTRUCTIONS PLEASE CALL [phone] [name]  If you received a COVID test during your pre-op visit  it is requested that you wear a mask when out in public, stay away from anyone that may not be feeling well and notify your surgeon if you develop symptoms. If  you test positive for Covid or have been in contact with anyone that has tested positive in the last 10 days please notify you surgeon.   Pre-operative 4 CHG Bath Instructions  DYNA-Hex 4 Chlorhexidine Gluconate 4% Solution Antiseptic 4 fl. oz   You can play a key role in reducing the risk of infection after surgery. Your skin needs to be as free of germs as possible. You can reduce the number of germs on your skin by washing with CHG (chlorhexidine gluconate) soap before surgery. CHG is an antiseptic soap that kills germs and continues to kill germs even after washing.   DO NOT use if you have an allergy to chlorhexidine/CHG or antibacterial soaps. If your skin becomes reddened or irritated, stop using the CHG and notify one of our RNs at   Please shower with the CHG soap starting 4 days before surgery using the following schedule:     Please keep in mind the following:  DO NOT shave, including legs and underarms, starting the day of your first shower.   You may shave your face at any point before/day of surgery.  Place clean sheets on your bed the day you start using CHG soap. Use a clean washcloth (not used since being washed) for each shower. DO NOT sleep with pets once you start using the CHG.  CHG Shower Instructions:  If you choose to wash your hair and private area, wash first with your normal shampoo/soap.  After you use shampoo/soap, rinse your hair and body  thoroughly to remove shampoo/soap residue.  Turn the water  OFF and apply about 3 tablespoons (45 ml) of CHG soap to a CLEAN washcloth.  Apply CHG soap ONLY FROM YOUR NECK DOWN TO YOUR TOES (washing for 3-5 minutes)  DO NOT use CHG soap on face, private areas, open wounds, or sores.  Pay special attention to the area where your surgery is being performed.  If you are having back surgery, having someone wash your back for you may be helpful. Wait 2 minutes after CHG soap is applied, then you may rinse off the CHG soap.  Pat  dry with a clean towel  Put on clean clothes/pajamas   If you choose to wear lotion, please use ONLY the CHG-compatible lotions on the back of this paper.     Additional instructions for the day of surgery:  Shower with regular soap the day of surgery DO NOT APPLY any lotions, deodorants, cologne, or perfumes.   Put on clean/comfortable clothes.  Brush your teeth.  Ask your nurse before applying any prescription medications to the skin.   CHG Compatible Lotions   Aveeno Moisturizing lotion  Cetaphil Moisturizing Cream  Cetaphil Moisturizing Lotion  Clairol Herbal Essence Moisturizing Lotion, Dry Skin  Clairol Herbal Essence Moisturizing Lotion, Extra Dry Skin  Clairol Herbal Essence Moisturizing Lotion, Normal Skin  Curel Age Defying Therapeutic Moisturizing Lotion with Alpha Hydroxy  Curel Extreme Care Body Lotion  Curel Soothing Hands Moisturizing Hand Lotion  Curel Therapeutic Moisturizing Cream, Fragrance-Free  Curel Therapeutic Moisturizing Lotion, Fragrance-Free  Curel Therapeutic Moisturizing Lotion, Original Formula  Eucerin Daily Replenishing Lotion  Eucerin Dry Skin Therapy Plus Alpha Hydroxy Crme  Eucerin Dry Skin Therapy Plus Alpha Hydroxy Lotion  Eucerin Original Crme  Eucerin Original Lotion  Eucerin Plus Crme Eucerin Plus Lotion  Eucerin TriLipid Replenishing Lotion  Keri Anti-Bacterial Hand Lotion  Keri Deep Conditioning Original Lotion Dry Skin Formula Softly Scented  Keri Deep Conditioning Original Lotion, Fragrance Free Sensitive Skin Formula  Keri Lotion Fast Absorbing Fragrance Free Sensitive Skin Formula  Keri Lotion Fast Absorbing Softly Scented Dry Skin Formula  Keri Original Lotion  Keri Skin Renewal Lotion Keri Silky Smooth Lotion  Keri Silky Smooth Sensitive Skin Lotion  Nivea Body Creamy Conditioning Oil  Nivea Body Extra Enriched Lotion  Nivea Body Original Lotion  Nivea Body Sheer Moisturizing Lotion Nivea Crme  Nivea Skin Firming  Lotion  NutraDerm 30 Skin Lotion  NutraDerm Skin Lotion  NutraDerm Therapeutic Skin Cream  NutraDerm Therapeutic Skin Lotion  ProShield Protective Hand Cream  Provon moisturizing lotion   PATIENT SIGNATURE_________________________________  NURSE SIGNATURE__________________________________  ________________________________________________________________________    Barbara [name]  An incentive spirometer is a tool that can help keep your lungs clear and active. This tool measures how well you are filling your lungs with each breath. Taking long deep breaths may help reverse or decrease the chance of developing breathing (pulmonary) problems (especially infection) following: A long period of time when you are unable to move or be active. BEFORE THE PROCEDURE  If the spirometer includes an indicator to show your best effort, your nurse or respiratory therapist will set it to a desired goal. If possible, sit up straight or lean slightly forward. Try not to slouch. Hold the incentive spirometer in an upright position. INSTRUCTIONS FOR USE  Sit on the edge of your bed if possible, or sit up as far as you can in bed or on a chair. Hold the incentive spirometer in an upright position. Breathe out normally.  Place the mouthpiece in your mouth and seal your lips tightly around it. Breathe in slowly and as deeply as possible, raising the piston or the ball toward the top of the column. Hold your breath for 3-5 seconds or for as long as possible. Allow the piston or ball to fall to the bottom of the column. Remove the mouthpiece from your mouth and breathe out normally. Rest for a few seconds and repeat Steps 1 through 7 at least 10 times every 1-2 hours when you are awake. Take your time and take a few normal breaths between deep breaths. The spirometer may include an indicator to show your best effort. Use the indicator as a goal to work toward during each repetition. After each set of  10 deep breaths, practice coughing to be sure your lungs are clear. If you have an incision (the cut made at the time of surgery), support your incision when coughing by placing a pillow or rolled up towels firmly against it. Once you are able to get out of bed, walk around indoors and cough well. You may stop using the incentive spirometer when instructed by your caregiver.  RISKS AND COMPLICATIONS Take your time so you do not get dizzy or light-headed. If you are in pain, you may need to take or ask for pain medication before doing incentive spirometry. It is harder to take a deep breath if you are having pain. AFTER USE Rest and breathe slowly and easily. It can be helpful to keep track of a log of your progress. Your caregiver can provide you with a simple table to help with this. If you are using the spirometer at home, follow these instructions: SEEK MEDICAL CARE IF:  You are having difficultly using the spirometer. You have trouble using the spirometer as often as instructed. Your pain medication is not giving enough relief while using the spirometer. You develop fever of 100.5 F (38.1 C) or higher. SEEK IMMEDIATE MEDICAL CARE IF:  You cough up bloody sputum that had not been present before. You develop fever of 102 F (38.9 C) or greater. You develop worsening pain at or near the incision site. MAKE SURE YOU:  Understand these instructions. Will watch your condition. Will get help right away if you are not doing well or get worse. Document Released: 05/26/2006 Document Revised: 04/07/2011 Document Reviewed: 07/27/2006 Exit[employer] Patient Information 2014 [employer], [place].   ________________________________________________________________________ WHAT IS A BLOOD TRANSFUSION? Blood Transfusion Information  A transfusion is the replacement of blood or some of its parts. Blood is made up of multiple cells which provide different functions. Red blood cells carry oxygen and are used  for blood loss replacement. White blood cells fight against infection. Platelets control bleeding. Plasma helps clot blood. Other blood products are available for specialized needs, such as hemophilia or other clotting disorders. BEFORE THE TRANSFUSION  Who gives blood for transfusions?  Healthy volunteers who are fully evaluated to make sure their blood is safe. This is blood bank blood. Transfusion therapy is the safest it has ever been in the practice of medicine. Before blood is taken from a donor, a complete history is taken to make sure that person has no history of diseases nor engages in risky social behavior (examples are intravenous drug use or sexual activity with multiple partners). The donor's travel history is screened to minimize risk of transmitting infections, such as malaria. The donated blood is tested for signs of infectious diseases, such as HIV and hepatitis. The blood  is then tested to be sure it is compatible with you in order to minimize the chance of a transfusion reaction. If you or a relative donates blood, this is often done in anticipation of surgery and is not appropriate for emergency situations. It takes many days to process the donated blood. RISKS AND COMPLICATIONS Although transfusion therapy is very safe and saves many lives, the main dangers of transfusion include:  Getting an infectious disease. Developing a transfusion reaction. This is an allergic reaction to something in the blood you were given. Every precaution is taken to prevent this. The decision to have a blood transfusion has been considered carefully by your caregiver before blood is given. Blood is not given unless the benefits outweigh the risks. AFTER THE TRANSFUSION Right after receiving a blood transfusion, you will usually feel much better and more energetic. This is especially true if your red blood cells have gotten low (anemic). The transfusion raises the level of the red blood cells which  carry oxygen, and this usually causes an energy increase. The nurse administering the transfusion will monitor you carefully for complications. HOME CARE INSTRUCTIONS  No special instructions are needed after a transfusion. You may find your energy is better. Speak with your caregiver about any limitations on activity for underlying diseases you may have. SEEK MEDICAL CARE IF:  Your condition is not improving after your transfusion. You develop redness or irritation at the intravenous (IV) site. SEEK IMMEDIATE MEDICAL CARE IF:  Any of the following symptoms occur over the next 12 hours: Shaking chills. You have a temperature by mouth above 102 F (38.9 C), not controlled by medicine. Chest, back, or muscle pain. People around you feel you are not acting correctly or are confused. Shortness of breath or difficulty breathing. Dizziness and fainting. You get a rash or develop hives. You have a decrease in urine output. Your urine turns a dark color or changes to pink, red, or brown. Any of the following symptoms occur over the next 10 days: You have a temperature by mouth above 102 F (38.9 C), not controlled by medicine. Shortness of breath. Weakness after normal activity. The white part of the eye turns yellow (jaundice). You have a decrease in the amount of urine or are urinating less often. Your urine turns a dark color or changes to pink, red, or brown. Document Released: 01/11/2000 Document Revised: 04/07/2011 Document Reviewed: 08/30/2007 [facility] Patient Information 2014 [place], [place].  _______________________________________________________________________

## 2024-01-25 ENCOUNTER — Encounter (HOSPITAL_COMMUNITY)
Admission: RE | Admit: 2024-01-25 | Discharge: 2024-01-25 | Disposition: A | Discharge status: Home / Self Care | Source: Ambulatory Visit | Attending: Orthopedic Surgery | Admitting: Orthopedic Surgery

## 2024-01-25 ENCOUNTER — Encounter (HOSPITAL_COMMUNITY): Payer: Self-pay

## 2024-01-25 ENCOUNTER — Other Ambulatory Visit: Payer: Self-pay

## 2024-01-25 VITALS — BP 157/61 | HR 64 | Temp 98.1°F | Resp 16 | Ht 60.0 in | Wt 178.0 lb

## 2024-01-25 DIAGNOSIS — Z01812 Encounter for preprocedural laboratory examination: Secondary | ICD-10-CM | POA: Insufficient documentation

## 2024-01-25 DIAGNOSIS — K219 Gastro-esophageal reflux disease without esophagitis: Secondary | ICD-10-CM | POA: Diagnosis not present

## 2024-01-25 DIAGNOSIS — G4733 Obstructive sleep apnea (adult) (pediatric): Secondary | ICD-10-CM | POA: Insufficient documentation

## 2024-01-25 DIAGNOSIS — M1612 Unilateral primary osteoarthritis, left hip: Secondary | ICD-10-CM | POA: Diagnosis not present

## 2024-01-25 DIAGNOSIS — J45909 Unspecified asthma, uncomplicated: Secondary | ICD-10-CM | POA: Insufficient documentation

## 2024-01-25 DIAGNOSIS — I10 Essential (primary) hypertension: Secondary | ICD-10-CM | POA: Insufficient documentation

## 2024-01-25 DIAGNOSIS — F419 Anxiety disorder, unspecified: Secondary | ICD-10-CM | POA: Insufficient documentation

## 2024-01-25 DIAGNOSIS — F32A Depression, unspecified: Secondary | ICD-10-CM | POA: Insufficient documentation

## 2024-01-25 DIAGNOSIS — Z01818 Encounter for other preprocedural examination: Secondary | ICD-10-CM

## 2024-01-25 HISTORY — DX: Depression, unspecified: F32.A

## 2024-01-25 HISTORY — DX: Essential (primary) hypertension: I10

## 2024-01-25 HISTORY — DX: Personal history of urinary calculi: Z87.442

## 2024-01-25 HISTORY — DX: Unspecified osteoarthritis, unspecified site: M19.90

## 2024-01-25 LAB — CBC
HCT: 42.4 % (ref 36.0–46.0)
Hemoglobin: 13.6 g/dL (ref 12.0–15.0)
MCH: 32.2 pg (ref 26.0–34.0)
MCHC: 32.1 g/dL (ref 30.0–36.0)
MCV: 100.5 fL — ABNORMAL HIGH (ref 80.0–100.0)
Platelets: 284 K/uL (ref 150–400)
RBC: 4.22 MIL/uL (ref 3.87–5.11)
RDW: 14 % (ref 11.5–15.5)
WBC: 6.3 K/uL (ref 4.0–10.5)
nRBC: 0 % (ref 0.0–0.2)

## 2024-01-25 LAB — BASIC METABOLIC PANEL WITH GFR
Anion gap: 9 (ref 5–15)
BUN: 12 mg/dL (ref 8–23)
CO2: 30 mmol/L (ref 22–32)
Calcium: 10.8 mg/dL — ABNORMAL HIGH (ref 8.9–10.3)
Chloride: 106 mmol/L (ref 98–111)
Creatinine, Ser: 0.69 mg/dL (ref 0.44–1.00)
GFR, Estimated: >60 mL/min
Glucose, Bld: 105 mg/dL — ABNORMAL HIGH (ref 70–99)
Potassium: 4.5 mmol/L (ref 3.5–5.1)
Sodium: 144 mmol/L (ref 135–145)

## 2024-01-25 LAB — SURGICAL PCR SCREEN
MRSA, PCR: NEGATIVE
Staphylococcus aureus: NEGATIVE

## 2024-01-26 ENCOUNTER — Encounter (HOSPITAL_COMMUNITY): Payer: Self-pay

## 2024-01-26 NOTE — Progress Notes (Signed)
 " Case: [id number] Date/Time: 02/02/24 1315   Procedure: ARTHROPLASTY, HIP, TOTAL, ANTERIOR APPROACH (Left: Hip)   Anesthesia type: Spinal   Diagnosis: Primary osteoarthritis of left hip [M16.12]   Pre-op diagnosis: Left hip osteoarthritis   Location: [facility] ROOM 10 / [facility]   Surgeons: Ernie Cough, MD       DISCUSSION: Barbara [name] is a 78 year old female with PMH of hypertension, OSA (no CPAP use), asthma, GERD, anxiety, depression, arthritis  Patient follows with cardiology at [facility] for history of chest pain.  She had a nuclear medicine stress test in 07/2022 which came back low risk.  Echo in 06/2022 showed normal LVEF without significant valvular disease.  She was last seen in clinic on 09/18/2023 by NP Richarda.  She was cleared for upcoming surgery:  Patient essentially has a revised cardiac risk index of 0 placing her at a 0.5% risk of major cardiac event perioperatively.  She is cleared to undergo left total hip arthroplasty under general anesthesia from a cardiac standpoint.  May discontinue aspirin both for surgery and start afterward if needed    VS: BP (!) 157/61   Pulse 64   Temp 36.7 C (Oral)   Resp 16   Ht 5' (1.524 m)   Wt 80.7 kg   SpO2 99%   BMI 34.76 kg/m   PROVIDERS: Trudy Vaughn FALCON, MD   LABS: Labs reviewed: Acceptable for surgery. (all labs ordered are listed, but only abnormal results are displayed)  Labs Reviewed  BASIC METABOLIC PANEL WITH GFR - Abnormal; Notable for the following components:      Result Value   Glucose, Bld 105 (*)    Calcium 10.8 (*)    All other components within normal limits  CBC - Abnormal; Notable for the following components:   MCV 100.5 (*)    All other components within normal limits  SURGICAL PCR SCREEN  TYPE AND SCREEN     EKG 09/18/2023 [facility]):  NSR rate of 76 with low voltage QRS, cannot rule out anterior infarct age undetermined.   NM stress test 08/12/2022 [facility]):  Impression 1. No reversible ischemia or  infarction.  2. Normal left ventricular wall motion.  3. Left ventricular ejection fraction 78%  4. Non invasive risk stratification*: Low    Echo 07/17/2022 [facility]):  Summary   1. The left ventricle is normal in size with normal wall thickness.   2. The left ventricular systolic function is normal, LVEF is visually estimated at 65-70%.   3. Mitral annular calcification is present (mild).   4. The aortic valve is trileaflet with mildly thickened leaflets with normal excursion.   5. The right ventricle is normal in size, with normal systolic function.  Past Medical History:  Diagnosis Date   Anxiety    Arthritis    Asthma    Bladder spasms    DDD (degenerative disc disease)    Depression    GERD (gastroesophageal reflux disease)    History of kidney stones    Hypercholesteremia    Hypertension    Sciatic nerve disease    Sleep apnea     Past Surgical History:  Procedure Laterality Date   ABDOMINAL HYSTERECTOMY     BREAST BIOPSY     precancer-left   CATARACT EXTRACTION W/PHACO Left 08/04/2016   Procedure: CATARACT EXTRACTION PHACO AND INTRAOCULAR LENS PLACEMENT (IOC);  Surgeon: [name], MD;  Location: [facility];  Service: Ophthalmology;  Laterality: Left;  CDE: 5.48   CATARACT EXTRACTION W/PHACO Right 08/18/2016  Procedure: CATARACT EXTRACTION PHACO AND INTRAOCULAR LENS PLACEMENT RIGHT EYE;  Surgeon: [name], MD;  Location: [facility];  Service: Ophthalmology;  Laterality: Right;  CDE: 8.30    COLONOSCOPY     12 years ago, Dr. Shaaron, polyp   complete hysterectomy  01/27/1998   fibroids   TONSILLECTOMY     TUBAL LIGATION     UPPER GI ENDOSCOPY      MEDICATIONS:  albuterol  (VENTOLIN  HFA) 108 (90 Base) MCG/ACT inhaler   aspirin EC 81 MG tablet   atorvastatin (LIPITOR) 80 MG tablet   fluticasone (FLOVENT HFA) 110 MCG/ACT inhaler   furosemide (LASIX) 20 MG tablet   losartan (COZAAR) 25 MG tablet   MYRBETRIQ 50 MG TB24 tablet   polyethylene glycol (MIRALAX  /  GLYCOLAX ) packet   Venlafaxine HCl 150 MG TB24   No current facility-administered medications for this encounter.   Burnard CHRISTELLA Odis DEVONNA [facility] Surgical Short Stay/Anesthesiology [facility] Phone [phone] 01/26/2024 10:24 AM        "

## 2024-01-26 NOTE — Anesthesia Preprocedure Evaluation (Addendum)
 "                                  Anesthesia Evaluation  Patient identified by MRN, date of birth, ID band Patient awake    Reviewed: Allergy & Precautions, NPO status , Patient's Chart, lab work & pertinent test results  Airway Mallampati: II  TM Distance: >3 FB Neck ROM: Full    Dental no notable dental hx. (+) Teeth Intact, Dental Advisory Given   Pulmonary asthma , sleep apnea    Pulmonary exam normal breath sounds clear to auscultation       Cardiovascular hypertension, (-) angina + Past MI (2023)  Normal cardiovascular exam Rhythm:Regular Rate:Normal  Echo 07/17/2022 [facility]):   Summary   1. The left ventricle is normal in size with normal wall thickness.   2. The left ventricular systolic function is normal, LVEF is visually estimated at 65-70%.   3. Mitral annular calcification is present (mild).   4. The aortic valve is trileaflet with mildly thickened leaflets with normal excursion.   5. The right ventricle is normal in size, with normal systolic function.    Neuro/Psych  PSYCHIATRIC DISORDERS Anxiety Depression    negative neurological ROS     GI/Hepatic ,GERD  ,,  Endo/Other    Renal/GU Lab Results      Component                Value               Date                           K                        4.5                 01/25/2024                     CREATININE               0.69                01/25/2024                     Musculoskeletal  (+) Arthritis ,  DDD   Abdominal   Peds  Hematology Lab Results      Component                Value               Date                      WBC                      6.3                 01/25/2024                HGB                      13.6                01/25/2024                HCT  42.4                01/25/2024                MCV                      100.5 (H)           01/25/2024                PLT                      284                 01/25/2024               Anesthesia Other Findings All: codeine, PCN  Reproductive/Obstetrics                              Anesthesia Physical Anesthesia Plan  ASA: 3  Anesthesia Plan: Spinal   Post-op Pain Management: Regional block* and Ofirmev  IV (intra-op)*   Induction:   PONV Risk Score and Plan: Propofol  infusion, Treatment may vary due to age or medical condition, Ondansetron  and Midazolam   Airway Management Planned: Natural Airway and Nasal Cannula  Additional Equipment: None  Intra-op Plan:   Post-operative Plan:   Informed Consent: I have reviewed the patients History and Physical, chart, labs and discussed the procedure including the risks, benefits and alternatives for the proposed anesthesia with the patient or authorized representative who has indicated his/her understanding and acceptance.     Dental advisory given  Plan Discussed with: CRNA and Surgeon  Anesthesia Plan Comments: (See PAT note from 12/29)         Anesthesia Quick Evaluation  "

## 2024-02-01 NOTE — H&P (Signed)
 TOTAL HIP ADMISSION H&P  Patient is admitted for left total hip arthroplasty.  Therapy Plans: HEP Disposition: Home with husband Planned DVT Prophylaxis: aspirin  81mg  BID DME needed: walker PCP: Dr. Trudy - clearance received Cardio: Dr. Richarda - clearance received TXA: IV Allergies: PCN - hives , codeine - cannot recall Anesthesia Concerns: none BMI: 33.8 Last HgbA1c: none   Other: - staying overnight - tylenol , celebrex, robaxin , tramadol  vs oxy - No hx of VTE or cancer - Her cousin and son have had hip replacement with Dr. Ernie (Darryl)    Subjective:  Chief Complaint: Left hip pain  HPI: Barbara [name], 79 y.o. female, has a history of pain and functional disability in the left hip due to arthritis and patient has failed non-surgical conservative treatments for greater than 12 weeks to include NSAID's and/or analgesics and activity modification. Onset of symptoms was gradual, starting 2 years ago with gradual worsening course since that time. The patient noted no prior surgeries on the left hip. Patient currently rates pain in the left hip at 8 out of 10 with activity. Patient has worsening of pain with activity and weight bearing and pain that interfers with activities of daily living. Patient has evidence of joint space narrowing by imaging studies. This condition presents safety issues increasing the risk of falls. There is no current active infection.  Patient Active Problem List   Diagnosis Date Noted   OSA (obstructive sleep apnea) 04/21/2023   Lumbar radiculitis 08/29/2022   Lumbar radiculopathy 08/29/2022   Upper airway cough syndrome vs cough variant asthma 08/29/2022   Essential hypertension 08/22/2022   Tenosynovitis of fingers 08/12/2022   Mixed hyperlipidemia 07/25/2022   DOE (dyspnea on exertion) 07/16/2022   Mild persistent asthma without complication 07/16/2022   Carpal tunnel syndrome of left wrist 04/23/2021   Trigger thumb of right hand 01/05/2020    Peroneal tendinitis 07/01/2017   Adhesive capsulitis of left shoulder 05/01/2017   Hx of colonic polyps 10/30/2010   GERD (gastroesophageal reflux disease) 10/30/2010   Constipation 10/30/2010    Past Medical History:  Diagnosis Date   Anxiety    Arthritis    Asthma    Bladder spasms    DDD (degenerative disc disease)    Depression    GERD (gastroesophageal reflux disease)    History of kidney stones    Hypercholesteremia    Hypertension    Sciatic nerve disease    Sleep apnea     Past Surgical History:  Procedure Laterality Date   ABDOMINAL HYSTERECTOMY     BREAST BIOPSY     precancer-left   CATARACT EXTRACTION W/PHACO Left 08/04/2016   Procedure: CATARACT EXTRACTION PHACO AND INTRAOCULAR LENS PLACEMENT (IOC);  Surgeon: [name], MD;  Location: [facility];  Service: Ophthalmology;  Laterality: Left;  CDE: 5.48   CATARACT EXTRACTION W/PHACO Right 08/18/2016   Procedure: CATARACT EXTRACTION PHACO AND INTRAOCULAR LENS PLACEMENT RIGHT EYE;  Surgeon: [name], MD;  Location: [facility];  Service: Ophthalmology;  Laterality: Right;  CDE: 8.30    COLONOSCOPY     12 years ago, Dr. Shaaron, polyp   complete hysterectomy  01/27/1998   fibroids   TONSILLECTOMY     TUBAL LIGATION     UPPER GI ENDOSCOPY      Prior to Admission medications  Medication Sig Start Date End Date Taking? Authorizing Provider  albuterol  (VENTOLIN  HFA) 108 (90 Base) MCG/ACT inhaler Inhale 2 puffs into the lungs every 4 (four) hours as needed. 10/01/23  Yes [name], NP  aspirin  EC 81 MG tablet Take 81 mg by mouth daily. Swallow whole.   Yes [provider]  atorvastatin  (LIPITOR) 80 MG tablet Take 120 mg by mouth daily.   Yes [provider]  fluticasone (FLOVENT HFA) 110 MCG/ACT inhaler Inhale 2 puffs into the lungs 2 (two) times daily as needed (Asthma).   Yes [provider]  furosemide  (LASIX ) 20 MG tablet Take 20 mg by mouth daily. 07/17/22  Yes [provider]  losartan  (COZAAR ) 25 MG tablet Take 25 mg by mouth daily. 07/17/22  Yes [provider]  MYRBETRIQ  50 MG TB24 tablet Take 50 mg by mouth daily.   Yes [provider]  polyethylene glycol (MIRALAX  / GLYCOLAX ) packet Take 17 g by mouth daily as needed for moderate constipation.   Yes [provider]  Venlafaxine  HCl 150 MG TB24 Take 150 mg by mouth daily.   Yes [provider]    Allergies[1]  Social History   Socioeconomic History   Marital status: Married    Spouse name: Not on file   Number of children: 3   Years of education: Not on file   Highest education level: Not on file  Occupational History   Occupation: retired    Comment: geologist, engineering  Tobacco Use   Smoking status: Never   Smokeless tobacco: Never  Vaping Use   Vaping status: Never Used  Substance and Sexual Activity   Alcohol use: No   Drug use: No   Sexual activity: Yes    Birth control/protection: Surgical  Other Topics Concern   Not on file  Social History Narrative   Not on file   Social Drivers of Health   Tobacco Use: Low Risk (01/26/2024)   Patient History    Smoking Tobacco Use: Never    Smokeless Tobacco Use: Never    Passive Exposure: Not on file  Financial Resource Strain: Low Risk (07/16/2022)   Received from [facility]   Overall Financial Resource Strain (CARDIA)    Difficulty of Paying Living Expenses: Not hard at all  Food Insecurity: No Food Insecurity (07/16/2022)   Received from [facility]   Epic    Within the past 12 months, you worried that your food would run out before you got the money to buy more.: Never true    Within the past 12 months, the food you bought just didn't last and you didn't have money to get more.: Never true  Transportation Needs: No Transportation Needs (07/16/2022)   Received from [facility]   PRAPARE - Transportation    Lack of Transportation (Medical): No    Lack of Transportation  (Non-Medical): No  Physical Activity: Insufficiently Active (07/16/2022)   Received from [facility]   Exercise Vital Sign    On average, how many days per week do you engage in moderate to strenuous exercise (like a brisk walk)?: 2 days    On average, how many minutes do you engage in exercise at this level?: 20 min  Stress: No Stress Concern Present (07/16/2022)   Received from [facility] - Occupational Stress Questionnaire    Feeling of Stress : Only a little  Social Connections: Socially Integrated (07/16/2022)   Received from [facility]   Social Connection and Isolation Panel    In a typical week, how many times do you talk on the phone with family,  friends, or neighbors?: More than three times a week    How often do you get together with friends or relatives?: More than three times a week    How often do you attend church or religious services?: More than 4 times per year    Do you belong to any clubs or organizations such as church groups, unions, fraternal or athletic groups, or school groups?: Yes    How often do you attend meetings of the clubs or organizations you belong to?: 1 to 4 times per year    Are you married, widowed, divorced, separated, never married, or living with a partner?: Married  Intimate Partner Violence: Not At Risk (07/16/2022)   Received from [facility]   Epic    Within the last year, have you been afraid of your partner or ex-partner?: No    Within the last year, have you been humiliated or emotionally abused in other ways by your partner or ex-partner?: No    Within the last year, have you been kicked, hit, slapped, or otherwise physically hurt by your partner or ex-partner?: No    Within the last year, have you been raped or forced to have any kind of sexual activity by your partner or ex-partner?: No  Depression (PHQ2-9): Not on file  Alcohol Screen: Not on file  Housing: Not on file  Utilities:  Low Risk (07/16/2022)   Received from [facility]   Utilities    Within the past 12 months, have you been unable to get utilities(heat, electricity) when it was really needed?: No  Health Literacy: Medium Risk (07/16/2022)   Received from [facility] Literacy    How often do you need to have someone help you when you read instructions, pamphlets, or other written material from your doctor or pharmacy?: Rarely    Tobacco Use: Low Risk (01/26/2024)   Patient History    Smoking Tobacco Use: Never    Smokeless Tobacco Use: Never    Passive Exposure: Not on file   Social History   Substance and Sexual Activity  Alcohol Use No    Family History  Problem Relation Age of Onset   Colon cancer Maternal Grandmother    Breast cancer Mother    Breast cancer Maternal Aunt    Heart disease Other        father, brother   Lung cancer Maternal Grandfather    Inflammatory bowel disease Son        ?crohn's, followed at [facility] on Remicade   GI problems Brother        Bowel perforation   Ovarian cancer Neg Hx    Anesthesia problems Neg Hx    Hypotension Neg Hx    Malignant hyperthermia Neg Hx    Pseudochol deficiency Neg Hx     Review Of Systems: Constitutional: Constitutional: no fever, chills, night sweats, or significant weight loss. Cardiovascular: Cardiovascular: no palpitations or chest pain. Respiratory: Respiratory: no cough or shortness of breath and No COPD. Gastrointestinal: Gastrointestinal: no vomiting or nausea. Musculoskeletal: Musculoskeletal: Joint Pain and swelling in Joints. Neurologic: Neurologic: no numbness, tingling, or difficulty with balance.   Objective:  Physical Exam: Left hip exam: Painful and limited hip flexion internal rotation to 5 degrees with pelvic tilting, external rotation over 20 degrees slight external rotation contracture with active hip flexion Right hip exam reveals limited hip flexion internal rotation to 10 degrees with mild  discomfort, external rotation closer to 30 degrees No  significant lower extremity edema, erythema or calf tenderness  Vital signs in last 24 hours:    Imaging Review Plain radiographs demonstrate severe degenerative joint disease of the left hip. The bone quality appears to be adequate for age and reported activity level.  Assessment/Plan:  End stage arthritis, left hip  The patient history, physical examination, clinical judgement of the provider and imaging studies are consistent with end stage degenerative joint disease of the left hip and total hip arthroplasty is deemed medically necessary. The treatment options including medical management, injection therapy, arthroscopy and arthroplasty were discussed at length. The risks and benefits of total hip arthroplasty were presented and reviewed. The risks due to aseptic loosening, infection, stiffness, dislocation/subluxation, thromboembolic complications and other imponderables were discussed. The patient acknowledged the explanation, agreed to proceed with the plan and consent was signed. Patient is being admitted for inpatient treatment for surgery, pain control, PT, OT, prophylactic antibiotics, VTE prophylaxis, progressive ambulation and ADLs and discharge planning.The patient is planning to be discharged home.  Rosina Calin, PA-C Orthopedic Surgery [facility] [place] [phone]      [1]  Allergies Allergen Reactions   Bee Venom Swelling   Codeine     Chest pain   Penicillins Rash

## 2024-02-02 ENCOUNTER — Observation Stay (HOSPITAL_COMMUNITY)
Admission: RE | Admit: 2024-02-02 | Discharge: 2024-02-03 | Disposition: A | Discharge status: Home / Self Care | Source: Ambulatory Visit | Attending: Orthopedic Surgery | Admitting: Orthopedic Surgery

## 2024-02-02 ENCOUNTER — Encounter (HOSPITAL_COMMUNITY): Admission: RE | Payer: Self-pay | Source: Ambulatory Visit

## 2024-02-02 ENCOUNTER — Observation Stay (HOSPITAL_COMMUNITY)

## 2024-02-02 ENCOUNTER — Ambulatory Visit (HOSPITAL_COMMUNITY): Admitting: Anesthesiology

## 2024-02-02 ENCOUNTER — Other Ambulatory Visit: Payer: Self-pay

## 2024-02-02 ENCOUNTER — Encounter (HOSPITAL_COMMUNITY): Payer: Self-pay | Admitting: Orthopedic Surgery

## 2024-02-02 ENCOUNTER — Encounter (HOSPITAL_COMMUNITY): Payer: Self-pay | Admitting: Medical

## 2024-02-02 ENCOUNTER — Ambulatory Visit (HOSPITAL_COMMUNITY)

## 2024-02-02 ENCOUNTER — Ambulatory Visit: Admitting: Nurse Practitioner

## 2024-02-02 DIAGNOSIS — I10 Essential (primary) hypertension: Secondary | ICD-10-CM | POA: Diagnosis not present

## 2024-02-02 DIAGNOSIS — Z79899 Other long term (current) drug therapy: Secondary | ICD-10-CM | POA: Diagnosis not present

## 2024-02-02 DIAGNOSIS — Z96642 Presence of left artificial hip joint: Secondary | ICD-10-CM

## 2024-02-02 DIAGNOSIS — J45909 Unspecified asthma, uncomplicated: Secondary | ICD-10-CM | POA: Insufficient documentation

## 2024-02-02 DIAGNOSIS — M1612 Unilateral primary osteoarthritis, left hip: Principal | ICD-10-CM | POA: Insufficient documentation

## 2024-02-02 DIAGNOSIS — M25552 Pain in left hip: Secondary | ICD-10-CM | POA: Diagnosis present

## 2024-02-02 HISTORY — PX: TOTAL HIP ARTHROPLASTY: SHX124

## 2024-02-02 LAB — TYPE AND SCREEN
ABO/RH(D): A POS
Antibody Screen: NEGATIVE

## 2024-02-02 LAB — ABO/RH: ABO/RH(D): A POS

## 2024-02-02 MED ORDER — ONDANSETRON HCL 4 MG/2ML IJ SOLN: 4.0000 mg | Freq: Four times a day (QID) | INTRAMUSCULAR | Status: DC | PRN

## 2024-02-02 MED ORDER — MIRABEGRON ER 25 MG PO TB24
50.0000 mg | ORAL_TABLET | Freq: Every day | ORAL | Status: DC
  Administered 2024-02-03: 50 mg via ORAL
  Filled 2024-02-02: qty 2

## 2024-02-02 MED ORDER — HYDROMORPHONE HCL 1 MG/ML IJ SOLN: 0.5000 mg | INTRAMUSCULAR | Status: DC | PRN

## 2024-02-02 MED ORDER — ONDANSETRON HCL 4 MG PO TABS: 4.0000 mg | ORAL_TABLET | Freq: Four times a day (QID) | ORAL | Status: DC | PRN

## 2024-02-02 MED ORDER — PROPOFOL 10 MG/ML IV BOLUS
INTRAVENOUS | Status: AC
  Filled 2024-02-02: qty 20

## 2024-02-02 MED ORDER — STERILE WATER FOR IRRIGATION IR SOLN
Status: DC | PRN
  Administered 2024-02-02: 1000 mL

## 2024-02-02 MED ORDER — ALBUTEROL SULFATE (2.5 MG/3ML) 0.083% IN NEBU: 2.5000 mg | INHALATION_SOLUTION | RESPIRATORY_TRACT | Status: DC | PRN

## 2024-02-02 MED ORDER — METOCLOPRAMIDE HCL 5 MG/ML IJ SOLN: 5.0000 mg | Freq: Three times a day (TID) | INTRAMUSCULAR | Status: DC | PRN

## 2024-02-02 MED ORDER — LOSARTAN POTASSIUM 25 MG PO TABS
25.0000 mg | ORAL_TABLET | Freq: Every day | ORAL | Status: DC
  Administered 2024-02-03: 25 mg via ORAL
  Filled 2024-02-02: qty 1

## 2024-02-02 MED ORDER — POLYETHYLENE GLYCOL 3350 17 G PO PACK
17.0000 g | PACK | Freq: Two times a day (BID) | ORAL | Status: DC
  Administered 2024-02-02 – 2024-02-03 (×2): 17 g via ORAL
  Filled 2024-02-02 (×2): qty 1

## 2024-02-02 MED ORDER — BISACODYL 10 MG RE SUPP: 10.0000 mg | Freq: Every day | RECTAL | Status: DC | PRN

## 2024-02-02 MED ORDER — FENTANYL CITRATE (PF) 100 MCG/2ML IJ SOLN
INTRAMUSCULAR | Status: DC | PRN
  Administered 2024-02-02 (×2): 50 ug via INTRAVENOUS

## 2024-02-02 MED ORDER — DEXAMETHASONE SOD PHOSPHATE PF 10 MG/ML IJ SOLN
10.0000 mg | Freq: Once | INTRAMUSCULAR | Status: AC
  Administered 2024-02-03: 10 mg via INTRAVENOUS
  Filled 2024-02-02: qty 1

## 2024-02-02 MED ORDER — MEPIVACAINE HCL (PF) 2 % IJ SOLN
INTRAMUSCULAR | Status: DC | PRN
  Administered 2024-02-02: 60 mg via INTRATHECAL

## 2024-02-02 MED ORDER — PHENYLEPHRINE HCL (PRESSORS) 10 MG/ML IV SOLN
INTRAVENOUS | Status: AC
  Filled 2024-02-02: qty 1

## 2024-02-02 MED ORDER — ALUM & MAG HYDROXIDE-SIMETH 200-200-20 MG/5ML PO SUSP: 30.0000 mL | ORAL | Status: DC | PRN

## 2024-02-02 MED ORDER — CEFAZOLIN SODIUM-DEXTROSE 2-4 GM/100ML-% IV SOLN
2.0000 g | Freq: Four times a day (QID) | INTRAVENOUS | Status: AC
  Administered 2024-02-02 – 2024-02-03 (×2): 2 g via INTRAVENOUS
  Filled 2024-02-02 (×2): qty 100

## 2024-02-02 MED ORDER — METHOCARBAMOL 1000 MG/10ML IJ SOLN: 500.0000 mg | Freq: Four times a day (QID) | INTRAMUSCULAR | Status: DC | PRN

## 2024-02-02 MED ORDER — ACETAMINOPHEN 10 MG/ML IV SOLN: 1000.0000 mg | Freq: Once | INTRAVENOUS | Status: DC | PRN

## 2024-02-02 MED ORDER — PHENYLEPHRINE HCL-NACL 20-0.9 MG/250ML-% IV SOLN
INTRAVENOUS | Status: DC | PRN
  Administered 2024-02-02: 50 ug/min via INTRAVENOUS

## 2024-02-02 MED ORDER — ONDANSETRON HCL 4 MG/2ML IJ SOLN
INTRAMUSCULAR | Status: AC
  Filled 2024-02-02: qty 2

## 2024-02-02 MED ORDER — TRANEXAMIC ACID-NACL 1000-0.7 MG/100ML-% IV SOLN
1000.0000 mg | INTRAVENOUS | Status: DC
  Filled 2024-02-02: qty 100

## 2024-02-02 MED ORDER — MENTHOL 3 MG MT LOZG: 1.0000 | LOZENGE | OROMUCOSAL | Status: DC | PRN

## 2024-02-02 MED ORDER — DEXAMETHASONE SOD PHOSPHATE PF 10 MG/ML IJ SOLN: 8.0000 mg | Freq: Once | INTRAMUSCULAR | Status: DC

## 2024-02-02 MED ORDER — ACETAMINOPHEN 500 MG PO TABS
1000.0000 mg | ORAL_TABLET | Freq: Four times a day (QID) | ORAL | Status: DC
  Administered 2024-02-02 – 2024-02-03 (×4): 1000 mg via ORAL
  Filled 2024-02-02 (×4): qty 2

## 2024-02-02 MED ORDER — TRAMADOL HCL 50 MG PO TABS
50.0000 mg | ORAL_TABLET | Freq: Four times a day (QID) | ORAL | Status: DC | PRN
  Administered 2024-02-03: 100 mg via ORAL
  Filled 2024-02-02: qty 2

## 2024-02-02 MED ORDER — TRANEXAMIC ACID-NACL 1000-0.7 MG/100ML-% IV SOLN
INTRAVENOUS | Status: DC | PRN
  Administered 2024-02-02: 1000 mg via INTRAVENOUS

## 2024-02-02 MED ORDER — SODIUM CHLORIDE (PF) 0.9 % IJ SOLN
INTRAMUSCULAR | Status: AC
  Filled 2024-02-02: qty 30

## 2024-02-02 MED ORDER — POVIDONE-IODINE 10 % EX SWAB: 2.0000 | Freq: Once | CUTANEOUS | Status: DC

## 2024-02-02 MED ORDER — BUPIVACAINE-EPINEPHRINE (PF) 0.25% -1:200000 IJ SOLN
INTRAMUSCULAR | Status: AC
  Filled 2024-02-02: qty 30

## 2024-02-02 MED ORDER — DIPHENHYDRAMINE HCL 12.5 MG/5ML PO ELIX: 12.5000 mg | ORAL_SOLUTION | ORAL | Status: DC | PRN

## 2024-02-02 MED ORDER — FENTANYL CITRATE (PF) 50 MCG/ML IJ SOSY
25.0000 ug | PREFILLED_SYRINGE | INTRAMUSCULAR | Status: DC | PRN
  Administered 2024-02-02 (×2): 50 ug via INTRAVENOUS

## 2024-02-02 MED ORDER — FUROSEMIDE 20 MG PO TABS
20.0000 mg | ORAL_TABLET | Freq: Every day | ORAL | Status: DC
  Administered 2024-02-03: 20 mg via ORAL
  Filled 2024-02-02: qty 1

## 2024-02-02 MED ORDER — METOCLOPRAMIDE HCL 5 MG PO TABS: 5.0000 mg | ORAL_TABLET | Freq: Three times a day (TID) | ORAL | Status: DC | PRN

## 2024-02-02 MED ORDER — SODIUM CHLORIDE (PF) 0.9 % IJ SOLN
INTRAMUSCULAR | Status: DC | PRN
  Administered 2024-02-02: 61 mL

## 2024-02-02 MED ORDER — LACTATED RINGERS IV SOLN: INTRAVENOUS | Status: DC | PRN

## 2024-02-02 MED ORDER — LIDOCAINE HCL (PF) 2 % IJ SOLN
INTRAMUSCULAR | Status: AC
  Filled 2024-02-02: qty 5

## 2024-02-02 MED ORDER — 0.9 % SODIUM CHLORIDE (POUR BTL) OPTIME
TOPICAL | Status: DC | PRN
  Administered 2024-02-02: 1000 mL

## 2024-02-02 MED ORDER — OXYCODONE HCL 5 MG PO TABS
5.0000 mg | ORAL_TABLET | ORAL | Status: DC | PRN
  Administered 2024-02-02: 5 mg via ORAL
  Filled 2024-02-02: qty 1

## 2024-02-02 MED ORDER — ONDANSETRON HCL 4 MG/2ML IJ SOLN
INTRAMUSCULAR | Status: DC | PRN
  Administered 2024-02-02: 4 mg via INTRAVENOUS

## 2024-02-02 MED ORDER — CEFAZOLIN SODIUM-DEXTROSE 2-4 GM/100ML-% IV SOLN
2.0000 g | INTRAVENOUS | Status: AC
  Administered 2024-02-02: 2 g via INTRAVENOUS
  Filled 2024-02-02: qty 100

## 2024-02-02 MED ORDER — SODIUM CHLORIDE 0.9 % IV SOLN: INTRAVENOUS | Status: DC

## 2024-02-02 MED ORDER — VENLAFAXINE HCL ER 150 MG PO CP24
150.0000 mg | ORAL_CAPSULE | Freq: Every day | ORAL | Status: DC
  Administered 2024-02-03: 150 mg via ORAL
  Filled 2024-02-02: qty 1

## 2024-02-02 MED ORDER — PHENYLEPHRINE 80 MCG/ML (10ML) SYRINGE FOR IV PUSH (FOR BLOOD PRESSURE SUPPORT)
PREFILLED_SYRINGE | INTRAVENOUS | Status: AC
  Filled 2024-02-02: qty 10

## 2024-02-02 MED ORDER — ASPIRIN 81 MG PO CHEW
81.0000 mg | CHEWABLE_TABLET | Freq: Two times a day (BID) | ORAL | Status: DC
  Administered 2024-02-02 – 2024-02-03 (×2): 81 mg via ORAL
  Filled 2024-02-02 (×2): qty 1

## 2024-02-02 MED ORDER — ALBUMIN HUMAN 5 % IV SOLN
INTRAVENOUS | Status: AC
  Filled 2024-02-02: qty 250

## 2024-02-02 MED ORDER — PHENOL 1.4 % MT LIQD: 1.0000 | OROMUCOSAL | Status: DC | PRN

## 2024-02-02 MED ORDER — ALBUMIN HUMAN 5 % IV SOLN: INTRAVENOUS | Status: DC | PRN

## 2024-02-02 MED ORDER — PROPOFOL 10 MG/ML IV BOLUS
INTRAVENOUS | Status: DC | PRN
  Administered 2024-02-02: 20 mg via INTRAVENOUS
  Administered 2024-02-02: 85 ug/kg/min via INTRAVENOUS
  Administered 2024-02-02 (×2): 20 mg via INTRAVENOUS

## 2024-02-02 MED ORDER — FENTANYL CITRATE (PF) 50 MCG/ML IJ SOSY
PREFILLED_SYRINGE | INTRAMUSCULAR | Status: AC
  Filled 2024-02-02: qty 3

## 2024-02-02 MED ORDER — METHOCARBAMOL 500 MG PO TABS
500.0000 mg | ORAL_TABLET | Freq: Four times a day (QID) | ORAL | Status: DC | PRN
  Administered 2024-02-02 – 2024-02-03 (×2): 500 mg via ORAL
  Filled 2024-02-02 (×2): qty 1

## 2024-02-02 MED ORDER — ALBUTEROL SULFATE HFA 108 (90 BASE) MCG/ACT IN AERS: 2.0000 | INHALATION_SPRAY | RESPIRATORY_TRACT | Status: DC | PRN

## 2024-02-02 MED ORDER — ORAL CARE MOUTH RINSE: 15.0000 mL | Freq: Once | OROMUCOSAL | Status: AC

## 2024-02-02 MED ORDER — LACTATED RINGERS IV SOLN: INTRAVENOUS | Status: DC

## 2024-02-02 MED ORDER — ONDANSETRON HCL 4 MG/2ML IJ SOLN: 4.0000 mg | Freq: Once | INTRAMUSCULAR | Status: DC | PRN

## 2024-02-02 MED ORDER — TRANEXAMIC ACID-NACL 1000-0.7 MG/100ML-% IV SOLN
1000.0000 mg | Freq: Once | INTRAVENOUS | Status: AC
  Administered 2024-02-02: 1000 mg via INTRAVENOUS
  Filled 2024-02-02: qty 100

## 2024-02-02 MED ORDER — DEXAMETHASONE SOD PHOSPHATE PF 10 MG/ML IJ SOLN
INTRAMUSCULAR | Status: DC | PRN
  Administered 2024-02-02: 10 mg via INTRAVENOUS

## 2024-02-02 MED ORDER — SENNA 8.6 MG PO TABS
2.0000 | ORAL_TABLET | Freq: Every day | ORAL | Status: DC
  Administered 2024-02-02: 17.2 mg via ORAL
  Filled 2024-02-02: qty 2

## 2024-02-02 MED ORDER — CHLORHEXIDINE GLUCONATE 0.12 % MT SOLN
15.0000 mL | Freq: Once | OROMUCOSAL | Status: AC
  Administered 2024-02-02: 15 mL via OROMUCOSAL

## 2024-02-02 MED ORDER — FENTANYL CITRATE (PF) 100 MCG/2ML IJ SOLN
INTRAMUSCULAR | Status: AC
  Filled 2024-02-02: qty 2

## 2024-02-02 MED ORDER — KETOROLAC TROMETHAMINE 30 MG/ML IJ SOLN
INTRAMUSCULAR | Status: AC
  Filled 2024-02-02: qty 1

## 2024-02-02 MED ORDER — PROPOFOL 1000 MG/100ML IV EMUL
INTRAVENOUS | Status: AC
  Filled 2024-02-02: qty 100

## 2024-02-02 MED ORDER — ATORVASTATIN CALCIUM 40 MG PO TABS
120.0000 mg | ORAL_TABLET | Freq: Every day | ORAL | Status: DC
  Administered 2024-02-03: 120 mg via ORAL
  Filled 2024-02-02: qty 3

## 2024-02-02 NOTE — Transfer of Care (Signed)
 Immediate Anesthesia Transfer of Care Note  Patient: Barbara [name]  Procedure(s) Performed: ARTHROPLASTY, HIP, TOTAL, ANTERIOR APPROACH (Left: Hip)  Patient Location: PACU  Anesthesia Type:MAC and Spinal  Level of Consciousness: sedated and responds to stimulation  Airway & Oxygen Therapy: Patient Spontanous Breathing and Patient connected to face mask oxygen  Post-op Assessment: Report given to RN and Post -op Vital signs reviewed and unstable, Anesthesiologist notified  Post vital signs: Reviewed and stable  Last Vitals:  Vitals Value Taken Time  BP 106/52 02/02/24 15:12  Temp    Pulse 62 02/02/24 15:15  Resp 11 02/02/24 15:15  SpO2 100 % 02/02/24 15:15  Vitals shown include unfiled device data.  Last Pain:  Vitals:   02/02/24 1108  TempSrc: Oral  PainSc: 0-No pain      Patients Stated Pain Goal: 4 (02/02/24 1108)  Complications: No notable events documented.

## 2024-02-02 NOTE — Anesthesia Procedure Notes (Signed)
 Procedure Name: MAC Date/Time: 02/02/2024 1:40 PM  Performed by: Obadiah Reyes BROCKS, CRNAPre-anesthesia Checklist: Patient identified, Emergency Drugs available, Suction available, Patient being monitored and Timeout performed Patient Re-evaluated:Patient Re-evaluated prior to induction Oxygen Delivery Method: Simple face mask Preoxygenation: Pre-oxygenation with 100% oxygen Airway Equipment and Method: Oral airway

## 2024-02-02 NOTE — Anesthesia Postprocedure Evaluation (Signed)
"   Anesthesia Post Note  Patient: Barbara [name]  Procedure(s) Performed: ARTHROPLASTY, HIP, TOTAL, ANTERIOR APPROACH (Left: Hip)     Patient location during evaluation: Nursing Unit Anesthesia Type: Spinal Level of consciousness: oriented and awake and alert Pain management: pain level controlled Vital Signs Assessment: post-procedure vital signs reviewed and stable Respiratory status: spontaneous breathing and respiratory function stable Cardiovascular status: blood pressure returned to baseline and stable Postop Assessment: no headache, no backache, no apparent nausea or vomiting and patient able to bend at knees Anesthetic complications: no   No notable events documented.  Last Vitals:  Vitals:   02/02/24 1700 02/02/24 1705  BP: 122/66 135/65  Pulse: 69 64  Resp: 13 16  Temp: 36.4 C (!) 36.4 C  SpO2: 96% 100%    Last Pain:  Vitals:   02/02/24 1705  TempSrc: Oral  PainSc:                  Garnette [name]      "

## 2024-02-02 NOTE — Interval H&P Note (Signed)
 History and Physical Interval Note:  02/02/2024 12:13 PM  Barbara [name]  has presented today for surgery, with the diagnosis of Left hip osteoarthritis.  The various methods of treatment have been discussed with the patient and family. After consideration of risks, benefits and other options for treatment, the patient has consented to  Procedures: ARTHROPLASTY, HIP, TOTAL, ANTERIOR APPROACH (Left) as a surgical intervention.  The patient's history has been reviewed, patient examined, no change in status, stable for surgery.  I have reviewed the patient's chart and labs.  Questions were answered to the patient's satisfaction.     Donnice JONETTA Car

## 2024-02-02 NOTE — Evaluation (Signed)
 Physical Therapy Evaluation Patient Details Name: Barbara [name] MRN: [id number] DOB: Dec 07, 1945 Today's Date: 02/02/2024  History of Present Illness  79 yo female presents to therapy s/p L THA, anterior approach on 02/02/2024 due to failure of conservative measures. Pt PMH includes but is not limited to: OSA, lumbar radiculopathy with sciatica, HTN, HLD, DOE, asthma, L wrist carpal tunnel syndrome, GERD, and anxiety.  Clinical Impression    Barbara [name] is a 79 y.o. female POD 0 s/p L THA. Patient reports IND with mobility at baseline. Patient is now limited by functional impairments (see PT problem list below) and requires CGA for bed mobility and CGA for transfers. Patient was able to ambulate 20 feet with RW and CGA level of assist. Patient instructed in exercise to facilitate ROM and circulation to manage edema. Patient will benefit from continued skilled PT interventions to address impairments and progress towards PLOF. Acute PT will follow to progress mobility and stair training in preparation for safe discharge home with family support and HEP.       If plan is discharge home, recommend the following: A little help with walking and/or transfers;A little help with bathing/dressing/bathroom;Assistance with cooking/housework;Help with stairs or ramp for entrance;Assist for transportation   Can travel by private vehicle        Equipment Recommendations Rolling walker (2 wheels) (youth?)  Recommendations for Other Services       Functional Status Assessment Patient has had a recent decline in their functional status and demonstrates the ability to make significant improvements in function in a reasonable and predictable amount of time.     Precautions / Restrictions Precautions Precautions: Fall Restrictions Weight Bearing Restrictions Per Provider Order: No      Mobility  Bed Mobility Overal bed mobility: Independent, Needs Assistance Bed Mobility: Supine to Sit      Supine to sit: Contact guard, HOB elevated     General bed mobility comments: cues    Transfers Overall transfer level: Needs assistance Equipment used: Rolling walker (2 wheels) Transfers: Sit to/from Stand Sit to Stand: Contact guard assist           General transfer comment: min cues and increased time for power up    Ambulation/Gait Ambulation/Gait assistance: Contact guard assist Gait Distance (Feet): 20 Feet Assistive device: Rolling walker (2 wheels) Gait Pattern/deviations: Step-to pattern, Decreased stance time - left, Antalgic, Trunk flexed Gait velocity: decreased     General Gait Details: slight trunk flexion with B UE support at RW to offload L LE in stance phase, pt may benefit from youth height Rw, min cues for RW management  Stairs            Wheelchair Mobility     Tilt Bed    Modified Rankin (Stroke Patients Only)       Balance Overall balance assessment: Needs assistance Sitting-balance support: Feet supported Sitting balance-Leahy Scale: Good     Standing balance support: Bilateral upper extremity supported, During functional activity, Reliant on assistive device for balance Standing balance-Leahy Scale: Poor                               Pertinent Vitals/Pain Pain Assessment Pain Assessment: 0-10 Pain Score: 8  (2/10 pain at rest and increased with mobiltiy) Pain Location: L hip and LE (back and R eye) Pain Descriptors / Indicators: Aching, Constant, Discomfort, Grimacing, Operative site guarding Pain Intervention(s): Limited activity within patient's tolerance, Monitored  during session, Premedicated before session, Repositioned, Ice applied    Home Living Family/patient expects to be discharged to:: Private residence Living Arrangements: Spouse/significant other Available Help at Discharge: Family Type of Home: House Home Access: Stairs to enter Entrance Stairs-Rails: None Entrance Stairs-Number of Steps: 3 (5  front and handrail)   Home Layout: Two level;Able to live on main level with bedroom/bathroom Home Equipment: [place] - single point;BSC/3in1;Shower seat      Prior Function Prior Level of Function : Independent/Modified Independent             Mobility Comments: IND no AD for all ADLs, self care tasks and IADLs       Extremity/Trunk Assessment        Lower Extremity Assessment Lower Extremity Assessment: LLE deficits/detail LLE Deficits / Details: ankle DF/PF 5/5 LLE Sensation: WNL    Cervical / Trunk Assessment Cervical / Trunk Assessment: Normal  Communication   Communication Communication: No apparent difficulties    Cognition Arousal: Alert Behavior During Therapy: WFL for tasks assessed/performed   PT - Cognitive impairments: No apparent impairments                         Following commands: Intact       Cueing       General Comments      Exercises     Assessment/Plan    PT Assessment Patient needs continued PT services  PT Problem List Decreased strength;Decreased range of motion;Decreased activity tolerance;Decreased balance;Decreased mobility;Decreased coordination;Pain       PT Treatment Interventions DME instruction;Gait training;Stair training;Functional mobility training;Therapeutic activities;Balance training;Neuromuscular re-education;Therapeutic exercise;Patient/family education;Modalities    PT Goals (Current goals can be found in the Care Plan section)  Acute Rehab PT Goals Patient Stated Goal: to be able to get stronger and do what I want to no pain PT Goal Formulation: With patient Time For Goal Achievement: 02/16/24 Potential to Achieve Goals: Good    Frequency 7X/week     Co-evaluation               AM-PAC PT 6 Clicks Mobility  Outcome Measure Help needed turning from your back to your side while in a flat bed without using bedrails?: None Help needed moving from lying on your back to sitting on the  side of a flat bed without using bedrails?: A Little Help needed moving to and from a bed to a chair (including a wheelchair)?: A Little Help needed standing up from a chair using your arms (e.g., wheelchair or bedside chair)?: A Little Help needed to walk in hospital room?: A Little Help needed climbing 3-5 steps with a railing? : A Lot 6 Click Score: 18    End of Session Equipment Utilized During Treatment: Gait belt Activity Tolerance: Other (comment);Patient limited by pain;Patient limited by fatigue (nausea) Patient left: in chair;with call bell/phone within reach;with family/visitor present Nurse Communication: Mobility status;Other (comment) (pain, nasuea and scratchy R eye) PT Visit Diagnosis: Unsteadiness on feet (R26.81);Other abnormalities of gait and mobility (R26.89);Muscle weakness (generalized) (M62.81);Difficulty in walking, not elsewhere classified (R26.2);Pain Pain - Right/Left: Left Pain - part of body: Leg;Hip    Time: 8154-8090 PT Time Calculation (min) (ACUTE ONLY): 24 min   Charges:   PT Evaluation $PT Eval Low Complexity: 1 Low PT Treatments $Therapeutic Activity: 8-22 mins PT General Charges $$ ACUTE PT VISIT: 1 Visit         [place], PT Acute Rehab   [place] VEAR Drone  02/02/2024, 7:37 PM

## 2024-02-02 NOTE — Op Note (Signed)
 MEDICAL RECORD NO.: [id number]      FACILITY:  [facility]      PHYSICIAN:  Barbara JONETTA [name]  DATE OF BIRTH:  1945-04-07     DATE OF PROCEDURE:  02/02/2024                                 OPERATIVE REPORT         PREOPERATIVE DIAGNOSIS: left hip osteoarthritis.      POSTOPERATIVE DIAGNOSIS:  left hip osteoarthritis.      PROCEDURE:  left total hip replacement through an anterior approach   utilizing DePuy THR system, component size 52 mm pinnacle cup, a size 36+4 neutral   Altrex liner, a size 4 Hi Actis stem with a 36+5 delta ceramic   ball.      SURGEON:  Barbara [name], M.D.      ASSISTANT:  Rosina Calin, PA-C     ANESTHESIA:  Spinal.      SPECIMENS:  None.      COMPLICATIONS:  None.      BLOOD LOSS:  800 cc     DRAINS:  None.      INDICATION OF THE PROCEDURE:  Barbara [name] is a 79 y.o. female who had   presented to office for evaluation of {left hip pain.  Radiographs revealed   progressive degenerative changes with bone-on-bone   articulation of the  hip joint, including subchondral cystic changes and osteophytes.  The patient had painful limited range of   motion significantly affecting their overall quality of life and function.  The patient was failing to    respond to conservative measures including medications and/or injections and activity modification and at this point was ready   to proceed with more definitive measures.  Consent was obtained for   benefit of pain relief.  Specific risks of infection, DVT, component   failure, dislocation, neurovascular injury, and need for revision surgery were reviewed in the office.     PROCEDURE IN DETAIL:  The patient was brought to operative theater.   Once adequate anesthesia, preoperative antibiotics, 2 gm of Ancef , 1 gm of Tranexamic Acid , and 10 mg of Decadron  were administered, the patient was positioned supine on the [employer] table.  Once the patient was safely positioned with adequate padding  and protection of bony prominences we predraped out the hip, and used fluoroscopy to confirm orientation of the pelvis.      The left hip was then prepped and draped from proximal iliac crest to   mid thigh with a shower curtain technique.      A time-out was performed identifying the patient, planned procedure, and the appropriate extremity.     An incision was then made 2 cm lateral to the   anterior superior iliac spine extending over the orientation of the   tensor fascia lata muscle and sharp dissection was carried down to the   fascia of the muscle.      The fascia was then incised.  The muscle belly was identified and swept   laterally and retractor placed along the superior neck.  Following   cauterization of the circumflex vessels and removing some pericapsular   fat, a second cobra retractor was placed on the inferior neck.  A T-capsulotomy was made along the line of the   superior neck to the trochanteric fossa, then extended proximally and   distally.  Tag sutures were placed and the retractors were then placed   intracapsular.  We then identified the trochanteric fossa and   orientation of my neck cut and then made a neck osteotomy with the femur on traction.  The femoral   head was removed without difficulty or complication.  Traction was let   off and retractors were placed posterior and anterior around the   acetabulum.      The labrum and foveal tissue were debrided.  I began reaming with a 45 mm   reamer and reamed up to 51 mm reamer with good bony bed preparation and a 52 mm  cup was chosen.  The final 52 mm Pinnacle cup was then impacted under fluoroscopy to confirm the depth of penetration and orientation with respect to   Abduction and forward flexion.  A screw was placed into the ilium.  The final   36+4 neutral Altrex liner was impacted with good visualized rim fit.  The cup was positioned anatomically within the acetabular portion of the pelvis.      At this  point, the femur was rolled to 100 degrees.  Further capsule was   released off the inferior aspect of the femoral neck.  I then   released the superior capsule proximally.  With the leg in a neutral position the hook was placed laterally   along the femur under the vastus lateralis origin and elevated manually and then held in position using the hook attachment on the bed.  The leg was then extended and adducted with the leg rolled to 100   degrees of external rotation with traction off.  Retractors were placed along the medial calcar and posteriorly over the greater trochanter.  Once the proximal femur was fully   exposed, I used a box osteotome to set orientation.  I then began   broaching with the starting chili pepper broach and passed this by hand and then broached up to 4.  With the 4 broach in place I chose a high offset neck and did several trial reductions.  The offset was appropriate, leg lengths   appeared to be equal best matched with the +5 head ball trial confirmed radiographically.   Given these findings, I went ahead and dislocated the hip, repositioned all   retractors and positioned the left hip in the extended and abducted position.  The final 4 Hi Actis stem was   chosen and it was impacted down to the level of neck cut.  Based on this   and the trial reductions, a final 36+5 delta ceramic ball was chosen and   impacted onto a clean and dry trunnion, and the hip was reduced.  The   hip had been irrigated throughout the case again at this point.  I did   reapproximate the superior capsular leaflet to the anterior leaflet   using #1 Vicryl.  The fascia of the   tensor fascia lata muscle was then reapproximated using #1 Vicryl and #0 Stratafix sutures.  The   remaining wound was closed with 2-0 Vicryl and running 4-0 Monocryl.   The hip was cleaned, dried, and dressed sterilely using Dermabond and   Aquacel dressing.  The patient was then brought   to recovery room in stable  condition tolerating the procedure well.    PA Patti was present for the entirety of the case involved from   preoperative positioning, perioperative retractor management, general   facilitation of the case, as well as primary  wound closure as assistant.            Barbara CORDOBA Ernie, M.D.        02/02/2024 2:49 PM

## 2024-02-02 NOTE — Care Plan (Signed)
 Ortho Bundle Case Management Note  Patient Details  Name: Barbara [name] MRN: [id number] Date of Birth: [birth year]  L THA on 02/02/24  DCP: Home with husband   DME: RW ordered through [employer]  PT: HEP                   DME Arranged:  Walker rolling DME Agency:  [employer]  HH Arranged:    HH Agency:     Additional Comments: Please contact me with any questions of if this plan should need to change.  Lyle Pepper, CCM [facility]  [phone]  Ext. [phone]   02/02/2024, 1:35 PM

## 2024-02-02 NOTE — Anesthesia Procedure Notes (Signed)
 Spinal  Patient location during procedure: OR End time: 02/02/2024 1:40 PM Reason for block: surgical anesthesia  Staffing Performed: anesthesiologist  Authorized by: Jefm Garnette LABOR, MD   Performed by: Jefm Garnette LABOR, MD  Preanesthetic Checklist Completed: patient identified, IV checked, risks and benefits discussed, surgical consent, monitors and equipment checked, pre-op evaluation and timeout performed Spinal Block Patient position: sitting Prep: DuraPrep and site prepped and draped Patient monitoring: heart rate, cardiac monitor, continuous pulse ox and blood pressure Approach: right paramedian Location: L3-4 Injection technique: single-shot Needle Needle type: Pencan  Needle gauge: 24 G Needle length: 10 cm Needle insertion depth (cm): 5 Assessment Sensory level: T4 Events: CSF return  Additional Notes 2  Attempt (s). Pt tolerated procedure well.

## 2024-02-02 NOTE — Plan of Care (Signed)
" °  Problem: Education: Goal: Knowledge of the prescribed therapeutic regimen will improve Outcome: Progressing   Problem: Bowel/Gastric: Goal: Gastrointestinal status for postoperative course will improve Outcome: Progressing   Problem: Cardiac: Goal: Ability to maintain an adequate cardiac output Outcome: Progressing Goal: Will show no evidence of cardiac arrhythmias Outcome: Progressing   Problem: Nutritional: Goal: Will attain and maintain optimal nutritional status Outcome: Progressing   Problem: Neurological: Goal: Will regain or maintain usual level of consciousness Outcome: Progressing   Problem: Clinical Measurements: Goal: Ability to maintain clinical measurements within normal limits Outcome: Progressing Goal: Postoperative complications will be avoided or minimized Outcome: Progressing   Problem: Respiratory: Goal: Will regain and/or maintain adequate ventilation Outcome: Progressing Goal: Respiratory status will improve Outcome: Progressing   Problem: Skin Integrity: Goal: Demonstrates signs of wound healing without infection Outcome: Progressing   Problem: Urinary Elimination: Goal: Will remain free from infection Outcome: Progressing Goal: Ability to achieve and maintain adequate urine output Outcome: Progressing   Problem: Education: Goal: Knowledge of General Education information will improve Description: Including pain rating scale, medication(s)/side effects and non-pharmacologic comfort measures Outcome: Progressing   Problem: Health Behavior/Discharge Planning: Goal: Ability to manage health-related needs will improve Outcome: Progressing   Problem: Clinical Measurements: Goal: Ability to maintain clinical measurements within normal limits will improve Outcome: Progressing Goal: Will remain free from infection Outcome: Progressing Goal: Diagnostic test results will improve Outcome: Progressing Goal: Respiratory complications will  improve Outcome: Progressing Goal: Cardiovascular complication will be avoided Outcome: Progressing   Problem: Activity: Goal: Risk for activity intolerance will decrease Outcome: Progressing   Problem: Nutrition: Goal: Adequate nutrition will be maintained Outcome: Progressing   Problem: Coping: Goal: Level of anxiety will decrease Outcome: Progressing   Problem: Elimination: Goal: Will not experience complications related to bowel motility Outcome: Progressing Goal: Will not experience complications related to urinary retention Outcome: Progressing   Problem: Pain Managment: Goal: General experience of comfort will improve and/or be controlled Outcome: Progressing   Problem: Safety: Goal: Ability to remain free from injury will improve Outcome: Progressing   Problem: Skin Integrity: Goal: Risk for impaired skin integrity will decrease Outcome: Progressing   Problem: Education: Goal: Knowledge of the prescribed therapeutic regimen will improve Outcome: Progressing Goal: Understanding of discharge needs will improve Outcome: Progressing Goal: Individualized Educational Video(s) Outcome: Progressing   Problem: Activity: Goal: Ability to avoid complications of mobility impairment will improve Outcome: Progressing Goal: Ability to tolerate increased activity will improve Outcome: Progressing   Problem: Clinical Measurements: Goal: Postoperative complications will be avoided or minimized Outcome: Progressing   Problem: Pain Management: Goal: Pain level will decrease with appropriate interventions Outcome: Progressing   Problem: Skin Integrity: Goal: Will show signs of wound healing Outcome: Progressing   "

## 2024-02-02 NOTE — Discharge Instructions (Signed)

## 2024-02-03 ENCOUNTER — Encounter (HOSPITAL_COMMUNITY): Payer: Self-pay | Admitting: Orthopedic Surgery

## 2024-02-03 DIAGNOSIS — M1612 Unilateral primary osteoarthritis, left hip: Secondary | ICD-10-CM | POA: Diagnosis not present

## 2024-02-03 LAB — CBC
HCT: 29 % — ABNORMAL LOW (ref 36.0–46.0)
Hemoglobin: 9.5 g/dL — ABNORMAL LOW (ref 12.0–15.0)
MCH: 32.3 pg (ref 26.0–34.0)
MCHC: 32.8 g/dL (ref 30.0–36.0)
MCV: 98.6 fL (ref 80.0–100.0)
Platelets: 211 K/uL (ref 150–400)
RBC: 2.94 MIL/uL — ABNORMAL LOW (ref 3.87–5.11)
RDW: 13.9 % (ref 11.5–15.5)
WBC: 12.8 K/uL — ABNORMAL HIGH (ref 4.0–10.5)
nRBC: 0 % (ref 0.0–0.2)

## 2024-02-03 LAB — BASIC METABOLIC PANEL WITH GFR
Anion gap: 8 (ref 5–15)
BUN: 14 mg/dL (ref 8–23)
CO2: 26 mmol/L (ref 22–32)
Calcium: 9.9 mg/dL (ref 8.9–10.3)
Chloride: 106 mmol/L (ref 98–111)
Creatinine, Ser: 0.64 mg/dL (ref 0.44–1.00)
GFR, Estimated: >60 mL/min
Glucose, Bld: 141 mg/dL — ABNORMAL HIGH (ref 70–99)
Potassium: 4.2 mmol/L (ref 3.5–5.1)
Sodium: 140 mmol/L (ref 135–145)

## 2024-02-03 MED ORDER — METHOCARBAMOL 500 MG PO TABS: 500.0000 mg | ORAL_TABLET | Freq: Four times a day (QID) | ORAL | 2 refills | Status: AC | PRN

## 2024-02-03 MED ORDER — ASPIRIN 81 MG PO CHEW: 81.0000 mg | CHEWABLE_TABLET | Freq: Two times a day (BID) | ORAL | 0 refills | Status: AC

## 2024-02-03 MED ORDER — ACETAMINOPHEN 500 MG PO TABS: 1000.0000 mg | ORAL_TABLET | Freq: Four times a day (QID) | ORAL | Status: AC

## 2024-02-03 MED ORDER — OXYCODONE HCL 5 MG PO TABS: 5.0000 mg | ORAL_TABLET | ORAL | 0 refills | Status: AC | PRN

## 2024-02-03 MED ORDER — SENNA 8.6 MG PO TABS: 2.0000 | ORAL_TABLET | Freq: Every day | ORAL | 0 refills | Status: AC

## 2024-02-03 NOTE — Progress Notes (Signed)
 Physical Therapy Treatment Patient Details Name: Barbara [name] MRN: [id number] DOB: 1945-06-21 Today's Date: 02/03/2024   History of Present Illness 79 yo female presents to therapy s/p L THA, anterior approach on 02/02/2024 due to failure of conservative measures. Pt PMH includes but is not limited to: OSA, lumbar radiculopathy with sciatica, HTN, HLD, DOE, asthma, L wrist carpal tunnel syndrome, GERD, and anxiety.    PT Comments   PM tx session. Barbara [name] is a 79 y.o. female POD 1 s/p L THA. Patient reports IND with mobility at baseline. Patient is now limited by functional impairments (see PT problem list below) and requires S for transfers and gait with RW. Patient was able to ambulate 80 feet with RW and CGA and cues for safe walker management. Patient educated on safe sequencing for stair mobility with L handrail and HHA, fall risk prevention, use of RW, pain management and goal, use of CP/ice and slowly increasing activity level pt verbalized understanding. Patient instructed in exercises to facilitate ROM and circulation reviewed and HO provided. Patient will benefit from continued skilled PT interventions to address impairments and progress towards PLOF. Patient has met mobility goals at adequate level for discharge home with family support and HEP; will continue to follow if pt continues acute stay to progress towards Mod I goals.    If plan is discharge home, recommend the following: A little help with walking and/or transfers;A little help with bathing/dressing/bathroom;Assistance with cooking/housework;Help with stairs or ramp for entrance;Assist for transportation   Can travel by private vehicle        Equipment Recommendations  Rolling walker (2 wheels)    Recommendations for Other Services       Precautions / Restrictions Precautions Precautions: Fall Restrictions Weight Bearing Restrictions Per Provider Order: Yes LLE Weight Bearing Per Provider Order: Weight bearing  as tolerated     Mobility  Bed Mobility Overal bed mobility: Modified Independent Bed Mobility: Supine to Sit           General bed mobility comments: increased time and use of hospital bed freatures    Transfers Overall transfer level: Needs assistance Equipment used: Rolling walker (2 wheels) Transfers: Sit to/from Stand Sit to Stand: Supervision           General transfer comment: min cues for B UE and RW placement  for bed and recliner transfers    Ambulation/Gait Ambulation/Gait assistance: Supervision Gait Distance (Feet): 80 Feet Assistive device: Rolling walker (2 wheels) Gait Pattern/deviations: Step-to pattern, Decreased stance time - left, Antalgic, Trunk flexed Gait velocity: decreased     General Gait Details: slight trunk flexion with B UE support at RW to offload L LE in stance phase, lateral sway noted,  min cues for RW management, slow cadence with ambulation   Stairs Stairs: Yes Stairs assistance: Contact guard assist Stair Management: Two rails, One rail Left Number of Stairs: 3 General stair comments: step navigation with B handrail, cues for proper sequencing, technique and safety with pt able to progress to L handrail and HHA with CGA and cues as above   Wheelchair Mobility     Tilt Bed    Modified Rankin (Stroke Patients Only)       Balance Overall balance assessment: Needs assistance Sitting-balance support: Feet supported Sitting balance-Leahy Scale: Good     Standing balance support: Bilateral upper extremity supported, During functional activity, Reliant on assistive device for balance Standing balance-Leahy Scale: Poor  Communication Communication Communication: No apparent difficulties  Cognition Arousal: Alert Behavior During Therapy: WFL for tasks assessed/performed   PT - Cognitive impairments: No apparent impairments                         Following commands:  Intact      Cueing    Exercises Total Joint Exercises Ankle Circles/Pumps: AROM, Both, 20 reps Quad Sets: AROM, Left, 5 reps Heel Slides: AROM, Left, 5 reps Hip ABduction/ADduction: AROM, 5 reps, Standing, Left Long Arc Quad: AROM, Left, 5 reps Knee Flexion: AROM, Left, 5 reps, Standing Standing Hip Extension: AROM, Left, 5 reps, Standing    General Comments        Pertinent Vitals/Pain Pain Assessment Pain Assessment: 0-10 Pain Score: 3  Pain Location: L hip and LE Pain Descriptors / Indicators: Aching, Constant, Sore Pain Intervention(s): Limited activity within patient's tolerance, Monitored during session, Premedicated before session, Repositioned, Ice applied    Home Living                          Prior Function            PT Goals (current goals can now be found in the care plan section) Acute Rehab PT Goals Patient Stated Goal: to be able to get stronger and do what I want to no pain PT Goal Formulation: With patient Time For Goal Achievement: 02/16/24 Potential to Achieve Goals: Good Progress towards PT goals: Progressing toward goals    Frequency    7X/week      PT Plan      Co-evaluation              AM-PAC PT 6 Clicks Mobility   Outcome Measure  Help needed turning from your back to your side while in a flat bed without using bedrails?: None Help needed moving from lying on your back to sitting on the side of a flat bed without using bedrails?: None Help needed moving to and from a bed to a chair (including a wheelchair)?: A Little Help needed standing up from a chair using your arms (e.g., wheelchair or bedside chair)?: A Little Help needed to walk in hospital room?: A Little Help needed climbing 3-5 steps with a railing? : A Little 6 Click Score: 20    End of Session Equipment Utilized During Treatment: Gait belt Activity Tolerance: Patient tolerated treatment well;No increased pain Patient left: in chair;with call  bell/phone within reach;with family/visitor present Nurse Communication: Mobility status (pt readiness for d/c from PT standpoint) PT Visit Diagnosis: Unsteadiness on feet (R26.81);Other abnormalities of gait and mobility (R26.89);Muscle weakness (generalized) (M62.81);Difficulty in walking, not elsewhere classified (R26.2);Pain Pain - Right/Left: Left Pain - part of body: Leg;Hip     Time: 8584-8561 PT Time Calculation (min) (ACUTE ONLY): 23 min  Charges:    $Gait Training: 8-22 mins $Therapeutic Exercise: 8-22 mins $Therapeutic Activity: 8-22 mins PT General Charges $$ ACUTE PT VISIT: 1 Visit                     [place], PT Acute Rehab    [place] VEAR Drone 02/03/2024, 3:46 PM

## 2024-02-03 NOTE — Progress Notes (Signed)
 Patient ID: Barbara [name], female   DOB: Jul 12, 1945, 79 y.o.   MRN: [id number] Subjective: 1 Day Post-Op Procedures (LRB): ARTHROPLASTY, HIP, TOTAL, ANTERIOR APPROACH (Left)    Patient reports pain as mild to moderate at this point No events over night  Objective:   VITALS:   Vitals:   02/03/24 0110 02/03/24 0501  BP: (!) 103/50 (!) 101/55  Pulse: 60 (!) 54  Resp: 17 17  Temp: 97.8 F (36.6 C) 97.6 F (36.4 C)  SpO2: 98% 98%    Neurovascular intact Incision: dressing C/D/I  LABS Recent Labs    02/03/24 0312  HGB 9.5*  HCT 29.0*  WBC 12.8*  PLT 211    Recent Labs    02/03/24 0312  NA 140  K 4.2  BUN 14  CREATININE 0.64  GLUCOSE 141*    No results for input(s): LABPT, INR in the last 72 hours.   Assessment/Plan: 1 Day Post-Op Procedures (LRB): ARTHROPLASTY, HIP, TOTAL, ANTERIOR APPROACH (Left)   Advance diet Up with therapy Home today after therapy Will send meds to her pharmacy RTC in 2 weeks

## 2024-02-03 NOTE — Progress Notes (Signed)
 Physical Therapy Treatment Patient Details Name: Barbara [name] MRN: [id number] DOB: [birth year] Today's Date: 02/03/2024   History of Present Illness 79 yo female presents to therapy s/p L THA, anterior approach on 02/02/2024 due to failure of conservative measures. Pt PMH includes but is not limited to: OSA, lumbar radiculopathy with sciatica, HTN, HLD, DOE, asthma, L wrist carpal tunnel syndrome, GERD, and anxiety.    PT Comments  POD #1 Pt admitted with above diagnosis.  Pt currently with functional limitations due to the deficits listed below (see PT Problem List). Pt in bed when PT arrived. Feeling much better, no nausea and limited pain response. Mod I with increased time and use of hospital bed features for supine to sit, S for transfer tasks to a variety of surfaces with RW, RW adjusted to appropriate height, PT guided pt through supine and seated LE TE for HEP and ambulation training in hallway and personal room with RW and close S, cues and decreased cadence. Pt left seated in recliner and all needs in place. PT to return later in day for PM session for step navigation, gait training and LE HEP. Pt will benefit from acute skilled PT to increase their independence and safety with mobility to allow discharge.      If plan is discharge home, recommend the following: A little help with walking and/or transfers;A little help with bathing/dressing/bathroom;Assistance with cooking/housework;Help with stairs or ramp for entrance;Assist for transportation   Can travel by private vehicle        Equipment Recommendations  Rolling walker (2 wheels) (youth?)    Recommendations for Other Services       Precautions / Restrictions Precautions Precautions: Fall Restrictions Weight Bearing Restrictions Per Provider Order: Yes LLE Weight Bearing Per Provider Order: Weight bearing as tolerated     Mobility  Bed Mobility Overal bed mobility: Modified Independent             General bed  mobility comments: increased time and use of hospital bed freatures    Transfers Overall transfer level: Needs assistance Equipment used: Rolling walker (2 wheels) Transfers: Sit to/from Stand Sit to Stand: Supervision           General transfer comment: min cues for bed, commode and recliner transfers    Ambulation/Gait Ambulation/Gait assistance: Supervision Gait Distance (Feet): 120 Feet Assistive device: Rolling walker (2 wheels) Gait Pattern/deviations: Step-to pattern, Decreased stance time - left, Antalgic, Trunk flexed Gait velocity: decreased     General Gait Details: slight trunk flexion with B UE support at RW to offload L LE in stance phase, lateral sway noted,  min cues for RW management, slow cadence with ambulation tasks in personal room and in hallway   Stairs             Wheelchair Mobility     Tilt Bed    Modified Rankin (Stroke Patients Only)       Balance Overall balance assessment: Needs assistance Sitting-balance support: Feet supported Sitting balance-Leahy Scale: Good     Standing balance support: Bilateral upper extremity supported, During functional activity, Reliant on assistive device for balance Standing balance-Leahy Scale: Poor                              Communication Communication Communication: No apparent difficulties  Cognition Arousal: Alert Behavior During Therapy: WFL for tasks assessed/performed   PT - Cognitive impairments: No apparent impairments  Following commands: Intact      Cueing    Exercises Total Joint Exercises Ankle Circles/Pumps: AROM, Both, 20 reps Quad Sets: AROM, Left, 5 reps Heel Slides: AROM, Left, 5 reps Long Arc Quad: AROM, Left, 5 reps    General Comments        Pertinent Vitals/Pain Pain Assessment Pain Assessment: 0-10 Pain Score: 1  Pain Location: L hip and LE (back and R eye) Pain Descriptors / Indicators: Aching, Constant,  Sore Pain Intervention(s): Limited activity within patient's tolerance, Monitored during session, Repositioned, Ice applied    Home Living                          Prior Function            PT Goals (current goals can now be found in the care plan section) Acute Rehab PT Goals Patient Stated Goal: to be able to get stronger and do what I want to no pain PT Goal Formulation: With patient Time For Goal Achievement: 02/16/24 Potential to Achieve Goals: Good Progress towards PT goals: Progressing toward goals    Frequency    7X/week      PT Plan      Co-evaluation              AM-PAC PT 6 Clicks Mobility   Outcome Measure  Help needed turning from your back to your side while in a flat bed without using bedrails?: None Help needed moving from lying on your back to sitting on the side of a flat bed without using bedrails?: None Help needed moving to and from a bed to a chair (including a wheelchair)?: A Little Help needed standing up from a chair using your arms (e.g., wheelchair or bedside chair)?: A Little Help needed to walk in hospital room?: A Little Help needed climbing 3-5 steps with a railing? : A Lot 6 Click Score: 19    End of Session Equipment Utilized During Treatment: Gait belt Activity Tolerance: Patient tolerated treatment well;No increased pain Patient left: in chair;with call bell/phone within reach Nurse Communication: Mobility status PT Visit Diagnosis: Unsteadiness on feet (R26.81);Other abnormalities of gait and mobility (R26.89);Muscle weakness (generalized) (M62.81);Difficulty in walking, not elsewhere classified (R26.2);Pain Pain - Right/Left: Left Pain - part of body: Leg;Hip     Time: 8982-8944 PT Time Calculation (min) (ACUTE ONLY): 38 min  Charges:    $Gait Training: 8-22 mins $Therapeutic Exercise: 8-22 mins $Therapeutic Activity: 8-22 mins PT General Charges $$ ACUTE PT VISIT: 1 Visit                      [place], PT Acute Rehab    [place] VEAR Drone 02/03/2024, 1:21 PM

## 2024-02-03 NOTE — TOC Transition Note (Signed)
 Transition of Care [facility]) - Discharge Note   Patient Details  Name: Barbara [name] MRN: [id number] Date of Birth: [birth year]  [facility] CM/SW Contact:  NORMAN ASPEN, LCSW Phone Number: 02/03/2024, 9:19 AM   Clinical Narrative:     Met with the patient and confirming she has received RW to room via [employer].  Plan for HEP.  No further IP CM needs.  Final next level of care: Home/Self Care Barriers to Discharge: No Barriers Identified   Patient Goals and CMS Choice Patient states their goals for this hospitalization and ongoing recovery are:: return home          Discharge Placement                       Discharge Plan and Services Additional resources added to the After Visit Summary for                  DME Arranged: Walker rolling DME Agency: [employer]                  Social Drivers of Health (SDOH) Interventions SDOH Screenings   Food Insecurity: No Food Insecurity (02/02/2024)  Housing: Low Risk (02/02/2024)  Transportation Needs: No Transportation Needs (02/02/2024)  Utilities: Not At Risk (02/02/2024)  Financial Resource Strain: Low Risk (07/16/2022)   Received from [facility]  Physical Activity: Insufficiently Active (07/16/2022)   Received from [facility]  Social Connections: Moderately Integrated (02/02/2024)  Stress: No Stress Concern Present (07/16/2022)   Received from [facility]  Tobacco Use: Low Risk (02/02/2024)  Health Literacy: Medium Risk (07/16/2022)   Received from [facility]     Readmission Risk Interventions     No data to display

## 2024-02-03 NOTE — Progress Notes (Signed)
 Discharge instructions given to patient and family questions asked and answered.

## 2024-02-11 NOTE — Discharge Summary (Signed)
 Patient ID: Barbara [name] MRN: [id number] DOB/AGE: May 27, 1945 79 y.o.  Admit date: 02/02/2024 Discharge date: 02/03/2024  Admission Diagnoses:  Left hip osteoarthritis  Discharge Diagnoses:  Principal Problem:   S/P total left hip arthroplasty   Past Medical History:  Diagnosis Date   Anxiety    Arthritis    Asthma    Bladder spasms    DDD (degenerative disc disease)    Depression    GERD (gastroesophageal reflux disease)    History of kidney stones    Hypercholesteremia    Hypertension    Sciatic nerve disease    Sleep apnea     Surgeries: Procedures: ARTHROPLASTY, HIP, TOTAL, ANTERIOR APPROACH on 02/02/2024   Consultants:   Discharged Condition: Improved  Hospital Course: Barbara [name] is an 79 y.o. female who was admitted 02/02/2024 for operative treatment ofS/P total left hip arthroplasty. Patient has severe unremitting pain that affects sleep, daily activities, and work/hobbies. After pre-op clearance the patient was taken to the operating room on 02/02/2024 and underwent  Procedures: ARTHROPLASTY, HIP, TOTAL, ANTERIOR APPROACH.    Patient was given perioperative antibiotics:  Anti-infectives (From admission, onward)    Start     Dose/Rate Route Frequency Ordered Stop   02/02/24 1930  ceFAZolin  (ANCEF ) IVPB 2g/100 mL premix        2 g 200 mL/hr over 30 Minutes Intravenous Every 6 hours 02/02/24 1700 02/03/24 0807   02/02/24 1100  ceFAZolin  (ANCEF ) IVPB 2g/100 mL premix        2 g 200 mL/hr over 30 Minutes Intravenous On call to O.R. 02/02/24 1057 02/02/24 1341        Patient was given sequential compression devices, early ambulation, and chemoprophylaxis to prevent DVT. Patient worked with PT and was meeting their goals regarding safe ambulation and transfers.  Patient benefited maximally from hospital stay and there were no complications.    Recent vital signs: No data found.   Recent laboratory studies: No results for input(s): WBC, HGB, HCT,  PLT, NA, K, CL, CO2, BUN, CREATININE, GLUCOSE, INR, CALCIUM  in the last 72 hours.  Invalid input(s): PT, 2   Discharge Medications:   Allergies as of 02/03/2024       Reactions   Bee Venom Swelling   Codeine    Chest pain   Penicillins Rash   Tolerated Cephalosporin Date: 02/03/2024.        Medication List     STOP taking these medications    aspirin  EC 81 MG tablet Replaced by: aspirin  81 MG chewable tablet       TAKE these medications    acetaminophen  500 MG tablet Commonly known as: TYLENOL  Take 2 tablets (1,000 mg total) by mouth every 6 (six) hours.   albuterol  108 (90 Base) MCG/ACT inhaler Commonly known as: VENTOLIN  HFA Inhale 2 puffs into the lungs every 4 (four) hours as needed.   aspirin  81 MG chewable tablet Chew 1 tablet (81 mg total) by mouth 2 (two) times daily for 28 days. Replaces: aspirin  EC 81 MG tablet   atorvastatin  80 MG tablet Commonly known as: LIPITOR Take 120 mg by mouth daily.   fluticasone 110 MCG/ACT inhaler Commonly known as: FLOVENT HFA Inhale 2 puffs into the lungs 2 (two) times daily as needed (Asthma).   furosemide  20 MG tablet Commonly known as: LASIX  Take 20 mg by mouth daily.   losartan  25 MG tablet Commonly known as: COZAAR  Take 25 mg by mouth daily.   methocarbamol  500 MG tablet  Commonly known as: ROBAXIN  Take 1 tablet (500 mg total) by mouth every 6 (six) hours as needed for muscle spasms.   Myrbetriq  50 MG Tb24 tablet Generic drug: mirabegron  ER Take 50 mg by mouth daily.   oxyCODONE  5 MG immediate release tablet Commonly known as: Oxy IR/ROXICODONE  Take 1 tablet (5 mg total) by mouth every 4 (four) hours as needed for severe pain (pain score 7-10).   polyethylene glycol 17 g packet Commonly known as: MIRALAX  / GLYCOLAX  Take 17 g by mouth daily as needed for moderate constipation.   senna 8.6 MG Tabs tablet Commonly known as: SENOKOT Take 2 tablets (17.2 mg total) by mouth at  bedtime for 14 days.   Venlafaxine  HCl 150 MG Tb24 Take 150 mg by mouth daily.               Discharge Care Instructions  (From admission, onward)           Start     Ordered   02/03/24 0000  Change dressing       Comments: Maintain surgical dressing until follow up in the clinic. If the edges start to pull up, may reinforce with tape. If the dressing is no longer working, may remove and cover with gauze and tape, but must keep the area dry and clean.  Call with any questions or concerns.   02/03/24 0743            Diagnostic Studies: DG Pelvis Portable Result Date: 02/02/2024 EXAM: 1 or 2 VIEW(S) XRAY OF THE PELVIS 02/02/2024 03:32:00 PM COMPARISON: None available. CLINICAL HISTORY: S/P total hip arthroplasty. FINDINGS: BONES AND JOINTS: Left hip arthroplasty in place. Moderate degenerative changes of the right hip. No acute fracture. SOFT TISSUES: Soft tissue gas along the lateral left hip compatible with recent surgery . IMPRESSION: 1. Left hip arthroplasty in place. 2. Moderate degenerative changes of the right hip. Electronically signed by: Greig Pique MD 02/02/2024 09:18 PM EST RP Workstation: [id number]   DG HIP UNILAT WITH PELVIS 1V LEFT Result Date: 02/02/2024 EXAM: 1 VIEW(S) XRAY OF THE LEFT HIP 02/02/2024 02:41:00 PM COMPARISON: None available. CLINICAL HISTORY: [id number] Surgery, elective [id number] Surgery, elective [phone] FINDINGS: BONES AND JOINTS: Intraoperative spot images demonstrate changes in the left hip replacement. No hardware complication. SOFT TISSUES: Unremarkable. IMPRESSION: 1. Intraoperative imaging as above. Electronically signed by: Franky Crease MD 02/02/2024 09:11 PM EST RP Workstation: [id number]   DG C-Arm 1-60 Min-No Report Result Date: 02/02/2024 Fluoroscopy was utilized by the requesting physician.  No radiographic interpretation.   DG C-Arm 1-60 Min-No Report Result Date: 02/02/2024 Fluoroscopy was utilized by the requesting physician.  No  radiographic interpretation.    Disposition: Discharge disposition: 01-Home or Self Care       Discharge Instructions     Call MD / Call 911   Complete by: As directed    If you experience chest pain or shortness of breath, CALL 911 and be transported to the hospital emergency room.  If you develope a fever above 101 F, pus (white drainage) or increased drainage or redness at the wound, or calf pain, call your surgeon's office.   Change dressing   Complete by: As directed    Maintain surgical dressing until follow up in the clinic. If the edges start to pull up, may reinforce with tape. If the dressing is no longer working, may remove and cover with gauze and tape, but must keep the area dry and clean.  Call with any questions  or concerns.   Constipation Prevention   Complete by: As directed    Drink plenty of fluids.  Prune juice may be helpful.  You may use a stool softener, such as Colace (over the counter) 100 mg twice a day.  Use MiraLax  (over the counter) for constipation as needed.   Diet - low sodium heart healthy   Complete by: As directed    Increase activity slowly as tolerated   Complete by: As directed    Weight bearing as tolerated with assist device (walker, cane, etc) as directed, use it as long as suggested by your surgeon or therapist, typically at least 4-6 weeks.   Post-operative opioid taper instructions:   Complete by: As directed    POST-OPERATIVE OPIOID TAPER INSTRUCTIONS: It is important to wean off of your opioid medication as soon as possible. If you do not need pain medication after your surgery it is ok to stop day one. Opioids include: Codeine, Hydrocodone(Norco, Vicodin), Oxycodone (Percocet, oxycontin ) and hydromorphone  amongst others.  Long term and even short term use of opiods can cause: Increased pain response Dependence Constipation Depression Respiratory depression And more.  Withdrawal symptoms can include Flu like symptoms Nausea,  vomiting And more Techniques to manage these symptoms Hydrate well Eat regular healthy meals Stay active Use relaxation techniques(deep breathing, meditating, yoga) Do Not substitute Alcohol to help with tapering If you have been on opioids for less than two weeks and do not have pain than it is ok to stop all together.  Plan to wean off of opioids This plan should start within one week post op of your joint replacement. Maintain the same interval or time between taking each dose and first decrease the dose.  Cut the total daily intake of opioids by one tablet each day Next start to increase the time between doses. The last dose that should be eliminated is the evening dose.      TED hose   Complete by: As directed    Use stockings (TED hose) for 2 weeks on both leg(s).  You may remove them at night for sleeping.        Follow-up Information     Patti Rosina SAUNDERS, PA-C. Go on 02/17/2024.   Specialty: Orthopedic Surgery Why: You are scheduled for a post op appointment on Wednesday 02/17/24 at 10:00am Contact information: [address] [place] [address] [phone]                  Signed: Rosina SAUNDERS Patti 02/11/2024, 7:00 AM

## 2024-02-16 NOTE — Progress Notes (Incomplete)
 "      Memory Impairment   Barbara [name] is a very pleasant 79 y.o. year old RH female with a history of hypertension, hyperlipidemia, hypercalcemia, thyroid  nodule, OSA on CPAP anxiety seen today for evaluation of memory loss. MoCA today is ***. Etiology is ***. Patient is able to participate on ADLs and to to drive without difficulties. Mood is *** . Patient is accompanied by *** who supplement the history.  Follow up in *** months  *** pending on the above results  MRI brain to further evaluate for structural abnormalities and vascular load  Neuropsychological evaluation for further investigate other causes of memory loss including sleep, attention, anxiety, depression among others Monitored with the intake of pain medications and muscle relaxers Check B12     Discussed the use of AI scribe software for clinical note transcription with the patient, who gave verbal consent to proceed.  History of Present Illness      How long did patient have memory difficulties?  For about.  Patient reports some difficulty remembering new information, recent conversations, names. repeats oneself?  Endorsed Disoriented when walking into a room? Denies ***  Leaving objects in unusual places?  Denies.   Wandering behavior? Denies.   Any personality changes, or depression, anxiety? Denies *** Hallucinations or paranoia? Denies.   Seizures? Denies.    Any sleep changes?  Sleeps well *** Does not sleep well. **  frequent nightmares or dream reenactment, other REM behavior or sleepwalking   Sleep apnea?  Yes, OSA on CPAP Any hygiene concerns?  Denies.   Independent of bathing and dressing? Endorsed  Does the patient need help with medications?  is in charge *** Who is in charge of the finances?  is in charge   *** Any changes in appetite?   Denies. ***   Patient have trouble swallowing?  Denies.   Does the patient cook? No*** yes, denies forgetting common recipes or kitchen accidents *** Any  headaches?  Denies.   Chronic pain?  And [place], attributed to arthritis.  She had recent total hip arthroplasty 02/02/2024, currently on PT Ambulates with difficulty? Denies. ***  Needs a cane*** Needs a walker *** to ambulate for stability.   Recent falls or head injuries? Denies.     Vision changes?  Denies any new issues.  Has a history of*** Any strokelike symptoms? Denies.   Any tremors? Denies. *** Any anosmia? Denies.   Any incontinence of urine?  She has a history of bladder spasms*** Any bowel dysfunction? Denies.      Patient lives with ***  History of heavy alcohol  intake? Denies.   History of heavy tobacco use?  Never Family history of dementia?   *** with dementia  Does patient drive? No longer drives  *** yes, denies getting lost.***  Recent labs October 2025 TSH 2.931   Allergies[1]  Current Outpatient Medications  Medication Instructions   acetaminophen  (TYLENOL ) 1,000 mg, Oral, Every 6 hours   albuterol  (VENTOLIN  HFA) 108 (90 Base) MCG/ACT inhaler 2 puffs, Inhalation, Every 4 hours PRN   aspirin  81 mg, Oral, 2 times daily   atorvastatin  (LIPITOR) 120 mg, Daily   fluticasone (FLOVENT HFA) 110 MCG/ACT inhaler 2 puffs, Inhalation, 2 times daily PRN   furosemide  (LASIX ) 20 mg, Oral, Daily   losartan  (COZAAR ) 25 mg, Oral, Daily   methocarbamol  (ROBAXIN ) 500 mg, Oral, Every 6 hours PRN   Myrbetriq  50 mg, Oral, Daily   oxyCODONE  (OXY IR/ROXICODONE ) 5 mg, Oral, Every 4  hours PRN   polyethylene glycol (MIRALAX  / GLYCOLAX ) 17 g, Daily PRN   senna (SENOKOT) 17.2 mg, Oral, Daily at bedtime   Venlafaxine  HCl 150 mg, Oral, Daily    VITALS:  There were no vitals filed for this visit.   Neurological Exam      No data to display              No data to display            Orientation:  Alert and oriented to person, place and not to time***. No aphasia or dysarthria. Fund of knowledge is appropriate. Recent and remote memory impaired.  Attention and concentration are  reduced***.  Able to name objects and repeat phrases. *** Delayed recall  /5 .*** Cranial nerves: There is good facial symmetry. Extraocular muscles are intact and visual fields are full to confrontational testing. Speech is fluent and clear. No tongue deviation. Hearing is intact to conversational tone.*** Tone: Tone is good throughout. Sensation: Sensation is intact to light touch.  Vibration is intact at the bilateral big toe.  Coordination: The patient has no difficulty with RAM's or FNF bilaterally. Normal finger to nose  Motor: Strength is 5/5 in the bilateral upper and lower extremities. There is no pronator drift. There are no fasciculations noted. DTR's: Deep tendon reflexes are 2/4 bilaterally. Gait and Station: The patient is able to ambulate without difficulty. Gait is cautious and narrow. Stride length is normal. ***      Thank you for allowing us  the opportunity to participate in the care of this nice patient. Please do not hesitate to contact us  for any questions or concerns.   Total time spent on today's visit was *** minutes dedicated to this patient today, preparing to see patient, examining the patient, ordering tests and/or medications and counseling the patient, documenting clinical information in the EHR or other health record, independently interpreting results and communicating results to the patient/family, discussing treatment and goals, answering patient's questions and coordinating care.  Cc:  Trudy Vaughn FALCON, MD  Camie Sevin 02/16/2024 6:57 AM       [1]  Allergies Allergen Reactions   Bee Venom Swelling   Codeine     Chest pain   Penicillins Rash    Tolerated Cephalosporin Date: 02/03/2024.     "

## 2024-02-18 ENCOUNTER — Telehealth: Payer: Self-pay | Admitting: Physician Assistant

## 2024-02-18 ENCOUNTER — Other Ambulatory Visit (HOSPITAL_COMMUNITY)

## 2024-02-18 NOTE — Telephone Encounter (Signed)
 Pt called in this morning and she wants to know what type of scan she suppose to be getting. Wants to know has the scan been ordered as well. Thanks

## 2024-02-18 NOTE — Telephone Encounter (Signed)
 No answer at 12:00 02/18/2024

## 2024-02-19 ENCOUNTER — Ambulatory Visit: Admitting: Physician Assistant

## 2024-02-19 ENCOUNTER — Ambulatory Visit

## 2024-02-26 ENCOUNTER — Telehealth: Payer: Self-pay | Admitting: Nurse Practitioner

## 2024-02-26 NOTE — Telephone Encounter (Signed)
 You can overbook, that's fine

## 2024-02-26 NOTE — Telephone Encounter (Signed)
 Pt needs to move 03/09/24 appt due to ultrasound on 03/14/24.  Do I need to overbook to get her back in or next available?

## 2024-02-29 ENCOUNTER — Ambulatory Visit (HOSPITAL_COMMUNITY)

## 2024-03-01 NOTE — Telephone Encounter (Signed)
 BOOKED.

## 2024-03-02 ENCOUNTER — Ambulatory Visit: Admitting: Nurse Practitioner

## 2024-03-09 ENCOUNTER — Ambulatory Visit: Admitting: Nurse Practitioner

## 2024-03-14 ENCOUNTER — Ambulatory Visit (HOSPITAL_COMMUNITY)

## 2024-03-25 ENCOUNTER — Ambulatory Visit: Admitting: Nurse Practitioner
